# Patient Record
Sex: Male | Born: 1955
Health system: Southern US, Community
[De-identification: ages and names within clinical notes are randomized; demographics above are authoritative.]

## PROBLEM LIST (undated history)

## (undated) ENCOUNTER — Emergency Department (HOSPITAL_COMMUNITY): Disposition: A | Discharge status: Other Acute Inpatient Hospital | Payer: Medicaid Other

## (undated) DIAGNOSIS — G629 Polyneuropathy, unspecified: Secondary | ICD-10-CM

## (undated) DIAGNOSIS — B1920 Unspecified viral hepatitis C without hepatic coma: Secondary | ICD-10-CM

## (undated) DIAGNOSIS — M25512 Pain in left shoulder: Principal | ICD-10-CM

## (undated) DIAGNOSIS — F329 Major depressive disorder, single episode, unspecified: Secondary | ICD-10-CM

## (undated) DIAGNOSIS — M1990 Unspecified osteoarthritis, unspecified site: Secondary | ICD-10-CM

## (undated) DIAGNOSIS — G4700 Insomnia, unspecified: Secondary | ICD-10-CM

## (undated) DIAGNOSIS — C801 Malignant (primary) neoplasm, unspecified: Secondary | ICD-10-CM

## (undated) DIAGNOSIS — F1020 Alcohol dependence, uncomplicated: Secondary | ICD-10-CM

## (undated) DIAGNOSIS — G8929 Other chronic pain: Secondary | ICD-10-CM

## (undated) DIAGNOSIS — M8580 Other specified disorders of bone density and structure, unspecified site: Secondary | ICD-10-CM

## (undated) DIAGNOSIS — I10 Essential (primary) hypertension: Secondary | ICD-10-CM

## (undated) DIAGNOSIS — M25562 Pain in left knee: Secondary | ICD-10-CM

## (undated) DIAGNOSIS — F32A Depression, unspecified: Secondary | ICD-10-CM

## (undated) DIAGNOSIS — S8992XA Unspecified injury of left lower leg, initial encounter: Secondary | ICD-10-CM

## (undated) DIAGNOSIS — F1021 Alcohol dependence, in remission: Secondary | ICD-10-CM

## (undated) DIAGNOSIS — F17200 Nicotine dependence, unspecified, uncomplicated: Secondary | ICD-10-CM

## (undated) HISTORY — PX: OTHER SURGICAL HISTORY: SHX169

## (undated) HISTORY — DX: Nicotine dependence, unspecified, uncomplicated: F17.200

## (undated) HISTORY — PX: KNEE RECONSTRUCTION: SHX5883

## (undated) HISTORY — DX: Alcohol dependence, in remission: F10.21

## (undated) HISTORY — DX: Other chronic pain: G89.29

## (undated) HISTORY — DX: Pain in left knee: M25.562

## (undated) HISTORY — DX: Pain in left shoulder: M25.512

---

## 2002-08-10 ENCOUNTER — Inpatient Hospital Stay (HOSPITAL_COMMUNITY): Admission: EM | Admit: 2002-08-10 | Discharge: 2002-08-15 | Payer: Self-pay | Admitting: Psychiatry

## 2002-08-10 ENCOUNTER — Emergency Department (HOSPITAL_COMMUNITY): Admission: EM | Admit: 2002-08-10 | Discharge: 2002-08-10 | Payer: Self-pay | Admitting: Emergency Medicine

## 2003-06-01 ENCOUNTER — Emergency Department (HOSPITAL_COMMUNITY): Admission: EM | Admit: 2003-06-01 | Discharge: 2003-06-01 | Payer: Self-pay

## 2005-05-01 ENCOUNTER — Emergency Department (HOSPITAL_COMMUNITY): Admission: EM | Admit: 2005-05-01 | Discharge: 2005-05-01 | Payer: Self-pay | Admitting: Emergency Medicine

## 2007-05-08 ENCOUNTER — Emergency Department (HOSPITAL_COMMUNITY): Admission: EM | Admit: 2007-05-08 | Discharge: 2007-05-08 | Payer: Self-pay | Admitting: Emergency Medicine

## 2010-09-03 ENCOUNTER — Emergency Department (HOSPITAL_COMMUNITY): Admission: EM | Admit: 2010-09-03 | Discharge: 2010-09-03 | Payer: Self-pay | Admitting: Family Medicine

## 2010-09-05 ENCOUNTER — Emergency Department (HOSPITAL_COMMUNITY): Admission: EM | Admit: 2010-09-05 | Discharge: 2010-09-05 | Payer: Self-pay | Admitting: Emergency Medicine

## 2011-04-09 NOTE — Discharge Summary (Signed)
NAME:  Kyle Mejia, CLINGERMAN NO.:  [id number]   MEDICAL RECORD NO.:  [id number]                   PATIENT TYPE:  IPS   LOCATION:  0504                                 FACILITY:  [facility]   PHYSICIAN:  Jeanice Lim, M.D.              DATE OF BIRTH:  05/18/1956   DATE OF ADMISSION:  08/10/2002  DATE OF DISCHARGE:  08/15/2002                                 DISCHARGE SUMMARY   IDENTIFYING DATA:  This is a 55 year old divorced male admitted for detox,  drinking a fifth of vodka per day and also describing depressive symptoms  and decreased appetite and sleep.   MEDICATIONS:  None.   ALLERGIES:  No known drug allergies.   PHYSICAL EXAMINATION:  Essentially within normal limits.  Neurologically  nonfocal.   LABORATORY DATA:  Routine admission labs essentially within normal limits.   MENTAL STATUS EXAM:  Strongly-built white male with good eye contact.  Speech within normal limits.  Mood depressed.  Affect blunted.  Thought  process goal directed.  Thought content negative for suicidal or homicidal  ideation or psychotic symptoms.  Cognitively intact.  Judgment and insight  fair.   ADMISSION DIAGNOSES:   AXIS I:  1. Depressive disorder not otherwise specified.  2. Alcohol dependence.   AXIS II:  None.   AXIS III:  Left knee pain.   AXIS IV:  Moderate (problems related to limited support system and medical  problems).   AXIS V:  40/70.   HOSPITAL COURSE:  The patient was admitted and ordered routine p.r.n.  medications and underwent further monitoring.  He was encouraged to  participate in individual, group and milieu therapy.  The patient was placed  on phenobarbital detox protocol with monitoring for safe withdrawal and  started on Lexapro targeting depressive symptoms.  The patient tolerated  detox without complications, participated fully in treatment programming and  reported some improvement of mood.   CONDITION ON DISCHARGE:  Markedly  improved.  Mood was more euthymic.  Affect  brighter.  Thought processes goal directed.  Thought content negative for  dangerous ideation or psychotic symptoms.  The patient denied any acute  withdrawal symptoms and reported motivation to be abstinent from alcohol.   DISCHARGE MEDICATIONS:  1. Trazodone 100 mg, 2 q.h.s.  2. Lexapro 10 mg q.a.m.   FOLLOW UP:  Dr. Lourdes Sledge at [facility] on August 28, 2002 at  3:30 p.m. and Glendell Docker on August 27, 2002 at 11 a.m.   DISCHARGE DIAGNOSES:   AXIS I:  1. Depressive disorder not otherwise specified.  2. Alcohol dependence.   AXIS II:  None.   AXIS III:  Left knee pain.   AXIS IV:  Moderate (problems related to limited support system and medical  problems).   AXIS V:  Global Assessment of Functioning on discharge 55.  Jeanice Lim, M.D.    JEM/MEDQ  D:  10/01/2002  T:  10/01/2002  Job:  [id number]

## 2011-04-09 NOTE — H&P (Signed)
NAME:  Kyle Mejia, Kyle Mejia NO.:  [id number]   MEDICAL RECORD NO.:  [id number]                   PATIENT TYPE:  IPS   LOCATION:  0504                                 FACILITY:  [facility]   PHYSICIAN:  Hipolito Bayley, M.D.               DATE OF BIRTH:  December 04, 1955   DATE OF ADMISSION:  08/10/2002  DATE OF DISCHARGE:                         PSYCHIATRIC ADMISSION ASSESSMENT   INTRODUCTION:  The patient is a 55 year old white divorced male who was  admitted on voluntary papers with chief complaint of I want to stop  drinking.   REASON FOR ADMISSION AND SYMPTOMS:  For several years, patient gradually  increased amount of drinking.  Most recently, he drinks up to one-fifth of  vodka per day and he sees how this habit started affecting his job  performance.  He also complained of feeling depressed, down, no fun, no  energy and no motivation to do things.  Denies hallucinations.  Denied  suicidal or homicidal thoughts.  Denied any psychotic experience.  Complained of decreased appetite and sleep.   PAST PSYCHIATRIC HISTORY:  The patient has never been psychiatrically  hospitalized.  In the past, eight years ago, he was in a detoxification and  rehabilitation program and was able to stay sober for several years.  The  patient does not have history of suicidal behavior and no history of  psychiatric treatment.   SOCIAL HISTORY:  The patient is divorced with no children.  He works as an  Personnel officer.  He comes from a loving, close-knit family.  He has two older  sisters who are very supportive for his effort to become sober again.   FAMILY HISTORY:  Negative for mental illness or drug and alcohol problems.   ALCOHOL/DRUG HISTORY:  The patient started drinking on and off since early  20s.  He has history of alcohol withdrawal.  He never did any drugs.   MEDICAL HISTORY:  The patient is under care of family practice.  He has bad  left knee with recurrent pain  requiring further diagnosis.  He kept  postponing dealing with this problem due to his alcohol problems.   MEDICATIONS:  He takes over-the-counter pain relievers.   ALLERGIES:  He is not allergic to any known medication.   POSITIVE PHYSICAL FINDINGS:  Normal vital signs.  Physical examination, done  in the emergency department, was normal.   LABORATORY DATA:  Basic lab work was normal.   MENTAL STATUS EXAM:  Strongly-built white male with good eye contact.  Normal speech.  Denies hallucinations.  Mood depressed.  Affect blunted.  Thoughts organized, goal directed.  Content did not reveal suicidal,  homicidal or any other dangerous ideations.  No signs of psychosis.  Alert,  oriented x 3 with fair memory and concentration.  He seemed to be a sincere  and reliable historian.   Interview with the patient does bring history of obsessive-compulsive  disorder.  He, however,  mentioned about history of mood instability which is  predominantly low moods but periods of time of normal or slightly elevated  moods.   DIAGNOSTIC IMPRESSION:   AXIS I:  1. Depressive disorder not otherwise specified.  2. Rule out bipolar disorder, type 2.  3. Alcohol dependence.   AXIS II:  No diagnosis.   AXIS III:  Left knee pain, for further observation.   AXIS IV:  Moderate (problems related to social environment and addiction,  medical problems, chronic pain).   AXIS V:  Global Assessment of Functioning at present 50; maximum for past  year 80.   PLAN:  The patient seemed to be motivated to stay sober.  Will enroll him  into phenobarbital detoxification program.  Will order additional blood  work.  Will start on 10 mg of Lexapro daily to deal with symptoms of  depression.  The patient is interested in rehabilitation after discharge.  Will ask caseworker to investigate option for rehabilitation.  The patient  agreed with this plan.                                               Hipolito Bayley,  M.D.    JS/MEDQ  D:  08/13/2002  T:  08/14/2002  Job:  [phone]

## 2016-03-19 ENCOUNTER — Inpatient Hospital Stay (HOSPITAL_COMMUNITY)
Admission: AD | Admit: 2016-03-19 | Discharge: 2016-03-23 | DRG: 897 | Disposition: A | Discharge status: Home / Self Care | Payer: No Typology Code available for payment source | Source: Intra-hospital | Attending: Psychiatry | Admitting: Psychiatry

## 2016-03-19 ENCOUNTER — Encounter (HOSPITAL_COMMUNITY): Payer: Self-pay | Admitting: Emergency Medicine

## 2016-03-19 ENCOUNTER — Emergency Department (HOSPITAL_COMMUNITY)
Admission: EM | Admit: 2016-03-19 | Discharge: 2016-03-19 | Disposition: A | Discharge status: Other Acute Inpatient Hospital | Payer: Self-pay | Attending: Emergency Medicine | Admitting: Emergency Medicine

## 2016-03-19 ENCOUNTER — Encounter (HOSPITAL_COMMUNITY): Payer: Self-pay

## 2016-03-19 DIAGNOSIS — F1020 Alcohol dependence, uncomplicated: Secondary | ICD-10-CM | POA: Diagnosis present

## 2016-03-19 DIAGNOSIS — R45851 Suicidal ideations: Secondary | ICD-10-CM | POA: Diagnosis present

## 2016-03-19 DIAGNOSIS — F17210 Nicotine dependence, cigarettes, uncomplicated: Secondary | ICD-10-CM | POA: Insufficient documentation

## 2016-03-19 DIAGNOSIS — M25562 Pain in left knee: Secondary | ICD-10-CM | POA: Diagnosis present

## 2016-03-19 DIAGNOSIS — F419 Anxiety disorder, unspecified: Secondary | ICD-10-CM | POA: Diagnosis present

## 2016-03-19 DIAGNOSIS — G4700 Insomnia, unspecified: Secondary | ICD-10-CM | POA: Diagnosis present

## 2016-03-19 DIAGNOSIS — F329 Major depressive disorder, single episode, unspecified: Secondary | ICD-10-CM | POA: Diagnosis present

## 2016-03-19 DIAGNOSIS — F1010 Alcohol abuse, uncomplicated: Secondary | ICD-10-CM | POA: Insufficient documentation

## 2016-03-19 DIAGNOSIS — T1491XA Suicide attempt, initial encounter: Secondary | ICD-10-CM

## 2016-03-19 DIAGNOSIS — T1491 Suicide attempt: Secondary | ICD-10-CM | POA: Insufficient documentation

## 2016-03-19 LAB — COMPREHENSIVE METABOLIC PANEL
ALBUMIN: 2.6 g/dL — AB (ref 3.5–5.0)
ALK PHOS: 126 U/L (ref 38–126)
ALT: 66 U/L — ABNORMAL HIGH (ref 17–63)
ANION GAP: 10 (ref 5–15)
AST: 94 U/L — ABNORMAL HIGH (ref 15–41)
BILIRUBIN TOTAL: 1 mg/dL (ref 0.3–1.2)
BUN: 9 mg/dL (ref 6–20)
CALCIUM: 8.5 mg/dL — AB (ref 8.9–10.3)
CO2: 20 mmol/L — ABNORMAL LOW (ref 22–32)
CREATININE: 0.63 mg/dL (ref 0.61–1.24)
Chloride: 109 mmol/L (ref 101–111)
GFR calc Af Amer: >60 mL/min (ref >60)
GFR calc non Af Amer: >60 mL/min (ref >60)
GLUCOSE: 126 mg/dL — AB (ref 65–99)
Potassium: 3.6 mmol/L (ref 3.5–5.1)
Sodium: 139 mmol/L (ref 135–145)
TOTAL PROTEIN: 6.3 g/dL — AB (ref 6.5–8.1)

## 2016-03-19 LAB — RAPID URINE DRUG SCREEN, HOSP PERFORMED
Amphetamines: NOT DETECTED
BARBITURATES: NOT DETECTED
Benzodiazepines: NOT DETECTED
COCAINE: NOT DETECTED
Opiates: NOT DETECTED
Tetrahydrocannabinol: NOT DETECTED

## 2016-03-19 LAB — CBC
HEMATOCRIT: 43.1 % (ref 39.0–52.0)
HEMOGLOBIN: 14.5 g/dL (ref 13.0–17.0)
MCH: 34.7 pg — ABNORMAL HIGH (ref 26.0–34.0)
MCHC: 33.6 g/dL (ref 30.0–36.0)
MCV: 103.1 fL — ABNORMAL HIGH (ref 78.0–100.0)
Platelets: 105 10*3/uL — ABNORMAL LOW (ref 150–400)
RBC: 4.18 MIL/uL — AB (ref 4.22–5.81)
RDW: 13.2 % (ref 11.5–15.5)
WBC: 7.2 10*3/uL (ref 4.0–10.5)

## 2016-03-19 LAB — ETHANOL: Alcohol, Ethyl (B): 273 mg/dL — ABNORMAL HIGH (ref <5)

## 2016-03-19 MED ORDER — NICOTINE 21 MG/24HR TD PT24
21.0000 mg | MEDICATED_PATCH | Freq: Every day | TRANSDERMAL | Status: DC
  Administered 2016-03-20: 21 mg via TRANSDERMAL
  Filled 2016-03-19 (×3): qty 1

## 2016-03-19 MED ORDER — LORAZEPAM 1 MG PO TABS
1.0000 mg | ORAL_TABLET | Freq: Four times a day (QID) | ORAL | Status: AC
  Administered 2016-03-19 – 2016-03-20 (×6): 1 mg via ORAL
  Filled 2016-03-19 (×6): qty 1

## 2016-03-19 MED ORDER — ACETAMINOPHEN 325 MG PO TABS
650.0000 mg | ORAL_TABLET | Freq: Four times a day (QID) | ORAL | Status: DC | PRN
  Administered 2016-03-20: 650 mg via ORAL
  Filled 2016-03-19: qty 2

## 2016-03-19 MED ORDER — ADULT MULTIVITAMIN W/MINERALS CH
1.0000 | ORAL_TABLET | Freq: Every day | ORAL | Status: DC
  Administered 2016-03-19 – 2016-03-23 (×5): 1 via ORAL
  Filled 2016-03-19 (×8): qty 1

## 2016-03-19 MED ORDER — ALUM & MAG HYDROXIDE-SIMETH 200-200-20 MG/5ML PO SUSP: 30.0000 mL | ORAL | Status: DC | PRN

## 2016-03-19 MED ORDER — MAGNESIUM HYDROXIDE 400 MG/5ML PO SUSP: 30.0000 mL | Freq: Every day | ORAL | Status: DC | PRN

## 2016-03-19 MED ORDER — LOPERAMIDE HCL 2 MG PO CAPS: 2.0000 mg | ORAL_CAPSULE | ORAL | Status: AC | PRN

## 2016-03-19 MED ORDER — HYDROXYZINE HCL 25 MG PO TABS
25.0000 mg | ORAL_TABLET | Freq: Four times a day (QID) | ORAL | Status: AC | PRN
  Administered 2016-03-20 (×2): 25 mg via ORAL
  Filled 2016-03-19 (×2): qty 1

## 2016-03-19 MED ORDER — LORAZEPAM 1 MG PO TABS
1.0000 mg | ORAL_TABLET | Freq: Two times a day (BID) | ORAL | Status: AC
  Administered 2016-03-22 (×2): 1 mg via ORAL
  Filled 2016-03-19 (×2): qty 1

## 2016-03-19 MED ORDER — THIAMINE HCL 100 MG/ML IJ SOLN
100.0000 mg | Freq: Once | INTRAMUSCULAR | Status: AC
  Administered 2016-03-19: 100 mg via INTRAMUSCULAR
  Filled 2016-03-19: qty 2

## 2016-03-19 MED ORDER — TRAZODONE HCL 50 MG PO TABS
50.0000 mg | ORAL_TABLET | Freq: Every day | ORAL | Status: DC
  Administered 2016-03-19 – 2016-03-22 (×4): 50 mg via ORAL
  Filled 2016-03-19 (×5): qty 1
  Filled 2016-03-19: qty 14
  Filled 2016-03-19: qty 1

## 2016-03-19 MED ORDER — LORAZEPAM 1 MG PO TABS
1.0000 mg | ORAL_TABLET | Freq: Three times a day (TID) | ORAL | Status: AC
  Administered 2016-03-21 (×3): 1 mg via ORAL
  Filled 2016-03-19 (×3): qty 1

## 2016-03-19 MED ORDER — ONDANSETRON 4 MG PO TBDP: 4.0000 mg | ORAL_TABLET | Freq: Four times a day (QID) | ORAL | Status: AC | PRN

## 2016-03-19 MED ORDER — LORAZEPAM 1 MG PO TABS: 1.0000 mg | ORAL_TABLET | Freq: Four times a day (QID) | ORAL | Status: AC | PRN

## 2016-03-19 MED ORDER — LORAZEPAM 1 MG PO TABS
1.0000 mg | ORAL_TABLET | Freq: Every day | ORAL | Status: AC
  Administered 2016-03-23: 1 mg via ORAL
  Filled 2016-03-19: qty 1

## 2016-03-19 MED ORDER — VITAMIN B-1 100 MG PO TABS
100.0000 mg | ORAL_TABLET | Freq: Every day | ORAL | Status: DC
  Administered 2016-03-20 – 2016-03-23 (×4): 100 mg via ORAL
  Filled 2016-03-19 (×6): qty 1

## 2016-03-19 NOTE — Progress Notes (Signed)
D: Patient denies SI, HI or AVH. Patient awakened from sleep to introduce self.  Pt. Pleasant and cooperative.  Pt. Denies any needs or complaints at this time.    A: Patient given emotional support from RN. Patient encouraged to come to staff with concerns and/or questions. Patient's medication routine continued. Patient's orders and plan of care reviewed.   R: Patient remains appropriate and cooperative. Will continue to monitor patient q15 minutes for safety.

## 2016-03-19 NOTE — Progress Notes (Signed)
Patient ID: Kyle Mejia, male   DOB: October 21, 1956, 60 y.o.   MRN: [id number] Patient was admitted to the unit due in desire to detox from ETOH.  Patient stated that alcohol has taken up such a big part of his life that he has been unable to function without it.  Patient reports drinking at lease an 18 pack of beer a day and about a the of hard liquor a day.  Patient denies SI, HI and AVH but reports feeling fidgety due to withdrawals.  Skin assessment was complete and patient acknowledge patient treatment agreement and signed.  Patient was oriented to unit.

## 2016-03-19 NOTE — ED Notes (Signed)
Pt states he is here because he has been drinking daily and uses to get some resources to help him stop. Pt states his last drink was this morning. Pt states he drinks, 'beer and liquor as much as he can get." Pt states he has been having thoughts about hanging himself. Pt states he has tried to " drink himself to death, but not able to."

## 2016-03-19 NOTE — ED Notes (Signed)
TTS at bedside. 

## 2016-03-19 NOTE — Progress Notes (Signed)
Pt accepted to [facility] bed 300-1, attending Dr. Sabra Heck. Report # is [phone] Pt can be transferred anytime per [facility].  Sharren Bridge, MSW, LCSW Clinical Social Work, Disposition  03/19/2016 [phone]

## 2016-03-19 NOTE — Tx Team (Signed)
Initial Interdisciplinary Treatment Plan   PATIENT STRESSORS: Substance abuse   PATIENT STRENGTHS: Ability for insight Capable of independent living Supportive family/friends   PROBLEM LIST: Problem List/Patient Goals Date to be addressed Date deferred Reason deferred Estimated date of resolution  Alcohol Abuse      Withdrawals                                                 DISCHARGE CRITERIA:  Withdrawal symptoms are absent or subacute and managed without 24-hour nursing intervention  PRELIMINARY DISCHARGE PLAN: Return to previous living arrangement Return to previous work or school arrangements  PATIENT/FAMIILY INVOLVEMENT: This treatment plan has been presented to and reviewed with the patient, Kyle Mejia, and/or family member  The patient and family have been given the opportunity to ask questions and make suggestions.  Clarita Crane 03/19/2016, 6:33 PM

## 2016-03-19 NOTE — ED Notes (Signed)
Pt given [place] sandwich bag and water per verbal order from EDP.

## 2016-03-19 NOTE — ED Provider Notes (Signed)
CSN: [id number]     Arrival date & time 03/19/16  1031 History   First MD Initiated Contact with Patient 03/19/16 1137     Chief Complaint  Patient presents with  . Alcohol Problem     (Consider location/radiation/quality/duration/timing/severity/associated sxs/prior Treatment) Patient is a 60 y.o. male presenting with alcohol problem. The history is provided by the patient and the spouse.  Alcohol Problem This is a chronic problem. The current episode started more than 1 year ago. The problem occurs constantly. The problem has been unchanged. Pertinent negatives include no abdominal pain, chest pain, coughing, fever, nausea or vomiting. Nothing aggravates the symptoms. He has tried nothing for the symptoms. The treatment provided no relief.   Kyle Mejia is a 60 y.o. male with PMH significant for EtOH abuse who presents requesting help for alcohol abuse and suicidal ideation.  Patient reports he has been drinking "a long time".  Patient reports the last time he was sober was over 20 years ago for a period of 6 months.  He reports he drinks whatever he can get his hands on, "beer and liquor, as much as I can get".  He drinks daily, with the last drink the AM.  He reports he had 16 oz malt liquor.  He reports a couple of days ago he attempted suicide.  He reports he tried to hang himself, but was not successful.  Wife reports this is the first time he has opened up about his feelings.  He has used resources in the past such as AA, but has not followed through.  He keeps stating "I don't want to be a burden, and I feel worthless". No other complaints at this time.   History reviewed. No pertinent past medical history. Past Surgical History  Procedure Laterality Date  . Joint replacement     No family history on file. Social History  Substance Use Topics  . Smoking status: Current Every Day Smoker -- 1.00 packs/day    Types: Cigarettes  . Smokeless tobacco: None  . Alcohol Use: Yes      Comment: daily    Review of Systems  Constitutional: Negative for fever.  Respiratory: Negative for cough and shortness of breath.   Cardiovascular: Negative for chest pain.  Gastrointestinal: Negative for nausea, vomiting and abdominal pain.  Psychiatric/Behavioral: Positive for suicidal ideas and behavioral problems.  All other systems reviewed and are negative.     Allergies  Review of patient's allergies indicates no known allergies.  Home Medications   Prior to Admission medications   Not on File   BP 137/74 mmHg  Pulse 82  Temp(Src) 98.2 F (36.8 C) (Oral)  Resp 18  Ht 5\' 10"  (1.778 m)  Wt 86.183 kg  BMI 27.26 kg/m2  SpO2 97% Physical Exam  Constitutional: He is oriented to person, place, and time. He appears well-developed and well-nourished.  Non-toxic appearance. He does not have a sickly appearance. He does not appear ill.  HENT:  Head: Normocephalic and atraumatic.  Mouth/Throat: Oropharynx is clear and moist.  Eyes: Conjunctivae are normal.  Neck: Normal range of motion. Neck supple.  Cardiovascular: Normal rate, regular rhythm and normal heart sounds.   No murmur heard. Pulmonary/Chest: Effort normal and breath sounds normal. No accessory muscle usage or stridor. No respiratory distress. He has no wheezes. He has no rhonchi. He has no rales.  Abdominal: Soft. Bowel sounds are normal. He exhibits no distension. There is no tenderness.  Musculoskeletal: Normal range of motion.  Lymphadenopathy:    He has no cervical adenopathy.  Neurological: He is alert and oriented to person, place, and time.  Speech clear without dysarthria.  Skin: Skin is warm and dry.  Psychiatric: His behavior is normal. He exhibits a depressed mood. He expresses suicidal ideation. He expresses no homicidal ideation. He expresses suicidal plans. He expresses no homicidal plans.  Patient cooperative and tearful at times.      ED Course  Procedures (including critical care  time) Labs Review Labs Reviewed  COMPREHENSIVE METABOLIC PANEL - Abnormal; Notable for the following:    CO2 20 (*)    Glucose, Bld 126 (*)    Calcium 8.5 (*)    Total Protein 6.3 (*)    Albumin 2.6 (*)    AST 94 (*)    ALT 66 (*)    All other components within normal limits  ETHANOL - Abnormal; Notable for the following:    Alcohol, Ethyl (B) 273 (*)    All other components within normal limits  CBC - Abnormal; Notable for the following:    RBC 4.18 (*)    MCV 103.1 (*)    MCH 34.7 (*)    Platelets 105 (*)    All other components within normal limits  URINE RAPID DRUG SCREEN, HOSP PERFORMED    Imaging Review No results found. I have personally reviewed and evaluated these images and lab results as part of my medical decision-making.   EKG Interpretation None      MDM   Final diagnoses:  Alcohol abuse  Suicidal ideation  Suicide attempt [facility])   Patient presents requesting help with alcohol and suicidal ideation and suicide attempt.  No other complaints.  VSS, NAD.  Ethanol 273.  AST/ALT elevated, low albumin, likely due to chronic EtOH consumption.  Otherwise, no acute abnormalities. CIWA protocol ordered.  TTS has been consulted for appropriate disposition.    Gloriann Loan, PA-C 03/19/16 1457  Gareth Morgan, MD 03/21/16 [phone]

## 2016-03-19 NOTE — BH Assessment (Addendum)
Tele Assessment Note   Kyle Mejia is an 60 y.o. male who voluntarily presents to [facility] with c/o SI and Alcohol abuse. Pt indicates that he has been drinking daily for over 20 years. Pt was tearful, as he conveyed his feelings of not being worth it and his recent thoughts of suicide. Pt shared that he had a plan to hang himself 2 days ago, but didn't go thru with it b/c he thought of all of the people he would be hurting. Pt denies having HI or AVH, but endorses having current SI. Pt presented as clear and cogent in his speech, although his BAL was at 273.   Diagnosis: MDD, single episode, severe; Alcohol Use Disorder, severe  Past Medical History: History reviewed. No pertinent past medical history.  Past Surgical History  Procedure Laterality Date  . Joint replacement      Family History: No family history on file.  Social History:  reports that he has been smoking Cigarettes.  He has been smoking about 1.00 pack per day. He does not have any smokeless tobacco history on file. He reports that he drinks alcohol. His drug history is not on file.  Additional Social History:  Alcohol / Drug Use Pain Medications: pt denies Prescriptions: pt denies Over the Counter: pt denies History of alcohol / drug use?: Yes Longest period of sobriety (when/how long): @6  months sober over 20 years ago Substance #1 Name of Substance 1: Alcohol 1 - Age of First Use: been using over 20 years 1 - Amount (size/oz): "as much as I can get" of beer and/or alcohol 1 - Frequency: daily 1 - Duration: ongoing 1 - Last Use / Amount: this morning/16 ounce malt beverage  CIWA: CIWA-Ar BP: 135/77 mmHg Pulse Rate: 91 COWS:    PATIENT STRENGTHS: (choose at least two) Average or above average intelligence Capable of independent living Motivation for treatment/growth Supportive family/friends  Allergies: No Known Allergies  Home Medications:  (Not in a hospital admission)  OB/GYN Status:  No LMP for  male patient.  General Assessment Data Location of Assessment: [facility] ED TTS Assessment: In system Is this a Tele or Face-to-Face Assessment?: Tele Assessment Is this an Initial Assessment or a Re-assessment for this encounter?: Initial Assessment Marital status: Divorced Is patient pregnant?: No Pregnancy Status: No Living Arrangements: Alone Can pt return to current living arrangement?: Yes Admission Status: Voluntary Is patient capable of signing voluntary admission?: Yes Referral Source: Self/Family/Friend Insurance type: none  Medical Screening Exam ([place]) Medical Exam completed: Yes  Crisis Care Plan Living Arrangements: Alone Name of Psychiatrist: none Name of Therapist: none  Education Status Is patient currently in school?: No  Risk to self with the past 6 months Suicidal Ideation: Yes-Currently Present Has patient been a risk to self within the past 6 months prior to admission? : No Suicidal Intent: No-Not Currently/Within Last 6 Months Has patient had any suicidal intent within the past 6 months prior to admission? : Yes Is patient at risk for suicide?: Yes Suicidal Plan?: No-Not Currently/Within Last 6 Months Has patient had any suicidal plan within the past 6 months prior to admission? : Yes Access to Means: Yes Specify Access to Suicidal Means: pt has access to items to hang himself What has been your use of drugs/alcohol within the last 12 months?: see above Previous Attempts/Gestures: Yes How many times?: 1 Other Self Harm Risks: excessive drinking Triggers for Past Attempts: Unknown Intentional Self Injurious Behavior: None Family Suicide History: No  Recent stressful life event(s): Other (Comment) (can't stop drinking) Persecutory voices/beliefs?: No Depression: Yes Depression Symptoms: Tearfulness, Guilt, Feeling worthless/self pity Substance abuse history and/or treatment for substance abuse?: Yes (pt went to [facility] over 20 years  ago) Suicide prevention information given to non-admitted patients: Not applicable  Risk to Others within the past 6 months Homicidal Ideation: No Does patient have any lifetime risk of violence toward others beyond the six months prior to admission? : No Thoughts of Harm to Others: No Current Homicidal Intent: No Current Homicidal Plan: No Access to Homicidal Means: No History of harm to others?: No Assessment of Violence: None Noted Violent Behavior Description: none noted Does patient have access to weapons?: No Criminal Charges Pending?: No Does patient have a court date: No Is patient on probation?: No  Psychosis Hallucinations: None noted Delusions: None noted  Mental Status Report Appearance/Hygiene: Unremarkable Eye Contact: Fair Motor Activity: Unremarkable Speech: Logical/coherent Level of Consciousness: Alert Mood: Depressed, Ashamed/humiliated Affect: Appropriate to circumstance Anxiety Level: None Thought Processes: Coherent, Relevant Judgement: Partial Orientation: Person, Place, Time, Situation, Appropriate for developmental age Obsessive Compulsive Thoughts/Behaviors: None  Cognitive Functioning Concentration: Normal Memory: Recent Intact, Remote Intact IQ: Average Insight: see judgement above Impulse Control: Fair Appetite: Fair Sleep: No Change Vegetative Symptoms: None  ADLScreening [facility] Assessment Services) Patient's cognitive ability adequate to safely complete daily activities?: Yes Patient able to express need for assistance with ADLs?: Yes Independently performs ADLs?: Yes (appropriate for developmental age)  Prior Inpatient Therapy Prior Inpatient Therapy: Yes Prior Therapy Dates: over 20 years ago Prior Therapy Facilty/Provider(s): pt cannot remember Reason for Treatment: alcohol abuse  Prior Outpatient Therapy Prior Outpatient Therapy: No Does patient have an ACCT team?: No Does patient have Intensive In-House Services?  :  No Does patient have [facility] services? : No Does patient have P4CC services?: No  ADL Screening (condition at time of admission) Patient's cognitive ability adequate to safely complete daily activities?: Yes Is the patient deaf or have difficulty hearing?: No Does the patient have difficulty seeing, even when wearing glasses/contacts?: No Does the patient have difficulty concentrating, remembering, or making decisions?: No Patient able to express need for assistance with ADLs?: Yes Does the patient have difficulty dressing or bathing?: No Independently performs ADLs?: Yes (appropriate for developmental age) Does the patient have difficulty walking or climbing stairs?: No Weakness of Legs: None Weakness of Arms/Hands: None  Home Assistive Devices/Equipment Home Assistive Devices/Equipment: None  Therapy Consults (therapy consults require a physician order) PT Evaluation Needed: No OT Evalulation Needed: No SLP Evaluation Needed: No Abuse/Neglect Assessment (Assessment to be complete while patient is alone) Physical Abuse: Denies Verbal Abuse: Denies Sexual Abuse: Denies Exploitation of patient/patient's resources: Denies Self-Neglect: Denies Values / Beliefs Cultural Requests During Hospitalization: None Spiritual Requests During Hospitalization: None Consults Spiritual Care Consult Needed: No Social Work Consult Needed: No Regulatory affairs officer (For Healthcare) Does patient have an advance directive?: No Would patient like information on creating an advanced directive?: No - patient declined information    Additional Information 1:1 In Past 12 Months?: No CIRT Risk: No Elopement Risk: No Does patient have medical clearance?: Yes     Disposition:  Disposition Initial Assessment Completed for this Encounter: Yes Disposition of Patient: Inpatient treatment program (consulted with Elmarie Shiley, NP) Type of inpatient treatment program: Adult (accepted to [facility]  300-1)  Rexene Edison 03/19/2016 1:47 PM

## 2016-03-19 NOTE — ED Notes (Signed)
Staffing called for sitter.   

## 2016-03-20 DIAGNOSIS — F1020 Alcohol dependence, uncomplicated: Principal | ICD-10-CM

## 2016-03-20 MED ORDER — IBUPROFEN 600 MG PO TABS
600.0000 mg | ORAL_TABLET | Freq: Four times a day (QID) | ORAL | Status: DC | PRN
  Administered 2016-03-20 – 2016-03-22 (×4): 600 mg via ORAL
  Filled 2016-03-20 (×4): qty 1

## 2016-03-20 MED ORDER — NICOTINE POLACRILEX 2 MG MT GUM
2.0000 mg | CHEWING_GUM | OROMUCOSAL | Status: DC | PRN
  Administered 2016-03-20 (×2): 2 mg via ORAL
  Filled 2016-03-20: qty 1

## 2016-03-20 NOTE — Progress Notes (Signed)
Adult Psychoeducational Group Note  Date:  03/20/2016 Time:  9:10 PM  Group Topic/Focus:  Wrap-Up Group:   The focus of this group is to help patients review their daily goal of treatment and discuss progress on daily workbooks.  Participation Level:  Active  Participation Quality:  Appropriate and Attentive  Affect:  Appropriate  Cognitive:  Appropriate  Insight: Appropriate and Good  Engagement in Group:  Engaged  Modes of Intervention:  Discussion  Additional Comments:  Pt stated his goal was to get up and wake up. Pt stated something positive is that is girlfriend came to visit and he did not run out of here because he wanted to drink.  Clint Bolder 03/20/2016, 9:10 PM

## 2016-03-20 NOTE — BHH Suicide Risk Assessment (Signed)
[facility] Admission Suicide Risk Assessment   Nursing information obtained from:  Patient Demographic factors:  Male, Caucasian, lives alone Current Mental Status:  Alcohol withdrawal Loss Factors:  NA Historical Factors:  NA Risk Reduction Factors:  Positive social support  Total Time spent with patient: 1 hour Principal Problem: <principal problem not specified> Diagnosis:   Patient Active Problem List   Diagnosis Date Noted  . Alcohol use disorder, severe, dependence ([place]) [F10.20] 03/19/2016   Subjective Data: Pt reports he drinks alcohol all day and all night. He drinks 1 case of beer and more liquor daily. He starts withdrawing with 8 hrs after his last drink and today reports his last drink was yesterday. Currently reports decreased energy but no other withdrawal symptoms. Denies depression and anxiety. Denies SI/HI/AVH. States he is not taking any meds right now. He detoxed once 20 yrs ago. Denies any hx of DT's.   Continued Clinical Symptoms:  Alcohol Use Disorder Identification Test Final Score (AUDIT): 36 The "Alcohol Use Disorders Identification Test", Guidelines for Use in Primary Care, Second Edition.  World Pharmacologist [place] [facility]). Score between 0-7:  no or low risk or alcohol related problems. Score between 8-15:  moderate risk of alcohol related problems. Score between 16-19:  high risk of alcohol related problems. Score 20 or above:  warrants further diagnostic evaluation for alcohol dependence and treatment.   CLINICAL FACTORS:   Alcohol/Substance Abuse/Dependencies   Musculoskeletal: Strength & Muscle Tone: within normal limits Gait & Station: normal Patient leans: N/A  Psychiatric Specialty Exam: Review of Systems  Constitutional: Positive for malaise/fatigue.  Cardiovascular: Negative for chest pain.  Gastrointestinal: Negative for heartburn, nausea and vomiting. h Musculoskeletal: Positive for joint pain. Negative for back pain and neck pain.   Neurological: Negative for dizziness.  Psychiatric/Behavioral: Positive for substance abuse. Negative for depression and suicidal ideas. The patient is not nervous/anxious.     Blood pressure 122/74, pulse 55, temperature 97.6 F (36.4 C), temperature source Oral, resp. rate 20.There is no weight on file to calculate BMI.  General Appearance: Disheveled  Eye Sport and exercise psychologist::  Fair  Speech:  Clear and Coherent and Slow  Volume:  Decreased  Mood:  Euthymic  Affect:  Blunt  Thought Process:  Goal Directed  Orientation:  Full (Time, Place, and Person)  Thought Content:  Negative  Suicidal Thoughts:  No  Homicidal Thoughts:  No  Memory:  Immediate;   Good Recent;   Good Remote;   Good  Judgement:  Intact  Insight:  Present  Psychomotor Activity:  Normal  Concentration:  Fair  Recall:  [employer] of Knowledge:Fair  Language: Good  Akathisia:  No  Handed:  Right  AIMS (if indicated):     Assets:  Communication Skills Desire for Improvement  Sleep:  Number of Hours: 6.75  Cognition: WNL  ADL's:  Intact    COGNITIVE FEATURES THAT CONTRIBUTE TO RISK:  Closed-mindedness    SUICIDE RISK:   Mild:  Suicidal ideation of limited frequency, intensity, duration, and specificity.  There are no identifiable plans, no associated intent, mild dysphoria and related symptoms, good self-control (both objective and subjective assessment), few other risk factors, and identifiable protective factors, including available and accessible social support.  PLAN OF CARE: Admit and start on alcohol detox protocol with Ativan as pt has elevated liver enzymes.    I certify that inpatient services furnished can reasonably be expected to improve the patient's condition.   Charlcie Cradle, MD 03/20/2016, 9:18 AM

## 2016-03-20 NOTE — Progress Notes (Signed)
D:  Patient's self inventory sheet, patient has fair sleep, sleep medication is helpful.  Good appetite, low energy level, good concentration.  Rated depression, hopeless and anxiety #4.  Withdrawals, cravings, agitation.  Denied SI.  Denied physical problems.  Physical pain #6, knee, no pain medication.  "Don't feel normal, real tired.  Get rest."  No discharge plans. A:  Medications administered per MD orders.  Emotional support and encouragement given patient. R:  Denied SI and HI, contracts for safety.  Denied A/V hallucinations.  Safety maintained with 15 minute checks.

## 2016-03-20 NOTE — BHH Group Notes (Addendum)
The focus of this group is to educate the patient on the purpose and policies of crisis stabilization and provide a format to answer questions about their admission.  The group details unit policies and expectations of patients while admitted.  Patient did not attend 0900 nurse education orientation group this morning.  Patient stayed in bed.   

## 2016-03-20 NOTE — H&P (Signed)
Psychiatric Admission Assessment Adult  Patient Identification: Kyle Mejia MRN:  [id number] Date of Evaluation:  03/20/2016 Chief Complaint:  MDD,SINGLE EPISODE,SEVERE ALCOHOL USE DISORDER,SEVERE Principal Diagnosis: Alcohol use disorder, severe, dependence ([place]) Diagnosis:   Patient Active Problem List   Diagnosis Date Noted  . Alcohol use disorder, severe, dependence ([place]) [F10.20] 03/19/2016   History of Present Illness; Per Assessment Note:Kyle Mejia is an 60 y.o. male who voluntarily presents to [facility] with c/o SI and Alcohol abuse. Pt indicates that he has been drinking daily for over 20 years. Pt was tearful, as he conveyed his feelings of not being worth it and his recent thoughts of suicide. Pt shared that he had a plan to hang himself 2 days ago, but didn't go thru with it b/c he thought of all of the people he would be hurting. Pt denies having HI or AVH, but endorses having current SI. Pt presented as clear and cogent in his speech, although his BAL was at 273.  On evaluation:Kyle Mejia is awake, alert and oriented X3 , found resting in bedroom. Patient  denies suicidal or homicidal ideation at this time . Denies auditory or visual hallucination and does not appear to be responding to internal stimuli.  Patient validates that information that was provided in the admission assessment.  Patient report previous inpatient for detox . Patient denies taken medication on a daily basis. Patient reports some depression 2/10. Patient states "I have chronic knee pain." Reports good appetite and reports his is resting well.  Support, encouragement and reassurance was provided.   Associated Signs/Symptoms: Depression Symptoms:  depressed mood, insomnia, difficulty concentrating, anxiety, (Hypo) Manic Symptoms:  Impulsivity, Anxiety Symptoms:  Excessive Worry, Psychotic Symptoms:  Hallucinations: None PTSD Symptoms: NA Total Time spent with patient: 30 minutes  Past  Psychiatric History:  See Above  Is the patient at risk to self? Yes.    Has the patient been a risk to self in the past 6 months? Yes.    Has the patient been a risk to self within the distant past? No.  Is the patient a risk to others? No.  Has the patient been a risk to others in the past 6 months? No.  Has the patient been a risk to others within the distant past? No.   Prior Inpatient Therapy:   Prior Outpatient Therapy:    Alcohol Screening: 1. How often do you have a drink containing alcohol?: 4 or more times a week 2. How many drinks containing alcohol do you have on a typical day when you are drinking?: 10 or more 3. How often do you have six or more drinks on one occasion?: Daily or almost daily Preliminary Score: 8 4. How often during the last year have you found that you were not able to stop drinking once you had started?: Daily or almost daily 5. How often during the last year have you failed to do what was normally expected from you becasue of drinking?: Daily or almost daily 6. How often during the last year have you needed a first drink in the morning to get yourself going after a heavy drinking session?: Daily or almost daily 7. How often during the last year have you had a feeling of guilt of remorse after drinking?: Daily or almost daily 8. How often during the last year have you been unable to remember what happened the night before because you had been drinking?: Daily or almost daily 9. Have you or someone  else been injured as a result of your drinking?: No 10. Has a relative or friend or a doctor or another health worker been concerned about your drinking or suggested you cut down?: Yes, during the last year Alcohol Use Disorder Identification Test Final Score (AUDIT): 36 Brief Intervention: Yes Substance Abuse History in the last 12 months:  Yes.   Consequences of Substance Abuse: Withdrawal Symptoms:   Headaches Nausea Previous Psychotropic Medications:  NO Psychological Evaluations: NO Past Medical History: History reviewed. No pertinent past medical history.  Past Surgical History  Procedure Laterality Date  . Joint replacement     Family History: History reviewed. No pertinent family history. Family Psychiatric  History: Unknown Tobacco Screening: _0 ([phone])::1)@ Social History:  History  Alcohol Use  . Yes    Comment: daily     History  Drug Use Not on file    Additional Social History:                           Allergies:  No Known Allergies Lab Results:  Results for orders placed or performed during the hospital encounter of 03/19/16 (from the past 48 hour(s))  Comprehensive metabolic panel     Status: Abnormal   Collection Time: 03/19/16 11:27 AM  Result Value Ref Range   Sodium 139 135 - 145 mmol/L   Potassium 3.6 3.5 - 5.1 mmol/L   Chloride 109 101 - 111 mmol/L   CO2 20 (L) 22 - 32 mmol/L   Glucose, Bld 126 (H) 65 - 99 mg/dL   BUN 9 6 - 20 mg/dL   Creatinine, Ser 0.63 0.61 - 1.24 mg/dL   Calcium 8.5 (L) 8.9 - 10.3 mg/dL   Total Protein 6.3 (L) 6.5 - 8.1 g/dL   Albumin 2.6 (L) 3.5 - 5.0 g/dL   AST 94 (H) 15 - 41 U/L   ALT 66 (H) 17 - 63 U/L   Alkaline Phosphatase 126 38 - 126 U/L   Total Bilirubin 1.0 0.3 - 1.2 mg/dL   GFR calc non Af Amer >60 >60 mL/min   GFR calc Af Amer >60 >60 mL/min    Comment: (NOTE) The eGFR has been calculated using the CKD EPI equation. This calculation has not been validated in all clinical situations. eGFR's persistently <60 mL/min signify possible Chronic Kidney Disease.    Anion gap 10 5 - 15  CBC     Status: Abnormal   Collection Time: 03/19/16 11:27 AM  Result Value Ref Range   WBC 7.2 4.0 - 10.5 K/uL   RBC 4.18 (L) 4.22 - 5.81 MIL/uL   Hemoglobin 14.5 13.0 - 17.0 g/dL   HCT 43.1 39.0 - 52.0 %   MCV 103.1 (H) 78.0 - 100.0 fL   MCH 34.7 (H) 26.0 - 34.0 pg   MCHC 33.6 30.0 - 36.0 g/dL   RDW 13.2 11.5 - 15.5 %   Platelets 105 (L) 150 - 400 K/uL     Comment: PLATELET COUNT CONFIRMED BY SMEAR REPEATED TO VERIFY   Ethanol (ETOH)     Status: Abnormal   Collection Time: 03/19/16 11:28 AM  Result Value Ref Range   Alcohol, Ethyl (B) 273 (H) <5 mg/dL    Comment:        LOWEST DETECTABLE LIMIT FOR SERUM ALCOHOL IS 5 mg/dL FOR MEDICAL PURPOSES ONLY   Urine rapid drug screen (hosp performed) (Not at [facility])     Status: None   Collection Time: 03/19/16 11:30  AM  Result Value Ref Range   Opiates NONE DETECTED NONE DETECTED   Cocaine NONE DETECTED NONE DETECTED   Benzodiazepines NONE DETECTED NONE DETECTED   Amphetamines NONE DETECTED NONE DETECTED   Tetrahydrocannabinol NONE DETECTED NONE DETECTED   Barbiturates NONE DETECTED NONE DETECTED    Comment:        DRUG SCREEN FOR MEDICAL PURPOSES ONLY.  IF CONFIRMATION IS NEEDED FOR ANY PURPOSE, NOTIFY LAB WITHIN 5 DAYS.        LOWEST DETECTABLE LIMITS FOR URINE DRUG SCREEN Drug Class       Cutoff (ng/mL) Amphetamine      1000 Barbiturate      200 Benzodiazepine   939 Tricyclics       030 Opiates          300 Cocaine          300 THC              50     Blood Alcohol level:  Lab Results  Component Value Date   ETH 273* 08/14/3006    Metabolic Disorder Labs:  No results found for: HGBA1C, MPG No results found for: PROLACTIN No results found for: CHOL, TRIG, HDL, CHOLHDL, VLDL, LDLCALC  Current Medications: Current Facility-Administered Medications  Medication Dose Route Frequency Provider Last Rate Last Dose  . acetaminophen (TYLENOL) tablet 650 mg  650 mg Oral Q6H PRN Encarnacion Slates, NP   650 mg at 03/20/16 0814  . alum & mag hydroxide-simeth (MAALOX/MYLANTA) 200-200-20 MG/5ML suspension 30 mL  30 mL Oral Q4H PRN Encarnacion Slates, NP      . hydrOXYzine (ATARAX/VISTARIL) tablet 25 mg  25 mg Oral Q6H PRN Encarnacion Slates, NP      . loperamide (IMODIUM) capsule 2-4 mg  2-4 mg Oral PRN Encarnacion Slates, NP      . LORazepam (ATIVAN) tablet 1 mg  1 mg Oral Q6H PRN Encarnacion Slates, NP       . LORazepam (ATIVAN) tablet 1 mg  1 mg Oral QID Encarnacion Slates, NP   1 mg at 03/20/16 1151   Followed by  . [START ON 03/21/2016] LORazepam (ATIVAN) tablet 1 mg  1 mg Oral TID Encarnacion Slates, NP       Followed by  . [START ON 03/22/2016] LORazepam (ATIVAN) tablet 1 mg  1 mg Oral BID Encarnacion Slates, NP       Followed by  . [START ON 03/23/2016] LORazepam (ATIVAN) tablet 1 mg  1 mg Oral Daily Encarnacion Slates, NP      . magnesium hydroxide (MILK OF MAGNESIA) suspension 30 mL  30 mL Oral Daily PRN Encarnacion Slates, NP      . multivitamin with minerals tablet 1 tablet  1 tablet Oral Daily Encarnacion Slates, NP   1 tablet at 03/20/16 0807  . nicotine (NICODERM CQ - dosed in mg/24 hours) patch 21 mg  21 mg Transdermal Q0600 Encarnacion Slates, NP   21 mg at 03/20/16 0807  . ondansetron (ZOFRAN-ODT) disintegrating tablet 4 mg  4 mg Oral Q6H PRN Encarnacion Slates, NP      . thiamine (VITAMIN B-1) tablet 100 mg  100 mg Oral Daily Encarnacion Slates, NP   100 mg at 03/20/16 0807  . traZODone (DESYREL) tablet 50 mg  50 mg Oral QHS Encarnacion Slates, NP   50 mg at 03/19/16 2139   PTA Medications: No prescriptions prior to admission  Musculoskeletal: Strength & Muscle Tone: within normal limits Gait & Station: normal Patient leans: N/A  Psychiatric Specialty Exam: Physical Exam  Nursing note and vitals reviewed. Constitutional: He is oriented to person, place, and time. He appears well-developed.  HENT:  Head: Normocephalic.  Neck: Neck supple.  Cardiovascular: Normal rate.   Musculoskeletal: Normal range of motion.  Neurological: He is alert and oriented to person, place, and time.  Psychiatric: He has a normal mood and affect. His behavior is normal.    Review of Systems  Musculoskeletal: Positive for joint pain.       Patient reports right chronic keen pain  Psychiatric/Behavioral: Positive for depression and substance abuse. Negative for suicidal ideas. The patient has insomnia.   All other systems reviewed and are  negative.   Blood pressure 122/74, pulse 55, temperature 97.6 F (36.4 C), temperature source Oral, resp. rate 20.There is no weight on file to calculate BMI.  General Appearance: Casual  Eye Contact::  Good  Speech:  Clear and Coherent  Volume:  Normal  Mood:  Depressed  Affect:  Congruent  Thought Process:  Coherent and Linear  Orientation:  Full (Time, Place, and Person)  Thought Content:  Hallucinations: None  Suicidal Thoughts:  No patient denies at this time  Homicidal Thoughts:  No  Memory:  Immediate;   Fair Recent;   Fair Remote;   Fair  Judgement:  Intact  Insight:  Fair  Psychomotor Activity:  Restlessness  Concentration:  Fair  Recall:  [employer] of [place]: Fair  Akathisia:  No  Handed:  Right  AIMS (if indicated):     Assets:  Communication Skills Resilience Social Support  ADL's:  Intact  Cognition: WNL  Sleep:  Number of Hours: 6.75     I agree with current treatment plan on 04/292017, Patient seen face-to-face for psychiatric evaluation follow-up, chart reviewed and case discussed with the MD Doyne Keel, Advanced Practice Provider and Treatment team. Reviewed the information documented and agree with the treatment plan.  Treatment Plan Summary: Daily contact with patient to assess and evaluate symptoms and progress in treatment and Medication management  Continue with Trazodone 50 mg for insomnia Started on CWIA/ Ativan Protocol Will continue to monitor vitals ,medication compliance and treatment side effects while patient is here.  Reviewed labs Glucose 126 elevated ,BAL - 273, UDS -  CSW will start working on disposition.  Patient to participate in therapeutic milieu   Observation Level/Precautions:  15 minute checks  Laboratory:  CBC Chemistry Profile HbAIC UDS UA  Psychotherapy:  Individual and group session  Medications:  continue with Ativan protocol   Consultations:  Psychiatry/ AA  Discharge Concerns:  Safety,  stabilization, and risk of access to medication and medication stabilization   Estimated BEE:1-0OFHQ  Other:     I certify that inpatient services furnished can reasonably be expected to improve the patient's condition.    Derrill Center, NP 4/29/201712:19 PM

## 2016-03-20 NOTE — BHH Counselor (Signed)
Adult Comprehensive Assessment  Patient ID: Kyle Mejia, male   DOB: 21-Dec-1955, 60 y.o.   MRN: [id number]  Information Source: Information source: Patient  Current Stressors:  Educational / Learning stressors: Denies stressors Employment / Job issues: Denies stressors - knee gives him trouble doing job Family Relationships: Denies Engineer, mining / Lack of resources (include bankruptcy): Denies stressors Housing / Lack of housing: Denies stressors Physical health (include injuries & life threatening diseases): Has trouble with knee  Social relationships: Denies stressors Substance abuse: Drinks to deal with knee pain, stresses him that he is drinking Bereavement / Loss: Denies stressors  Living/Environment/Situation:  Living Arrangements: Alone Living conditions (as described by patient or guardian): Lives in a house trailer, safe neighborhood How long has patient lived in current situation?: 6-8 years What is atmosphere in current home: Comfortable  Family History:  Marital status: Long term relationship Divorced, when?: over 10 years Long term relationship, how long?: 18 years What types of issues is patient dealing with in the relationship?: No contact with ex-wife; no issues with girlfriend Are you sexually active?: Yes What is your sexual orientation?: Straight Has your sexual activity been affected by drugs, alcohol, medication, or emotional stress?: None Does patient have children?: No  Childhood History:  By whom was/is the patient raised?: Both parents Description of patient's relationship with caregiver when they were a child: Very good relationship with both growing up Patient's description of current relationship with people who raised him/her: Both are deceased How were you disciplined when you got in trouble as a child/adolescent?: "Normal" grounding, spanking Does patient have siblings?: Yes Number of Siblings: 2 Description of patient's current  relationship with siblings: Sisters - long distance - but good relationships, stay in touch Did patient suffer any verbal/emotional/physical/sexual abuse as a child?: No Did patient suffer from severe childhood neglect?: No Has patient ever been sexually abused/assaulted/raped as an adolescent or adult?: No Was the patient ever a victim of a crime or a disaster?: No Witnessed domestic violence?: No Has patient been effected by domestic violence as an adult?: No  Education:  Highest grade of school patient has completed: 12th grade Currently a student?: No Learning disability?: No  Employment/Work Situation:   Employment situation: Employed 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 patient been employed?: 30 years Patient's job has been impacted by current illness: Yes Describe how patient's job has been impacted: Sometimes his knee is too painful to be able to do his job What is the longest time patient has a held a job?: 30 years 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 Has patient ever been in the [employer]?: No Are There Guns or Other Weapons in [place]?: No  Financial Resources:   Financial resources: Income from employment Does patient have a representative payee or guardian?: No  Alcohol/Substance Abuse:   What has been your use of drugs/alcohol within the last 12 months?: Alcohol daily - amount varies - normally a quart of liquor or 18-pack of beer Alcohol/Substance Abuse Treatment Hx: Past Tx, Inpatient, Attends AA/NA If yes, describe treatment: Rehab at [facility]; AA in the past only Has alcohol/substance abuse ever caused legal problems?: Yes  Social Support System:   Patient's Community Support System: Good Describe Community Support System: Girlfriend, 2 sisters Type of faith/religion: None  Leisure/Recreation:   Leisure and Hobbies: Not much because of the pain - cannot be active  Strengths/Needs:   What things does  the patient do well?: Play  golf, draw, crafty In what areas does patient struggle / problems for patient: Pain, alcohol has gotten worse and hasn't been able to control it.  Feels alcohol is controlling him.  Discharge Plan:   Does patient have access to transportation?: Yes Will patient be returning to same living situation after discharge?: Yes Currently receiving community mental health services: No If no, would patient like referral for services when discharged?: Yes (What [place] Does patient have financial barriers related to discharge medications?: Yes Patient description of barriers related to discharge medications: No insurance, does not know if he could afford it.  Summary/Recommendations:   Summary and Recommendations (to be completed by the evaluator): Patient is a 60yo male admitted to the hospital with SI, a near-suicide attempt by hanging 2 days before admission, and alcohol abuse with daily drinking for last 20 years.  He reports primary trigger for admission was his knee pain that is debilitating and he also reports overwhelming sadness at times, staying in bed, sleeping all day, feeling despondent.  Patient will benefit from crisis stabilization, medication evaluation, group therapy and psychoeducation, in addition to case management for discharge planning. At discharge it is recommended that Patient adhere to the established discharge plan and continue in treatment.  Lysle Dingwall. 03/20/2016

## 2016-03-20 NOTE — Progress Notes (Signed)
D.  Pt pleasant on approach, no complaints voiced at this time.  Positive for evening wrap up group then returned to bed, minimal interaction on unit.  Pt did have a visitor this evening and was observed engaging in appropriate interaction.  Denies SI/HI/hallucinations at this time.  A.  Support and encouragement offered, medication given as ordered.  R.  Pt remains safe on the unit, will continue to monitor.

## 2016-03-20 NOTE — Progress Notes (Signed)
Patient did not attend AA wrap-up group he was sleeping.

## 2016-03-20 NOTE — Plan of Care (Signed)
Problem: Consults Goal: Depression Patient Education See Patient Education Module for education specifics.  Outcome: Progressing Nurse discussed depression/coping skills with patient.        

## 2016-03-20 NOTE — BHH Group Notes (Signed)
[facility] Group Notes: (Clinical Social Work)   03/20/2016      Type of Therapy:  Group Therapy   Participation Level:  Did Not Attend despite MHT prompting   Selmer Dominion, LCSW 03/20/2016, 11:14 AM

## 2016-03-21 NOTE — Progress Notes (Signed)
[facility] MD Progress Note  03/21/2016 11:17 AM Kyle Mejia  MRN:  [id number] Subjective:  Patient reports " I am feeling okay today, no complaints."  Objective: Kyle Mejia is awake, alert and oriented *3 , found resting in bedroom.  Denies suicidal or homicidal ideation. Denies auditory or visual hallucination and does not appear to be responding to internal stimuli. Patient reports interacts well with staff and others. Patient denies nausea, tremors, dizziness or headaches for alcohol withdrawal . Report attending group session, states this was the first group that he attended. States his  depression is "low." Reports good appetite and states that he is resting well.  Support, encouragement and reassurance was provided.   Principal Problem: Alcohol use disorder, severe, dependence ([place]) Diagnosis:   Patient Active Problem List   Diagnosis Date Noted  . Alcohol use disorder, severe, dependence ([place]) [F10.20] 03/19/2016   Total Time spent with patient: 20 minutes  Past Psychiatric History: See Above  Past Medical History: History reviewed. No pertinent past medical history.  Past Surgical History  Procedure Laterality Date  . Joint replacement     Family History: History reviewed. No pertinent family history. Family Psychiatric  History: See H&P Social History:  History  Alcohol Use  . Yes    Comment: daily     History  Drug Use Not on file    Social History   Social History  . Marital Status: Single    Spouse Name: N/A  . Number of Children: N/A  . Years of Education: N/A   Social History Main Topics  . Smoking status: Current Every Day Smoker -- 1.00 packs/day    Types: Cigarettes  . Smokeless tobacco: None  . Alcohol Use: Yes     Comment: daily  . Drug Use: None  . Sexual Activity: Not Asked   Other Topics Concern  . None   Social History Narrative   Additional Social History:                         Sleep: Fair  Appetite:   Fair  Current Medications: Current Facility-Administered Medications  Medication Dose Route Frequency Provider Last Rate Last Dose  . acetaminophen (TYLENOL) tablet 650 mg  650 mg Oral Q6H PRN Encarnacion Slates, NP   650 mg at 03/20/16 0814  . alum & mag hydroxide-simeth (MAALOX/MYLANTA) 200-200-20 MG/5ML suspension 30 mL  30 mL Oral Q4H PRN Encarnacion Slates, NP      . hydrOXYzine (ATARAX/VISTARIL) tablet 25 mg  25 mg Oral Q6H PRN Encarnacion Slates, NP   25 mg at 03/20/16 2108  . ibuprofen (ADVIL,MOTRIN) tablet 600 mg  600 mg Oral Q6H PRN Derrill Center, NP   600 mg at 03/21/16 0606  . loperamide (IMODIUM) capsule 2-4 mg  2-4 mg Oral PRN Encarnacion Slates, NP      . LORazepam (ATIVAN) tablet 1 mg  1 mg Oral Q6H PRN Encarnacion Slates, NP      . LORazepam (ATIVAN) tablet 1 mg  1 mg Oral TID Encarnacion Slates, NP   1 mg at 03/21/16 0819   Followed by  . [START ON 03/22/2016] LORazepam (ATIVAN) tablet 1 mg  1 mg Oral BID Encarnacion Slates, NP       Followed by  . [START ON 03/23/2016] LORazepam (ATIVAN) tablet 1 mg  1 mg Oral Daily Encarnacion Slates, NP      . magnesium hydroxide (MILK  OF MAGNESIA) suspension 30 mL  30 mL Oral Daily PRN Encarnacion Slates, NP      . multivitamin with minerals tablet 1 tablet  1 tablet Oral Daily Encarnacion Slates, NP   1 tablet at 03/21/16 2620  . nicotine polacrilex (NICORETTE) gum 2 mg  2 mg Oral PRN Nicholaus Bloom, MD   2 mg at 03/20/16 2107  . ondansetron (ZOFRAN-ODT) disintegrating tablet 4 mg  4 mg Oral Q6H PRN Encarnacion Slates, NP      . thiamine (VITAMIN B-1) tablet 100 mg  100 mg Oral Daily Encarnacion Slates, NP   100 mg at 03/21/16 0819  . traZODone (DESYREL) tablet 50 mg  50 mg Oral QHS Encarnacion Slates, NP   50 mg at 03/20/16 2107    Lab Results:  Results for orders placed or performed during the hospital encounter of 03/19/16 (from the past 48 hour(s))  Comprehensive metabolic panel     Status: Abnormal   Collection Time: 03/19/16 11:27 AM  Result Value Ref Range   Sodium 139 135 - 145 mmol/L    Potassium 3.6 3.5 - 5.1 mmol/L   Chloride 109 101 - 111 mmol/L   CO2 20 (L) 22 - 32 mmol/L   Glucose, Bld 126 (H) 65 - 99 mg/dL   BUN 9 6 - 20 mg/dL   Creatinine, Ser 0.63 0.61 - 1.24 mg/dL   Calcium 8.5 (L) 8.9 - 10.3 mg/dL   Total Protein 6.3 (L) 6.5 - 8.1 g/dL   Albumin 2.6 (L) 3.5 - 5.0 g/dL   AST 94 (H) 15 - 41 U/L   ALT 66 (H) 17 - 63 U/L   Alkaline Phosphatase 126 38 - 126 U/L   Total Bilirubin 1.0 0.3 - 1.2 mg/dL   GFR calc non Af Amer >60 >60 mL/min   GFR calc Af Amer >60 >60 mL/min    Comment: (NOTE) The eGFR has been calculated using the CKD EPI equation. This calculation has not been validated in all clinical situations. eGFR's persistently <60 mL/min signify possible Chronic Kidney Disease.    Anion gap 10 5 - 15  CBC     Status: Abnormal   Collection Time: 03/19/16 11:27 AM  Result Value Ref Range   WBC 7.2 4.0 - 10.5 K/uL   RBC 4.18 (L) 4.22 - 5.81 MIL/uL   Hemoglobin 14.5 13.0 - 17.0 g/dL   HCT 43.1 39.0 - 52.0 %   MCV 103.1 (H) 78.0 - 100.0 fL   MCH 34.7 (H) 26.0 - 34.0 pg   MCHC 33.6 30.0 - 36.0 g/dL   RDW 13.2 11.5 - 15.5 %   Platelets 105 (L) 150 - 400 K/uL    Comment: PLATELET COUNT CONFIRMED BY SMEAR REPEATED TO VERIFY   Ethanol (ETOH)     Status: Abnormal   Collection Time: 03/19/16 11:28 AM  Result Value Ref Range   Alcohol, Ethyl (B) 273 (H) <5 mg/dL    Comment:        LOWEST DETECTABLE LIMIT FOR SERUM ALCOHOL IS 5 mg/dL FOR MEDICAL PURPOSES ONLY   Urine rapid drug screen (hosp performed) (Not at [facility])     Status: None   Collection Time: 03/19/16 11:30 AM  Result Value Ref Range   Opiates NONE DETECTED NONE DETECTED   Cocaine NONE DETECTED NONE DETECTED   Benzodiazepines NONE DETECTED NONE DETECTED   Amphetamines NONE DETECTED NONE DETECTED   Tetrahydrocannabinol NONE DETECTED NONE DETECTED   Barbiturates NONE DETECTED  NONE DETECTED    Comment:        DRUG SCREEN FOR MEDICAL PURPOSES ONLY.  IF CONFIRMATION IS NEEDED FOR ANY PURPOSE,  NOTIFY LAB WITHIN 5 DAYS.        LOWEST DETECTABLE LIMITS FOR URINE DRUG SCREEN Drug Class       Cutoff (ng/mL) Amphetamine      1000 Barbiturate      200 Benzodiazepine   476 Tricyclics       546 Opiates          300 Cocaine          300 THC              50     Blood Alcohol level:  Lab Results  Component Value Date   [facility] 273* 03/19/2016    Physical Findings: AIMS: Facial and Oral Movements Muscles of Facial Expression: None, normal Lips and Perioral Area: None, normal Jaw: None, normal Tongue: None, normal,Extremity Movements Upper (arms, wrists, hands, fingers): None, normal Lower (legs, knees, ankles, toes): None, normal, Trunk Movements Neck, shoulders, hips: None, normal, Overall Severity Severity of abnormal movements (highest score from questions above): None, normal Incapacitation due to abnormal movements: None, normal Patient's awareness of abnormal movements (rate only patient's report): No Awareness, Dental Status Current problems with teeth and/or dentures?: Yes Does patient usually wear dentures?: No  CIWA:  CIWA-Ar Total: 1 COWS:  COWS Total Score: 1  Musculoskeletal: Strength & Muscle Tone: within normal limits Gait & Station: normal Patient leans: N/A  Psychiatric Specialty Exam: Review of Systems  Psychiatric/Behavioral: Positive for depression. Negative for suicidal ideas and hallucinations. The patient is nervous/anxious.   All other systems reviewed and are negative.   Blood pressure 135/83, pulse 74, temperature 97.6 F (36.4 C), temperature source Oral, resp. rate 16.There is no weight on file to calculate BMI.  General Appearance: Casual and Guarded  Eye Contact::  Fair  Speech:  Clear and Coherent  Volume:  Normal  Mood:  Depressed  Affect:  Depressed and Flat  Thought Process:  Linear and Logical  Orientation:  Full (Time, Place, and Person)  Thought Content:  Hallucinations: None  Suicidal Thoughts:  No  Homicidal Thoughts:  No   Memory:  Immediate;   Fair Recent;   Fair Remote;   Fair  Judgement:  Fair  Insight:  Fair  Psychomotor Activity:  Restlessness  Concentration:  Fair  Recall:  [employer] of Knowledge:Fair  Language: Fair  Akathisia:  No  Handed:  Right  AIMS (if indicated):     Assets:  Desire for Improvement Resilience  ADL's:  Intact  Cognition: WNL  Sleep:  Number of Hours: 6.25    I agree with current treatment plan on 03/21/2016, Patient seen face-to-face for psychiatric evaluation follow-up, chart reviewed. Reviewed the information documented and agree with the treatment plan.  Treatment Plan Summary:  Daily contact with patient to assess and evaluate symptoms and progress in treatment and Medication management  Continue with Trazodone 50 mg for insomnia Started on CWIA/ Ativan Protocol Will continue to monitor vitals ,medication compliance and treatment side effects while patient is here.  Reviewed labs Glucose 126 elevated ,BAL - 273, UDS - negative  CSW will start working on disposition.  Patient to participate in therapeutic milieu  Derrill Center, NP 03/21/2016, 11:17 AM

## 2016-03-21 NOTE — Plan of Care (Signed)
Problem: Consults Goal: Suicide Risk Patient Education (See Patient Education module for education specifics)  Outcome: Progressing Nurse discussed depression/suicide thoughts/coping skills with patient.

## 2016-03-21 NOTE — BHH Group Notes (Signed)
The focus of this group is to educate the patient on the purpose and policies of crisis stabilization and provide a format to answer questions about their admission.  The group details unit policies and expectations of patients while admitted.  Patient did not attend 0900 nurse education orientation group this morning.  Patient stayed in bed.   

## 2016-03-21 NOTE — Progress Notes (Signed)
D:  Patient's self inventory sheet, patient sleeps good, sleep medication is helpful.  Good appetite, low energy level, good concentration.  Rated depression, hopeless and anxiety #5.  Withdrawals, tremors, sedation, cravings, runny nose.  Denied SI.  Left knee pain, worst pain in past 24 hours is #7, pain medication is not helpful.  Goal is to "get motivated.  Try to stay wake."  No discharge plans.  Needs financial assistance to purchase medications after discharge. A:  Medications administered per MD orders.  Emotional support and encouragements given patient. R:  Patient denied SI and HI, contracts for safety.  Denied A/V hallucinations.  Safety maintained with 15 minute checks.

## 2016-03-21 NOTE — BHH Group Notes (Signed)
[place] Group Notes:  (Clinical Social Work)  03/21/2016  10:00-11:00AM  Summary of Progress/Problems:   The main focus of today's process group was to   1)  discuss the importance of adding supports  2)  define health supports versus unhealthy supports  3)  identify the patient's current unhealthy supports and plan how to handle them  4)  Identify the patient's current healthy supports and plan what to add.  An emphasis was placed on using counselor, doctor, therapy groups, 12-step groups, and problem-specific support groups to expand supports.    The patient expressed full comprehension of the concepts presented, and agreed that there is a need to add more supports.  The patient listened for most of group, but at the end contributed his thoughts to some of the other patients about this illness being a lifelong one.  Type of Therapy:  Process Group with Motivational Interviewing  Participation Level:  Active  Participation Quality:  Attentive and Sharing  Affect:  Blunted  Cognitive:  Oriented  Insight:  Engaged  Engagement in Therapy:  Engaged  Modes of Intervention:   Education, Psychiatric nurse, Activity  Selmer Dominion, LCSW 03/21/2016

## 2016-03-21 NOTE — Progress Notes (Signed)
Pt did not attend AA meeting this evening.

## 2016-03-22 MED ORDER — GABAPENTIN 100 MG PO CAPS
100.0000 mg | ORAL_CAPSULE | Freq: Three times a day (TID) | ORAL | Status: DC
Start: 2016-03-22 — End: 2016-03-23
  Administered 2016-03-22 – 2016-03-23 (×3): 100 mg via ORAL
  Filled 2016-03-22 (×3): qty 1
  Filled 2016-03-22: qty 42
  Filled 2016-03-22: qty 1
  Filled 2016-03-22 (×2): qty 42
  Filled 2016-03-22: qty 1

## 2016-03-22 MED ORDER — DULOXETINE HCL 30 MG PO CPEP
30.0000 mg | ORAL_CAPSULE | Freq: Every day | ORAL | Status: DC
  Administered 2016-03-22 – 2016-03-23 (×2): 30 mg via ORAL
  Filled 2016-03-22: qty 14
  Filled 2016-03-22 (×4): qty 1

## 2016-03-22 NOTE — Tx Team (Signed)
Interdisciplinary Treatment Plan Update (Adult)  Date:  03/22/2016  Time Reviewed:  12:07 PM   Progress in Treatment: Attending groups: No. Participating in groups:  No. Taking medication as prescribed:  Yes. Tolerating medication:  Yes. Family/Significant othe contact made:   Patient understands diagnosis:  Yes. and As evidenced by:  seeking treatment for depression, SI with a plan, alcohol abuse, and for medication stabilization Discussing patient identified problems/goals with staff:  Yes. Medical problems stabilized or resolved:  Yes. Denies suicidal/homicidal ideation: Yes. Issues/concerns per patient self-inventory:  Other:  Discharge Plan or Barriers: Pt plans to go to [facility] or back home at discharge. Pt open to Tamela Gammon for outpatient services.  Reason for Continuation of Hospitalization: Depression Medication stabilization Withdrawal symptoms  Comments:  Kyle Mejia is an 60 y.o. male who voluntarily presents to [facility] with c/o SI and Alcohol abuse. Pt indicates that he has been drinking daily for over 20 years. Pt was tearful, as he conveyed his feelings of not being worth it and his recent thoughts of suicide. Pt shared that he had a plan to hang himself 2 days ago, but didn't go thru with it b/c he thought of all of the people he would be hurting. Pt denies having HI or AVH, but endorses having current SI. Pt presented as clear and cogent in his speech, although his BAL was at 273. Diagnosis: MDD, single episode, severe; Alcohol Use Disorder, severe  Estimated length of stay:  3-5 days   New goal(s): to develop effective aftercare plan.  Additional Comments:  Patient and CSW reviewed pt's identified goals and treatment plan. Patient verbalized understanding and agreed to treatment plan. CSW reviewed [facility] "Discharge Process and Patient Involvement" Form. Pt verbalized understanding of information provided and signed form.    Review of initial/current patient  goals per problem list:  1. Goal(s): Patient will participate in aftercare plan  Met: No.   Target date: at discharge  As evidenced by: Patient will participate within aftercare plan AEB aftercare provider and housing plan at discharge being identified.  5/1: Pt being referred to [facility].   2. Goal (s): Patient will exhibit decreased depressive symptoms and suicidal ideations.  Met: No.    Target date: at discharge  As evidenced by: Patient will utilize self rating of depression at 3 or below and demonstrate decreased signs of depression or be deemed stable for discharge by MD.  5/1: Pt reports high depression. No SI/HI/AVH.  3. Goal(s): Patient will demonstrate decreased signs of withdrawal due to substance abuse  Met:No.  Target date:at discharge   As evidenced by: Patient will produce a CIWA/COWS score of 0, have stable vitals signs, and no symptoms of withdrawal.  5/1: Pt reports mild withdrawals with CIWA score of 2 and high BP.  Attendees: Patient:   03/22/2016 12:07 PM   Family:   03/22/2016 12:07 PM   Physician:  Dr. Carlton Adam, MD 03/22/2016 12:07 PM   Nursing:   Everlean Cherry RN 03/22/2016 12:07 PM   Clinical Social Worker: Maxie Better, LCSW 03/22/2016 12:07 PM   Clinical Social Worker: Erasmo Downer Drinkard LCSW 03/22/2016 12:07 PM   Other:  Gerline Legacy Nurse Case Manager 03/22/2016 12:07 PM   Other:  Agustina Caroli NP 03/22/2016 12:07 PM   Other:   03/22/2016 12:07 PM   Other:  03/22/2016 12:07 PM   Other:  03/22/2016 12:07 PM   Other:  03/22/2016 12:07 PM    03/22/2016 12:07 PM    03/22/2016 12:07  PM    03/22/2016 12:07 PM    03/22/2016 12:07 PM    Scribe for Treatment Team:   Maxie Better, LCSW 03/22/2016 12:07 PM

## 2016-03-22 NOTE — Progress Notes (Signed)
D:  Patient's self inventory sheet, patient has fair to good sleep, sleep medication was helpful.  Good appetite, low energy level, good concentration.  Rated depression and hopeless 4, anxiety 5.  Withdrawals, cravings, sedation, agitation.  Denied SI.  Physical problems, pain, worst pain #7, knees.  No pain medication.  Goal is to move around more.  Plans to go outside.  No discharge plans.  Needs financial assistance with medications. A:  Medications administered per MD order.  Emotional support and assistance given patient. R:  Denied SI and HI, contracts for safety.  Denied A/V hallucinations.  Safety maintained with 15 minute checks.

## 2016-03-22 NOTE — Progress Notes (Signed)
[facility] MD Progress Note  03/22/2016 2:59 PM Kyle Mejia  MRN:  [id number] Subjective:  Kamyar states that he is having a hard time with his alcohol intake. He states he has been using alcohol to help with his depression and his knee pain. He has never tried antidepressants before. His depression is mostly anergic. He works as much as his pain lets him work. States he wants to get better. He has a GF and her kids to support her Principal Problem: Alcohol use disorder, severe, dependence ([place]) Diagnosis:   Patient Active Problem List   Diagnosis Date Noted  . Alcohol use disorder, severe, dependence ([place]) [F10.20] 03/19/2016   Total Time spent with patient: 20 minutes  Past Psychiatric History: see admission H and P  Past Medical History: History reviewed. No pertinent past medical history.  Past Surgical History  Procedure Laterality Date  . Joint replacement     Family History: History reviewed. No pertinent family history. Family Psychiatric  History: see admission H and P  Social History:  History  Alcohol Use  . Yes    Comment: daily     History  Drug Use Not on file    Social History   Social History  . Marital Status: Single    Spouse Name: N/A  . Number of Children: N/A  . Years of Education: N/A   Social History Main Topics  . Smoking status: Current Every Day Smoker -- 1.00 packs/day    Types: Cigarettes  . Smokeless tobacco: None  . Alcohol Use: Yes     Comment: daily  . Drug Use: None  . Sexual Activity: Not Asked   Other Topics Concern  . None   Social History Narrative   Additional Social History:                         Sleep: Poor  Appetite:  Fair  Current Medications: Current Facility-Administered Medications  Medication Dose Route Frequency Provider Last Rate Last Dose  . acetaminophen (TYLENOL) tablet 650 mg  650 mg Oral Q6H PRN Encarnacion Slates, NP   650 mg at 03/20/16 0814  . alum & mag hydroxide-simeth (MAALOX/MYLANTA)  200-200-20 MG/5ML suspension 30 mL  30 mL Oral Q4H PRN Encarnacion Slates, NP      . DULoxetine (CYMBALTA) DR capsule 30 mg  30 mg Oral Daily Nicholaus Bloom, MD      . gabapentin (NEURONTIN) capsule 100 mg  100 mg Oral TID Nicholaus Bloom, MD      . hydrOXYzine (ATARAX/VISTARIL) tablet 25 mg  25 mg Oral Q6H PRN Encarnacion Slates, NP   25 mg at 03/20/16 2108  . ibuprofen (ADVIL,MOTRIN) tablet 600 mg  600 mg Oral Q6H PRN Derrill Center, NP   600 mg at 03/22/16 0733  . loperamide (IMODIUM) capsule 2-4 mg  2-4 mg Oral PRN Encarnacion Slates, NP      . LORazepam (ATIVAN) tablet 1 mg  1 mg Oral Q6H PRN Encarnacion Slates, NP      . LORazepam (ATIVAN) tablet 1 mg  1 mg Oral BID Encarnacion Slates, NP   1 mg at 03/22/16 0731   Followed by  . [START ON 03/23/2016] LORazepam (ATIVAN) tablet 1 mg  1 mg Oral Daily Encarnacion Slates, NP      . magnesium hydroxide (MILK OF MAGNESIA) suspension 30 mL  30 mL Oral Daily PRN Encarnacion Slates, NP      .  multivitamin with minerals tablet 1 tablet  1 tablet Oral Daily Encarnacion Slates, NP   1 tablet at 03/22/16 0730  . nicotine polacrilex (NICORETTE) gum 2 mg  2 mg Oral PRN Nicholaus Bloom, MD   2 mg at 03/20/16 2107  . ondansetron (ZOFRAN-ODT) disintegrating tablet 4 mg  4 mg Oral Q6H PRN Encarnacion Slates, NP      . thiamine (VITAMIN B-1) tablet 100 mg  100 mg Oral Daily Encarnacion Slates, NP   100 mg at 03/22/16 0730  . traZODone (DESYREL) tablet 50 mg  50 mg Oral QHS Encarnacion Slates, NP   50 mg at 03/21/16 2137    Lab Results: No results found for this or any previous visit (from the past 48 hour(s)).  Blood Alcohol level:  Lab Results  Component Value Date   [facility] 273* 03/19/2016    Physical Findings: AIMS: Facial and Oral Movements Muscles of Facial Expression: None, normal Lips and Perioral Area: None, normal Jaw: None, normal Tongue: None, normal,Extremity Movements Upper (arms, wrists, hands, fingers): None, normal Lower (legs, knees, ankles, toes): None, normal, Trunk Movements Neck, shoulders,  hips: None, normal, Overall Severity Severity of abnormal movements (highest score from questions above): None, normal Incapacitation due to abnormal movements: None, normal Patient's awareness of abnormal movements (rate only patient's report): No Awareness, Dental Status Current problems with teeth and/or dentures?: Yes Does patient usually wear dentures?: No  CIWA:  CIWA-Ar Total: 1 COWS:  COWS Total Score: 1  Musculoskeletal: Strength & Muscle Tone: within normal limits Gait & Station: normal Patient leans: normal  Psychiatric Specialty Exam: Review of Systems  Constitutional: Positive for malaise/fatigue.  HENT: Negative.   Eyes: Negative.   Respiratory: Negative.   Cardiovascular: Negative.   Gastrointestinal: Negative.   Genitourinary: Negative.   Musculoskeletal: Negative.   Skin: Negative.   Neurological: Positive for weakness.  Endo/Heme/Allergies: Negative.   Psychiatric/Behavioral: Positive for depression and substance abuse. The patient is nervous/anxious.     Blood pressure 137/79, pulse 83, temperature 99.3 F (37.4 C), temperature source Oral, resp. rate 16, height 5\' 10"  (1.778 m), weight 79.379 kg (175 lb).Body mass index is 25.11 kg/(m^2).  General Appearance: Fairly Groomed  Engineer, water::  Fair  Speech:  Clear and Coherent  Volume:  Decreased  Mood:  Anxious and Depressed  Affect:  Restricted  Thought Process:  Coherent and Goal Directed  Orientation:  Full (Time, Place, and Person)  Thought Content:  symptoms events worries concerns  Suicidal Thoughts:  No  Homicidal Thoughts:  No  Memory:  Immediate;   Fair Recent;   Fair Remote;   Fair  Judgement:  Fair  Insight:  Present  Psychomotor Activity:  Decreased  Concentration:  Fair  Recall:  [employer] of Knowledge:Fair  Language: Fair  Akathisia:  No  Handed:  Right  AIMS (if indicated):     Assets:  Desire for Improvement  ADL's:  Intact  Cognition: WNL  Sleep:  Number of Hours: 6.5    Treatment Plan Summary: Daily contact with patient to assess and evaluate symptoms and progress in treatment and Medication management Supportive approach/coping skills Alcohol dependence; ativan detox protocol/work a relapse prevention plan Depression; will start Cymbalta 30 mg daily Pain; will start Neurontin 100 mg TID and reassess Will explore residential treatment options Work with CBT/mindfulness Sherill Mangen A, MD 03/22/2016, 2:59 PM

## 2016-03-22 NOTE — Progress Notes (Signed)
D-pt slept in his bed majority of the night, pt c/o being extremely tired A-pt took his pm medications R-cont to monitor for safety

## 2016-03-22 NOTE — Progress Notes (Signed)
Pt did not attend AA meeting this evening.

## 2016-03-22 NOTE — BHH Group Notes (Signed)
[facility] LCSW Group Therapy Topic:  Overcoming Obstacles  03/22/2016 3:26 PM  Type of Therapy:  Group Therapy  Participation Level:  Did Not Attend   Beverely Pace 03/22/2016, 3:26 PM

## 2016-03-22 NOTE — Progress Notes (Signed)
D:Patient in his room most of the night.  Patient states she had a good day.  Patient states his pain is under control.  Patient states his withdrawal symptoms are minimal.  Patient states he cannot remember if he had a goal today  Patient denies SI/HI and denies AVH. A: Staff to monitor Q 15 mins for safety.  Encouragement and support offered.  Scheduled medications administered per orders. R: Patient remains safe on the unit.  Patient did not attend group tonight.  Patient visible on the unit for snack and medications.  Patient taking administered medications.

## 2016-03-22 NOTE — Plan of Care (Signed)
Problem: Diagnosis: Increased Risk For Suicide Attempt Goal: LTG-Patient Will Show Positive Response to Medication LTG (by discharge) : Patient will show positive response to medication and will participate in the development of the discharge plan.  Outcome: Progressing Nurse discussed suicidal thoughts/depression/coping skills with patient.

## 2016-03-22 NOTE — BHH Suicide Risk Assessment (Signed)
[place] INPATIENT:  Family/Significant Other Suicide Prevention Education  Suicide Prevention Education:  Education Completed; Kyle Mejia, girlfriend, [phone],  (name of family member/significant other) has been identified by the patient as the family member/significant other with whom the patient will be residing, and identified as the person(s) who will aid the patient in the event of a mental health crisis (suicidal ideations/suicide attempt).  With written consent from the patient, the family member/significant other has been provided the following suicide prevention education, prior to the and/or following the discharge of the patient.  The suicide prevention education provided includes the following:  Suicide risk factors  Suicide prevention and interventions  National Suicide Hotline telephone number  [facility] assessment telephone number  [facility] Emergency Assistance [place] and/or Residential Mobile Crisis Unit telephone number  Request made of family/significant other to:  Remove weapons (e.g., guns, rifles, knives), all items previously/currently identified as safety concern.    Remove drugs/medications (over-the-counter, prescriptions, illicit drugs), all items previously/currently identified as a safety concern.  The family member/significant other verbalizes understanding of the suicide prevention education information provided.  The family member/significant other agrees to remove the items of safety concern listed above.  "I am absolutely amazed that he is still here and this is day 4, he has never reached out for help before, ever.  States patient feels "he is so undeserving of help", wanted to know if patient has mentioned issues w leg and pain associated w it.  States he is "in so much pain he drinks it away, is very depressed."  Per girlfriend, "he really really sounds like he wants it this time, has a lot of support on the outside."   States patient has poor living conditions at home, concerned about patient's potential for relapse and returning to home.  States that patient rapidly becomes suicidal in his home environment.  Deals w untreated chronic pain, patient has not accessed medical care. Girlfriend notes significant mood swings and rage.  Feels current hospitalization has shown improvement in patient's sleep, behavior, mood, willingness to access care.  Feels patient is "finallly willing to get help." Reviewed SPE w girlfriend, question/concerns addressed.  Pt does not have access to weapons or medications, states he will go and "hang himself in the [place]."    Beverely Pace 03/22/2016, 3:46 PM

## 2016-03-22 NOTE — Progress Notes (Signed)
Pt attended spiritual care group on grief and loss facilitated by chaplain Donnisha Besecker   Group opened with brief discussion and psycho-social ed around grief and loss in relationships and in relation to self - identifying life patterns, circumstances, changes that cause losses. Established group norm of speaking from own life experience. Group goal of establishing open and affirming space for members to share loss and experience with grief, normalize grief experience and provide psycho social education and grief support.     

## 2016-03-22 NOTE — Progress Notes (Signed)
D: Patient resting in bed with eyes closed.  Respirations even and unlabored.  Patient appears to be in no apparent distress. A: Staff to monitor Q 15 mins for safety.   R:Patient remains safe on the unit.  

## 2016-03-23 MED ORDER — PNEUMOCOCCAL VAC POLYVALENT 25 MCG/0.5ML IJ INJ: 0.5000 mL | INJECTION | INTRAMUSCULAR | Status: DC

## 2016-03-23 MED ORDER — TRAZODONE HCL 50 MG PO TABS: 50.0000 mg | ORAL_TABLET | Freq: Every day | ORAL | Status: DC

## 2016-03-23 MED ORDER — GABAPENTIN 100 MG PO CAPS: 100.0000 mg | ORAL_CAPSULE | Freq: Three times a day (TID) | ORAL | Status: DC

## 2016-03-23 MED ORDER — NICOTINE 21 MG/24HR TD PT24
21.0000 mg | MEDICATED_PATCH | Freq: Every day | TRANSDERMAL | Status: DC
  Administered 2016-03-23: 21 mg via TRANSDERMAL
  Filled 2016-03-23 (×2): qty 1

## 2016-03-23 MED ORDER — NICOTINE 21 MG/24HR TD PT24
21.0000 mg | MEDICATED_PATCH | Freq: Every day | TRANSDERMAL | Status: DC
Start: 2016-03-23 — End: 2016-03-23
  Filled 2016-03-23 (×3): qty 14

## 2016-03-23 MED ORDER — DULOXETINE HCL 30 MG PO CPEP: 30.0000 mg | ORAL_CAPSULE | Freq: Every day | ORAL | Status: DC

## 2016-03-23 MED ORDER — NICOTINE 21 MG/24HR TD PT24: 21.0000 mg | MEDICATED_PATCH | Freq: Every day | TRANSDERMAL | Status: DC

## 2016-03-23 NOTE — Progress Notes (Signed)
  [facility] Adult Case Management Discharge Plan :  Will you be returning to the same living situation after discharge:  No.Pt accepted to [facility] for today.  At discharge, do you have transportation home?: Yes,  [facility] will pick up pt at 12:30PM  Do you have the ability to pay for your medications: Yes,  mental health  Release of information consent forms completed and submitted to medical records by CSW.  Patient to Follow up at: Follow-up Information    Follow up with [facility] On 03/23/2016.   Why:  You have been accepted to [facility] on this date. Please make sure you have 14 day medication supply. [facility] transport will pick you up at 12:30PM today.    Contact information:   [place], [place] [address] Phone: [phone] Fax: [phone]      Next level of care provider has access to [place] and Suicide Prevention discussed: Yes,  SPE completed with pt's girlfriend. SPI pamphlet and Mobile Crisis information provided to pt and he was encouraged to share information with support network, ask questions, and talk about any concerns relating to SPE.  Have you used any form of tobacco in the last 30 days? (Cigarettes, Smokeless Tobacco, Cigars, and/or Pipes): Yes  Has patient been referred to the Quitline?: Patient refused referral  Patient has been referred for addiction treatment: Yes  Smart, Peregrine Nolt LCSW 03/23/2016, 11:58 AM

## 2016-03-23 NOTE — Progress Notes (Signed)
Discharge note:  Patient discharged home per MD order.  Patient received all personal belongings from unit and locker. Reviewed AVS/discharge instructions with patient.  Reviewed all follow up appointments.  Patient received 14-day supply of medications.  He received prescriptions.  He denies SI/HI/AVH.  Patient left ambulatory with representative from [facility].

## 2016-03-23 NOTE — BHH Group Notes (Signed)
[place] Group Notes:  (Nursing/MHT/Case Management/Adjunct)  Date:  03/23/2016  Time:  0900 am  Type of Therapy:  Psychoeducational Skills  Participation Level:  Minimal  Participation Quality:  Appropriate  Affect:  Appropriate  Cognitive:  Alert and Appropriate  Insight:  Lacking  Engagement in Group:  Resistant  Modes of Intervention:  Support  Summary of Progress/Problems: Patient didn't have anything to share during group.  He was actively listening however.  Zipporah Plants 03/23/2016, 11:39 AM

## 2016-03-23 NOTE — BHH Suicide Risk Assessment (Signed)
[facility] Discharge Suicide Risk Assessment   Principal Problem: Alcohol use disorder, severe, dependence ([place]) Discharge Diagnoses:  Patient Active Problem List   Diagnosis Date Noted  . Alcohol use disorder, severe, dependence ([place]) [F10.20] 03/19/2016    Total Time spent with patient: 20 minutes  Musculoskeletal: Strength & Muscle Tone: within normal limits Gait & Station: normal Patient leans: normal  Psychiatric Specialty Exam: Review of Systems  Constitutional: Negative.   HENT: Negative.   Eyes: Negative.   Cardiovascular: Negative.   Gastrointestinal: Negative.   Genitourinary: Negative.   Musculoskeletal: Positive for joint pain.  Skin: Negative.   Neurological: Negative.   Endo/Heme/Allergies: Negative.   Psychiatric/Behavioral: Positive for depression and substance abuse.    Blood pressure 119/74, pulse 102, temperature 97.4 F (36.3 C), temperature source Oral, resp. rate 18, height 5\' 10"  (1.778 m), weight 79.379 kg (175 lb).Body mass index is 25.11 kg/(m^2).  General Appearance: Fairly Groomed  Engineer, water::  Fair  Speech:  Clear and [id number]  Volume:  Normal  Mood:  Euthymic  Affect:  Appropriate  Thought Process:  Coherent and Goal Directed  Orientation:  Full (Time, Place, and Person)  Thought Content:  plans as he moves on, relapse prevention plan  Suicidal Thoughts:  No  Homicidal Thoughts:  No  Memory:  Immediate;   Fair Recent;   Fair Remote;   Fair  Judgement:  Fair  Insight:  Present  Psychomotor Activity:  Normal  Concentration:  Fair  Recall:  [employer] of Knowledge:Fair  Language: Fair  Akathisia:  No  Handed:  Right  AIMS (if indicated):     Assets:  Desire for Improvement Housing Social Support  Sleep:  Number of Hours: 6.75  Cognition: WNL  ADL's:  Intact  In full contact with reality. There are no active S/S of withdrawal. There are no active SI plans or intent. Willing and motivated to pursue outpatient treatment  Mental  Status Per Nursing Assessment::   On Admission:  NA  Demographic Factors:  Male and Caucasian  Loss Factors: Decline in physical health  Historical Factors: none identified  Risk Reduction Factors:   Sense of responsibility to family, Living with another person, especially a relative and Positive social support  Continued Clinical Symptoms:  Depression:   Comorbid alcohol abuse/dependence Alcohol/Substance Abuse/Dependencies  Cognitive Features That Contribute To Risk:  None    Suicide Risk:  Minimal: No identifiable suicidal ideation.  Patients presenting with no risk factors but with morbid ruminations; may be classified as minimal risk based on the severity of the depressive symptoms  Follow-up Information    Follow up with [facility] On 03/23/2016.   Why:  You have been accepted to [facility] on this date. Please make sure you have 14 day medication supply. [facility] transport will pick you up at 12:30PM today.    Contact information:   [place], [place] [address] Phone: [phone] Fax: [phone]      Plan Of Care/Follow-up recommendations:  Activity:  as tolerated Diet:  regular Follow up as above Marisah Laker A, MD 03/23/2016, 12:16 PM

## 2016-03-23 NOTE — Discharge Summary (Signed)
Physician Discharge Summary Note  Patient:  Kyle Mejia is an 60 y.o., male MRN:  [id number] DOB:  28-Mar-1956 Patient phone:  [phone] (home)  Patient address:   [address] [place] [address],  Total Time spent with patient: Greater than 30 minutes  Date of Admission:  03/19/2016 Date of Discharge: 03-23-16  Reason for Admission: Alcohol detox  Principal Problem: Alcohol use disorder, severe, dependence [facility])  Discharge Diagnoses: Patient Active Problem List   Diagnosis Date Noted  . Alcohol use disorder, severe, dependence ([place]) [F10.20] 03/19/2016   Past Psychiatric History: Alcoholism, chronic  Past Medical History: History reviewed. No pertinent past medical history.  Past Surgical History  Procedure Laterality Date  . Joint replacement     Family History: History reviewed. No pertinent family history. Family Psychiatric  History: See H&P  Social History:  History  Alcohol Use  . Yes    Comment: daily     History  Drug Use Not on file    Social History   Social History  . Marital Status: Single    Spouse Name: N/A  . Number of Children: N/A  . Years of Education: N/A   Social History Main Topics  . Smoking status: Current Every Day Smoker -- 1.00 packs/day    Types: Cigarettes  . Smokeless tobacco: None  . Alcohol Use: Yes     Comment: daily  . Drug Use: None  . Sexual Activity: Not Asked   Other Topics Concern  . None   Social History Narrative   Hospital Course: MONTERIUS U[facility] is an 60 y.o. male who voluntarily presents to [facility] with c/o SI and Alcohol abuse. Pt indicates that he has been drinking daily for over 20 years. Pt was tearful, as he conveyed his feelings of not being worth it and his recent thoughts of suicide. Pt shared that he had a plan to hang himself 2 days ago, but didn't go thru with it b/c he thought of all of the people he would be hurting. Pt denies having HI or AVH, but endorses having current SI. Pt  presented as clear and cogent in his speech, although his BAL was at 273.   Capri was admitted to the hospital with a BAL of 273 per toxicology tests results. He admits having been drinking a lot & it has worsened. He was also presenting with worsening symptoms of depression & suicidal ideations with plans to hang himself. He was here for alcohol detox & mood stabilization treatments. Charle's recent lab reports indicated elevated liver enzymes (AST & ALT), possibly from chronic alcoholism. As a result, not a candidate for Librium detoxification treatment protocols. This is because Librium is a long acting Benzodiazepine with a long half life, not suitable for a compromised liver enzymes. His detoxification treatment was achieved using Ativan detox regimen on a tapering dose format. By using Ativan detox regimen, Revan received a cleaner detoxification treatment without the lingering effects of the Librium capsules in his system. He was enrolled in the group counseling sessions, AA/NA meetings being offered and held on this unit. He participated and learned coping skills. He tolerated his treatment regimen without any significant adverse effects and or reactions reported.  Besides the detoxification treatments, Ajeet was also medicated & discharged on; Trazodone 50 mg for insomnia, Gabapentin 100 mg for agitation & Duloxetine 30 mg for depression. Kimsey has completed detox treatment and his mood is stable. This is evidenced by his reports of improved mood and absence  of substance withdrawal symptoms. He is currently being discharged to the [facility] in [place], [place] for further substance abuse treatments & for psychiatric care/routine medication management, he will be receiving this service at the [facility] here in [place], [place]. Charlea was provided with all the necessary information needed to make this appointment without problems. He was encouraged to join/attend AA/NA meetings  being offered and held within his community to achieve & maintain maximum sobriety.   Upon discharge, Rodolfo adamantly denies any suicidal, homicidal ideations, auditory, visual hallucinations, delusional thoughts, paranoia & or withdrawal symptoms. He left [facility] with all personal belongings in no apparent distress. He received a 14 days worth supply samples of his [facility] discharge medications provided by [facility] pharmacy. Transportation per [employer].  Physical Findings: AIMS: Facial and Oral Movements Muscles of Facial Expression: None, normal Lips and Perioral Area: None, normal Jaw: None, normal Tongue: None, normal,Extremity Movements Upper (arms, wrists, hands, fingers): None, normal Lower (legs, knees, ankles, toes): None, normal, Trunk Movements Neck, shoulders, hips: None, normal, Overall Severity Severity of abnormal movements (highest score from questions above): None, normal Incapacitation due to abnormal movements: None, normal Patient's awareness of abnormal movements (rate only patient's report): No Awareness, Dental Status Current problems with teeth and/or dentures?: Yes Does patient usually wear dentures?: No  CIWA:  CIWA-Ar Total: 0 COWS:  COWS Total Score: 1  Musculoskeletal: Strength & Muscle Tone: within normal limits Gait & Station: normal Patient leans: N/A  Psychiatric Specialty Exam: Review of Systems  Constitutional: Negative.   HENT: Negative.   Eyes: Negative.   Cardiovascular: Negative.   Gastrointestinal: Negative.   Genitourinary: Negative.   Musculoskeletal: Negative.   Skin: Negative.   Neurological: Negative.   Endo/Heme/Allergies: Negative.   Psychiatric/Behavioral: Positive for depression (Stable) and substance abuse (Hx. Alcoholism, chronic). Negative for suicidal ideas, hallucinations and memory loss. The patient has insomnia (Stable). The patient is not nervous/anxious.     Blood pressure 119/74, pulse 102, temperature 97.4 F (36.3 C),  temperature source Oral, resp. rate 18, height 5\' 10"  (1.778 m), weight 79.379 kg (175 lb).Body mass index is 25.11 kg/(m^2).  See Md's SRA   Have you used any form of tobacco in the last 30 days? (Cigarettes, Smokeless Tobacco, Cigars, and/or Pipes): Yes  Has this patient used any form of tobacco in the last 30 days? (Cigarettes, Smokeless Tobacco, Cigars, and/or Pipes): Yes, Nicotine patch prescription provided.  Blood Alcohol level:  Lab Results  Component Value Date   ETH 273* [id number]   Metabolic Disorder Labs:  No results found for: HGBA1C, MPG No results found for: PROLACTIN No results found for: CHOL, TRIG, HDL, CHOLHDL, VLDL, LDLCALC  See Psychiatric Specialty Exam and Suicide Risk Assessment completed by Attending Physician prior to discharge.  Discharge destination:  Home  Is patient on multiple antipsychotic therapies at discharge:  No   Has Patient had three or more failed trials of antipsychotic monotherapy by history:  No  Recommended Plan for Multiple Antipsychotic Therapies: NA    Medication List    TAKE these medications      Indication   DULoxetine 30 MG capsule  Commonly known as:  CYMBALTA  Take 1 capsule (30 mg total) by mouth daily. For depression   Indication:  Major Depressive Disorder     gabapentin 100 MG capsule  Commonly known as:  NEURONTIN  Take 1 capsule (100 mg total) by mouth 3 (three) times daily. For agitation   Indication:  Agitation, Alcohol Withdrawal  Syndrome     nicotine 21 mg/24hr patch  Commonly known as:  NICODERM CQ - dosed in mg/24 hours  Place 1 patch (21 mg total) onto the skin daily. For smoking cessation   Indication:  Nicotine Addiction     traZODone 50 MG tablet  Commonly known as:  DESYREL  Take 1 tablet (50 mg total) by mouth at bedtime. For insomnia   Indication:  Trouble Sleeping       Follow-up Information    Follow up with [facility] On 03/23/2016.   Why:  You have been accepted to [facility] on this date. Please  make sure you have 14 day medication supply. [facility] transport will pick you up at 12:30PM today.    Contact information:   [place], [place] [address] Phone: [phone] Fax: [phone]     Follow-up recommendations: Activity:  As tolerated Diet: As recommended by your primary care doctor. Keep all scheduled follow-up appointments as recommended.   Comments: Take all your medications as prescribed by your mental healthcare provider. Report any adverse effects and or reactions from your medicines to your outpatient provider promptly. Patient is instructed and cautioned to not engage in alcohol and or illegal drug use while on prescription medicines. In the event of worsening symptoms, patient is instructed to call the crisis hotline, 911 and or go to the nearest ED for appropriate evaluation and treatment of symptoms. Follow-up with your primary care provider for your other medical issues, concerns and or health care needs.   Signed: Encarnacion Slates, NP, PMHNP, FNP-BC 03/23/2016, 12:07 PM  I personally assessed the patient and formulated the plan Geralyn Flash A. Sabra Heck, M.D.

## 2016-03-23 NOTE — Tx Team (Signed)
Interdisciplinary Treatment Plan Update (Adult)  Date:  03/23/2016  Time Reviewed:  12:03 PM   Progress in Treatment: Attending groups: No. Participating in groups:  No. Taking medication as prescribed:  Yes. Tolerating medication:  Yes. Family/Significant othe contact made:  SPE completed with pt's girlfriend.  Patient understands diagnosis:  Yes. and As evidenced by:  seeking treatment for depression, SI with a plan, alcohol abuse, and for medication stabilization Discussing patient identified problems/goals with staff:  Yes. Medical problems stabilized or resolved:  Yes. Denies suicidal/homicidal ideation: Yes. Issues/concerns per patient self-inventory:  Other:  Discharge Plan or Barriers: Pt accepted to [facility] for today. Driver will pick him up at 12:30PM and pt must have 14 day supply. MD/RN/NP and Jiles Garter in Pharmacy have been notified. 03/23/2016 12:04 PM   Reason for Continuation of Hospitalization: None  Comments:  Kyle Mejia is an 60 y.o. male who voluntarily presents to [facility] with c/o SI and Alcohol abuse. Pt indicates that he has been drinking daily for over 20 years. Pt was tearful, as he conveyed his feelings of not being worth it and his recent thoughts of suicide. Pt shared that he had a plan to hang himself 2 days ago, but didn't go thru with it b/c he thought of all of the people he would be hurting. Pt denies having HI or AVH, but endorses having current SI. Pt presented as clear and cogent in his speech, although his BAL was at 273. Diagnosis: MDD, single episode, severe; Alcohol Use Disorder, severe  Estimated length of stay:  D/c today and accepted directly to [facility].   Additional Comments:  Patient and CSW reviewed pt's identified goals and treatment plan. Patient verbalized understanding and agreed to treatment plan. CSW reviewed [facility] "Discharge Process and Patient Involvement" Form. Pt verbalized understanding of information provided and signed form.    Review of  initial/current patient goals per problem list:  1. Goal(s): Patient will participate in aftercare plan  Met: Yes  Target date: at discharge  As evidenced by: Patient will participate within aftercare plan AEB aftercare provider and housing plan at discharge being identified.  5/1: Pt being referred to [facility].   5/2: Pt accepted to [facility] for today.   2. Goal (s): Patient will exhibit decreased depressive symptoms and suicidal ideations.  Met: Yes   Target date: at discharge  As evidenced by: Patient will utilize self rating of depression at 3 or below and demonstrate decreased signs of depression or be deemed stable for discharge by MD.  5/1: Pt reports high depression. No SI/HI/AVH.  5/2: Pt reports depression as 2/10 and presents with pleasant mood/calm affect. Denies SI/Hi/AVH.   3. Goal(s): Patient will demonstrate decreased signs of withdrawal due to substance abuse  Met:Yes  Target date:at discharge   As evidenced by: Patient will produce a CIWA/COWS score of 0, have stable vitals signs, and no symptoms of withdrawal.  5/1: Pt reports mild withdrawals with CIWA score of 2 and high BP.  5/2: Pt reports no signs of withdrawal with CIWA score of 0 and stable vitals. Goal met.   Attendees: Patient:   03/23/2016 12:03 PM   Family:   03/23/2016 12:03 PM   Physician:  Dr. Carlton Adam, MD 03/23/2016 12:03 PM   Nursing:   Lisbeth Renshaw RN  03/23/2016 12:03 PM   Clinical Social Worker: Maxie Better, LCSW 03/23/2016 12:03 PM   Clinical Social Worker: Erasmo Downer Drinkard LCSW 03/23/2016 12:03 PM   Other:  Gerline Legacy Nurse Case  Manager 03/23/2016 12:03 PM   Other:  Agustina Caroli NP 03/23/2016 12:03 PM   Other:   03/23/2016 12:03 PM   Other:  03/23/2016 12:03 PM   Other:  03/23/2016 12:03 PM   Other:  03/23/2016 12:03 PM    03/23/2016 12:03 PM    03/23/2016 12:03 PM    03/23/2016 12:03 PM    03/23/2016 12:03 PM    Scribe for Treatment Team:   Maxie Better, LCSW 03/23/2016 12:03 PM

## 2016-04-16 ENCOUNTER — Ambulatory Visit: Payer: Self-pay | Attending: Internal Medicine

## 2016-04-22 MED FILL — GABAPENTIN 100 MG CAPSULE: 100 | 30 days supply | Qty: 90 | Fill #0

## 2016-04-22 MED FILL — DULoxetine HCL 30 MG CPEP: 30 | 30 days supply | Qty: 30 | Fill #0

## 2016-05-19 ENCOUNTER — Encounter: Payer: Self-pay | Admitting: Family Medicine

## 2016-05-19 ENCOUNTER — Ambulatory Visit (INDEPENDENT_AMBULATORY_CARE_PROVIDER_SITE_OTHER): Payer: Self-pay | Admitting: Family Medicine

## 2016-05-19 DIAGNOSIS — R634 Abnormal weight loss: Secondary | ICD-10-CM

## 2016-05-19 DIAGNOSIS — F329 Major depressive disorder, single episode, unspecified: Secondary | ICD-10-CM

## 2016-05-19 DIAGNOSIS — G8929 Other chronic pain: Secondary | ICD-10-CM

## 2016-05-19 DIAGNOSIS — M25512 Pain in left shoulder: Secondary | ICD-10-CM

## 2016-05-19 DIAGNOSIS — Z23 Encounter for immunization: Secondary | ICD-10-CM

## 2016-05-19 DIAGNOSIS — M25562 Pain in left knee: Secondary | ICD-10-CM

## 2016-05-19 DIAGNOSIS — I10 Essential (primary) hypertension: Secondary | ICD-10-CM | POA: Insufficient documentation

## 2016-05-19 DIAGNOSIS — G629 Polyneuropathy, unspecified: Secondary | ICD-10-CM

## 2016-05-19 DIAGNOSIS — Z1211 Encounter for screening for malignant neoplasm of colon: Secondary | ICD-10-CM

## 2016-05-19 DIAGNOSIS — F32A Depression, unspecified: Secondary | ICD-10-CM | POA: Insufficient documentation

## 2016-05-19 DIAGNOSIS — F1011 Alcohol abuse, in remission: Secondary | ICD-10-CM | POA: Insufficient documentation

## 2016-05-19 DIAGNOSIS — F1010 Alcohol abuse, uncomplicated: Secondary | ICD-10-CM

## 2016-05-19 DIAGNOSIS — R030 Elevated blood-pressure reading, without diagnosis of hypertension: Secondary | ICD-10-CM

## 2016-05-19 DIAGNOSIS — IMO0001 Reserved for inherently not codable concepts without codable children: Secondary | ICD-10-CM

## 2016-05-19 DIAGNOSIS — M25569 Pain in unspecified knee: Secondary | ICD-10-CM

## 2016-05-19 DIAGNOSIS — F17200 Nicotine dependence, unspecified, uncomplicated: Secondary | ICD-10-CM

## 2016-05-19 LAB — POCT URINALYSIS DIP (DEVICE)
GLUCOSE, UA: NEGATIVE mg/dL
Hgb urine dipstick: NEGATIVE
KETONES UR: NEGATIVE mg/dL
LEUKOCYTES UA: NEGATIVE
Nitrite: NEGATIVE
Protein, ur: NEGATIVE mg/dL
SPECIFIC GRAVITY, URINE: 1.025 (ref 1.005–1.030)
Urobilinogen, UA: 2 mg/dL — ABNORMAL HIGH (ref 0.0–1.0)
pH: 5 (ref 5.0–8.0)

## 2016-05-19 LAB — CBC WITH DIFFERENTIAL/PLATELET
BASOS ABS: 65 {cells}/uL (ref 0–200)
Basophils Relative: 1 %
EOS ABS: 65 {cells}/uL (ref 15–500)
EOS PCT: 1 %
HCT: 42.6 % (ref 38.5–50.0)
Hemoglobin: 14.6 g/dL (ref 13.2–17.1)
LYMPHS ABS: 1755 {cells}/uL (ref 850–3900)
LYMPHS PCT: 27 %
MCH: 33 pg (ref 27.0–33.0)
MCHC: 34.3 g/dL (ref 32.0–36.0)
MCV: 96.4 fL (ref 80.0–100.0)
MONO ABS: 780 {cells}/uL (ref 200–950)
MONOS PCT: 12 %
MPV: 12.2 fL (ref 7.5–12.5)
Neutro Abs: 3835 cells/uL (ref 1500–7800)
Neutrophils Relative %: 59 %
PLATELETS: 109 10*3/uL — AB (ref 140–400)
RBC: 4.42 MIL/uL (ref 4.20–5.80)
RDW: 13.4 % (ref 11.0–15.0)
WBC: 6.5 10*3/uL (ref 3.8–10.8)

## 2016-05-19 MED ORDER — FOLIC ACID 1 MG PO TABS: 1.0000 mg | ORAL_TABLET | Freq: Every day | ORAL | Status: DC

## 2016-05-19 MED ORDER — DULOXETINE HCL 20 MG PO CPEP: 20.0000 mg | ORAL_CAPSULE | Freq: Every day | ORAL | Status: DC

## 2016-05-19 MED ORDER — GABAPENTIN 300 MG PO CAPS: 300.0000 mg | ORAL_CAPSULE | Freq: Three times a day (TID) | ORAL | Status: DC

## 2016-05-19 MED ORDER — VITAMIN B-1 100 MG PO TABS: 100.0000 mg | ORAL_TABLET | Freq: Every day | ORAL | Status: DC

## 2016-05-19 NOTE — Patient Instructions (Addendum)
Will decrease Cymbalta to 20 mg at bedtime.  Will increase Gabapentin to 300 mg three times per day for nerve pain in left shoulder Will defer to orthopedic specialist for chronic left shoulder pain.    Shoulder Pain The shoulder is the joint that connects your arms to your body. The bones that form the shoulder joint include the upper arm bone (humerus), the shoulder blade (scapula), and the collarbone (clavicle). The top of the humerus is shaped like a ball and fits into a rather flat socket on the scapula (glenoid cavity). A combination of muscles and strong, fibrous tissues that connect muscles to bones (tendons) support your shoulder joint and hold the ball in the socket. Small, fluid-filled sacs (bursae) are located in different areas of the joint. They act as cushions between the bones and the overlying soft tissues and help reduce friction between the gliding tendons and the bone as you move your arm. Your shoulder joint allows a wide range of motion in your arm. This range of motion allows you to do things like scratch your back or throw a ball. However, this range of motion also makes your shoulder more prone to pain from overuse and injury. Causes of shoulder pain can originate from both injury and overuse and usually can be grouped in the following four categories:  Redness, swelling, and pain (inflammation) of the tendon (tendinitis) or the bursae (bursitis).  Instability, such as a dislocation of the joint.  Inflammation of the joint (arthritis).  Broken bone (fracture). HOME CARE INSTRUCTIONS   Apply ice to the sore area.  Put ice in a plastic bag.  Place a towel between your skin and the bag.  Leave the ice on for 15-20 minutes, 3-4 times per day for the first 2 days, or as directed by your health care provider.  Stop using cold packs if they do not help with the pain.  If you have a shoulder sling or immobilizer, wear it as long as your caregiver instructs. Only remove it  to shower or bathe. Move your arm as little as possible, but keep your hand moving to prevent swelling.  Squeeze a soft ball or foam pad as much as possible to help prevent swelling.  Only take over-the-counter or prescription medicines for pain, discomfort, or fever as directed by your caregiver. SEEK MEDICAL CARE IF:   Your shoulder pain increases, or new pain develops in your arm, hand, or fingers.  Your hand or fingers become cold and numb.  Your pain is not relieved with medicines. SEEK IMMEDIATE MEDICAL CARE IF:   Your arm, hand, or fingers are numb or tingling.  Your arm, hand, or fingers are significantly swollen or turn white or blue. MAKE SURE YOU:   Understand these instructions.  Will watch your condition.  Will get help right away if you are not doing well or get worse.   This information is not intended to replace advice given to you by your health care provider. Make sure you discuss any questions you have with your health care provider.   Document Released: 08/18/2005 Document Revised: 11/29/2014 Document Reviewed: 03/03/2015 Elsevier Interactive Patient Education 2016 [employer] Knee Pain Knee pain is a very common symptom and can have many causes. Knee pain often goes away when you follow your health care provider's instructions for relieving pain and discomfort at home. However, knee pain can develop into a condition that needs treatment. Some conditions may include:  Arthritis caused by wear and tear (osteoarthritis).  Arthritis caused by swelling and irritation (rheumatoid arthritis or gout).  A cyst or growth in your knee.  An infection in your knee joint.  An injury that will not heal.  Damage, swelling, or irritation of the tissues that support your knee (torn ligaments or tendinitis). If your knee pain continues, additional tests may be ordered to diagnose your condition. Tests may include X-rays or other imaging studies of your knee. You may  also need to have fluid removed from your knee. Treatment for ongoing knee pain depends on the cause, but treatment may include:  Medicines to relieve pain or swelling.  Steroid injections in your knee.  Physical therapy.  Surgery. HOME CARE INSTRUCTIONS  Take medicines only as directed by your health care provider.  Rest your knee and keep it raised (elevated) while you are resting.  Do not do things that cause or worsen pain.  Avoid high-impact activities or exercises, such as running, jumping rope, or doing jumping jacks.  Apply ice to the knee area:  Put ice in a plastic bag.  Place a towel between your skin and the bag.  Leave the ice on for 20 minutes, 2-3 times a day.  Ask your health care provider if you should wear an elastic knee support.  Keep a pillow under your knee when you sleep.  Lose weight if you are overweight. Extra weight can put pressure on your knee.  Do not use any tobacco products, including cigarettes, chewing tobacco, or electronic cigarettes. If you need help quitting, ask your health care provider. Smoking may slow the healing of any bone and joint problems that you may have. SEEK MEDICAL CARE IF:  Your knee pain continues, changes, or gets worse.  You have a fever along with knee pain.  Your knee buckles or locks up.  Your knee becomes more swollen. SEEK IMMEDIATE MEDICAL CARE IF:   Your knee joint feels hot to the touch.  You have chest pain or trouble breathing.   This information is not intended to replace advice given to you by your health care provider. Make sure you discuss any questions you have with your health care provider.   Document Released: 09/05/2007 Document Revised: 11/29/2014 Document Reviewed: 06/24/2014 Elsevier Interactive Patient Education 2016 [employer]. Alcohol Abuse and Nutrition Alcohol abuse is any pattern of alcohol consumption that harms your health, relationships, or work. Alcohol abuse can affect  how your body breaks down and absorbs nutrients from food by causing your liver to work abnormally. Additionally, many people who abuse alcohol do not eat enough carbohydrates, protein, fat, vitamins, and minerals. This can cause poor nutrition (malnutrition) and a lack of nutrients (nutrient deficiencies), which can lead to further complications. Nutrients that are commonly lacking (deficient) among people who abuse alcohol include:  Vitamins.  Vitamin A. This is stored in your liver. It is important for your vision, metabolism, and ability to fight off infections (immunity).  B vitamins. These include vitamins such as folate, thiamin, and niacin. These are important in new cell growth and maintenance.  Vitamin C. This plays an important role in iron absorption, wound healing, and immunity.  Vitamin D. This is produced by your liver, but you can also get vitamin D from food. Vitamin D is necessary for your body to absorb and use calcium.  Minerals.  Calcium. This is important for your bones and your heart and blood vessel (cardiovascular) function.  Iron. This is important for blood, muscle, and nervous system functioning.  Magnesium.  This plays an important role in muscle and nerve function, and it helps to control blood sugar and blood pressure.  Zinc. This is important for the normal function of your nervous system and digestive system (gastrointestinal tract). Nutrition is an essential component of therapy for alcohol abuse. Your health care provider or dietitian will work with you to design a plan that can help restore nutrients to your body and prevent potential complications. WHAT IS MY PLAN? Your dietitian may develop a specific diet plan that is based on your condition and any other complications you may have. A diet plan will commonly include:  A balanced diet.  Grains: 6-8 oz per day.  Vegetables: 2-3 cups per day.  Fruits: 1-2 cups per day.  Meat and other protein: 5-6  oz per day.  Dairy: 2-3 cups per day.  Vitamin and mineral supplements. WHAT DO I NEED TO KNOW ABOUT ALCOHOL AND NUTRITION?  Consume foods that are high in antioxidants, such as grapes, berries, nuts, green tea, and dark green and orange vegetables. This can help to counteract some of the stress that is placed on your liver by consuming alcohol.  Avoid food and drinks that are high in fat and sugar. Foods such as sugared soft drinks, salty snack foods, and candy contain empty calories. This means that they lack important nutrients such as protein, fiber, and vitamins.  Eat frequent meals and snacks. Try to eat 5-6 small meals each day.  Eat a variety of fresh fruits and vegetables each day. This will help you get plenty of water, fiber, and vitamins in your diet.  Drink plenty of water and other clear fluids. Try to drink at least 48-64 oz (1.5-2 L) of water per day.  If you are a vegetarian, eat a variety of protein-rich foods. Pair whole grains with plant-based proteins at meals and snacks to obtain the greatest nutrient benefit from your food. For example, eat rice with beans, put peanut butter on whole-grain toast, or eat oatmeal with sunflower seeds.  Soak beans and whole grains overnight before cooking. This can help your body to absorb the nutrients more easily.  Include foods fortified with vitamins and minerals in your diet. Commonly fortified foods include milk, orange juice, cereal, and bread.  If you are malnourished, your dietitian may recommend a high-protein, high-calorie diet. This may include:  2,000-3,000 calories (kilocalories) per day.  70-100 grams of protein per day.  Your health care provider may recommend a complete nutritional supplement beverage. This can help to restore calories, protein, and vitamins to your body. Depending on your condition, you may be advised to consume this instead of or in addition to meals.  Limit your intake of caffeine. Replace drinks  like coffee and black tea with decaffeinated coffee and herbal tea.  Eat a variety of foods that are high in omega fatty acids. These include fish, nuts and seeds, and soybeans. These foods may help your liver to recover and may also stabilize your mood.  Certain medicines may cause changes in your appetite, taste, and weight. Work with your health care provider and dietitian to make any adjustments to your medicines and diet plan.  Include other healthy lifestyle choices in your daily routine.  Be physically active.  Get enough sleep.  Spend time doing activities that you enjoy.  If you are unable to take in enough food and calories by mouth, your health care provider may recommend a feeding tube. This is a tube that passes through your  nose and throat, directly into your stomach. Nutritional supplement beverages can be given to you through the feeding tube to help you get the nutrients you need.  Take vitamin or mineral supplements as recommended by your health care provider. WHAT FOODS CAN I EAT? Grains Enriched pasta. Enriched rice. Fortified whole-grain bread. Fortified whole-grain cereal. Barley. Brown rice. Quinoa. Morton. Vegetables All fresh, frozen, and canned vegetables. Spinach. Kale. Artichoke. Carrots. Winter squash and pumpkin. Sweet potatoes. Broccoli. Cabbage. Cucumbers. Tomatoes. Sweet peppers. Green beans. Peas. Corn. Fruits All fresh and frozen fruits. Berries. Grapes. Mango. Papaya. Guava. Cherries. Apples. Bananas. Peaches. Plums. Pineapple. Watermelon. Cantaloupe. Oranges. Avocado. Meats and Other Protein Sources Beef liver. Lean beef. Pork. Fresh and canned chicken. Fresh fish. Oysters. Sardines. Canned tuna. Shrimp. Eggs with yolks. Nuts and seeds. Peanut butter. Beans and lentils. Soybeans. Tofu. Dairy Whole, low-fat, and nonfat milk. Whole, low-fat, and nonfat yogurt. Cottage cheese. Sour cream. Hard and soft cheeses. Beverages Water. Herbal tea. Decaffeinated  coffee. Decaffeinated green tea. 100% fruit juice. 100% vegetable juice. Instant breakfast shakes. Condiments Ketchup. Mayonnaise. Mustard. Salad dressing. Barbecue sauce. Sweets and Desserts Sugar-free ice cream. Sugar-free pudding. Sugar-free gelatin. Fats and Oils Butter. Vegetable oil, flaxseed oil, olive oil, and walnut oil. Other Complete nutrition shakes. Protein bars. Sugar-free gum. The items listed above may not be a complete list of recommended foods or beverages. Contact your dietitian for more options. WHAT FOODS ARE NOT RECOMMENDED? Grains Sugar-sweetened breakfast cereals. Flavored instant oatmeal. Fried breads. Vegetables Breaded or deep-fried vegetables. Fruits Dried fruit with added sugar. Candied fruit. Canned fruit in syrup. Meats and Other Protein Sources Breaded or deep-fried meats. Dairy Flavored milks. Fried cheese curds or fried cheese sticks. Beverages Alcohol. Sugar-sweetened soft drinks. Sugar-sweetened tea. Caffeinated coffee and tea. Condiments Sugar. Honey. Agave nectar. Molasses. Sweets and Desserts Chocolate. Cake. Cookies. Candy. Other Potato chips. Pretzels. Salted nuts. Candied nuts. The items listed above may not be a complete list of foods and beverages to avoid. Contact your dietitian for more information.   This information is not intended to replace advice given to you by your health care provider. Make sure you discuss any questions you have with your health care provider.   Document Released: 09/02/2005 Document Revised: 11/29/2014 Document Reviewed: 06/11/2014 Elsevier Interactive Patient Education [employer].

## 2016-05-19 NOTE — Progress Notes (Signed)
Subjective:    Patient ID: Kyle Mejia, male    DOB: January 24, 1956, 60 y.o.   MRN: [id number]  HPI [name], a 60 year old male with a history of alcohol dependence presents accompanied by wife to establish care. He has not had a primary provider and has been using the emergency department or urgent care for all primary needs. He says that he was recently admitted to inpatient rehabilitation for alcohol use. It has been greater than 60 days since his last alcoholic beverage. He reports a history of depression and was started on antidepressant medication while in rehabilitation. Depression symptoms include depressed mood and fatigue. He has been taking Cymbalta consistently. His spouse says that he typically sleeps all day and has no motivation.  He denies current suicidal and homicidal plan or intent.  Left knee surgery, reconstruction surgery.  Patient reports a history of left knee pain. He maintains that he was involved in an accident many years ago in [place] and underwent reconstructive surgery to left knee. He complains of diffuculty with ambulation and chronic pain. He says that current pain intensity is 7/10 described as constant and aching.  Pain is aggravated by going up and down stairs, inactivity, kneeling, lateral movements, pivoting, rising after sitting, squatting, standing and walking.  He has not attempted any OTC medications to alleviate symptoms.   Patient is also complaining of left shoulder pain. He says that pain has been present over the past year. He has not identified any inciting events leading to current left shoulder pain. He says that shoulder pain is constant and shooting. Pain often radiates to fingertips. He has numbness and tingling to fingertips of left hand. He says that pain is aggravated by lifting, lying on affected area, and increased activity.    Past Medical History  Diagnosis Date  . History of alcoholism ([place])   . Chronic left shoulder pain    . Chronic pain of left knee   . Tobacco dependence     Immunization History  Administered Date(s) Administered  . Pneumococcal Polysaccharide-23 05/19/2016  . Tdap 05/19/2016    No Known Allergies  Social History   Social History  . Marital Status: Single    Spouse Name: N/A  . Number of Children: N/A  . Years of Education: N/A   Occupational History  . Not on file.   Social History Main Topics  . Smoking status: Current Every Day Smoker -- 1.00 packs/day    Types: Cigarettes  . Smokeless tobacco: Not on file  . Alcohol Use: Yes     Comment: daily  . Drug Use: Not on file  . Sexual Activity: Not on file   Other Topics Concern  . Not on file   Social History Narrative   Past Surgical History  Procedure Laterality Date  . Knee reconstruction      left knee    Review of Systems  Constitutional: Positive for unexpected weight change (weight loss).  HENT: Positive for dental problem.   Eyes: Negative.  Negative for photophobia and visual disturbance.  Respiratory: Negative.   Cardiovascular: Negative.  Negative for chest pain, palpitations and leg swelling.  Gastrointestinal: Negative.  Negative for abdominal pain, diarrhea, abdominal distention and rectal pain.  Endocrine: Negative.  Negative for polydipsia, polyphagia and polyuria.  Genitourinary: Negative.   Musculoskeletal: Positive for myalgias (Left shoulder pain and left knee pain).  Skin: Negative.  Negative for rash.  Allergic/Immunologic: Negative.  Negative for immunocompromised state.  Neurological:  Positive for tremors, weakness (left shoulder and left knee) and numbness (left hand).  Hematological: Negative.   Psychiatric/Behavioral: Negative.        Depression       Objective:   Physical Exam  Constitutional: He is oriented to person, place, and time. He appears well-developed and well-nourished.  HENT:  Head: Normocephalic and atraumatic.  Right Ear: External ear normal.  Left Ear: External  ear normal.  Nose: Nose normal.  Mouth/Throat: Oropharynx is clear and moist. Abnormal dentition (partially edentulous).  Eyes: Conjunctivae and EOM are normal. Pupils are equal, round, and reactive to light.  Neck: Normal range of motion. Neck supple.  Pulmonary/Chest: Effort normal and breath sounds normal.  Abdominal: Soft. Bowel sounds are normal.  Musculoskeletal:       Left shoulder: He exhibits decreased range of motion, tenderness, pain and decreased strength (3/5). He exhibits no swelling and no crepitus.       Left knee: He exhibits decreased range of motion, swelling, deformity and abnormal patellar mobility. He exhibits no erythema.  Neurological: He is alert and oriented to person, place, and time. He has normal reflexes.  Skin: Skin is warm and dry.  Psychiatric: He has a normal mood and affect. His behavior is normal. Judgment and thought content normal.      BP 140/72 mmHg  Pulse 82  Temp(Src) 98.2 F (36.8 C) (Oral)  Resp 16  Ht 5\' 10"  (1.778 m)  Wt 177 lb (80.287 kg)  BMI 25.40 kg/m2  SpO2 98% Assessment & Plan:  1. Chronic left shoulder pain Will continue Gabapentin and increase dosage to 300 mg TID for neuropathy. Will defer to orthopedic specialist for further treatment and evaluation - Ambulatory referral to Orthopedic Surgery  2. Chronic knee pain, left - Ambulatory referral to Orthopedic Surgery  3. History of alcohol abuse - folic acid (FOLVITE) 1 MG tablet; Take 1 tablet (1 mg total) by mouth daily.  Dispense: 30 tablet; Refill: 11 - thiamine (VITAMIN B-1) 100 MG tablet; Take 1 tablet (100 mg total) by mouth daily.  Dispense: 30 tablet; Refill: 11  4. Loss of weight - CBC with Differential - Hemoglobin A1c - COMPLETE METABOLIC PANEL WITH GFR - TSH - HIV antibody (with reflex) - Hepatitis panel, acute - POCT urinalysis dip (device)  5. Elevated blood pressure Will follow up in 1 month for a BP check.  - Lipid Panel  6. Neuropathy (HCC) -  gabapentin (NEURONTIN) 300 MG capsule; Take 1 capsule (300 mg total) by mouth 3 (three) times daily. For agitation  Dispense: 90 capsule; Refill: 1  7. Depression Will decrease Cymbalta to 20 mg daily.  - DULoxetine (CYMBALTA) 20 MG capsule; Take 1 capsule (20 mg total) by mouth at bedtime. For depression  Dispense: 30 capsule; Refill: 1  8. Colon cancer screening - Ambulatory referral to Gastroenterology  9. Need for Tdap vaccination - Tdap vaccine greater than or equal to 7yo IM  10. Immunization due - Pneumococcal polysaccharide vaccine 23-valent greater than or equal to 2yo subcutaneous/IM  11. Tobacco dependence Smoking cessation instruction/counseling given:  counseled patient on the dangers of tobacco use, advised patient to stop smoking, and reviewed strategies to maximize success     Routine Health Maintenance:   Prostate exam: will complete at follow up appt.  Recommend routine colonoscopy Recommend yearly opthalmology exam   Dorena Dew, FNP

## 2016-05-20 LAB — HEPATITIS PANEL, ACUTE
HCV Ab: REACTIVE — AB
HEP A IGM: NONREACTIVE
HEP B C IGM: NONREACTIVE
Hepatitis B Surface Ag: NEGATIVE

## 2016-05-20 LAB — COMPLETE METABOLIC PANEL WITH GFR
ALBUMIN: 3.3 g/dL — AB (ref 3.6–5.1)
ALK PHOS: 133 U/L — AB (ref 40–115)
ALT: 44 U/L (ref 9–46)
AST: 47 U/L — ABNORMAL HIGH (ref 10–35)
BUN: 17 mg/dL (ref 7–25)
CALCIUM: 8.5 mg/dL — AB (ref 8.6–10.3)
CO2: 21 mmol/L (ref 20–31)
Chloride: 107 mmol/L (ref 98–110)
Creat: 0.61 mg/dL — ABNORMAL LOW (ref 0.70–1.25)
Glucose, Bld: 104 mg/dL — ABNORMAL HIGH (ref 65–99)
POTASSIUM: 3.9 mmol/L (ref 3.5–5.3)
Sodium: 141 mmol/L (ref 135–146)
Total Bilirubin: 1.2 mg/dL (ref 0.2–1.2)
Total Protein: 6.8 g/dL (ref 6.1–8.1)

## 2016-05-20 LAB — HEMOGLOBIN A1C
Hgb A1c MFr Bld: 5 % (ref <5.7)
Mean Plasma Glucose: 97 mg/dL

## 2016-05-20 LAB — LIPID PANEL
CHOLESTEROL: 133 mg/dL (ref 125–200)
HDL: 39 mg/dL — AB (ref >=40)
LDL Cholesterol: 79 mg/dL (ref <130)
TRIGLYCERIDES: 73 mg/dL (ref <150)
Total CHOL/HDL Ratio: 3.4 Ratio (ref <=5.0)
VLDL: 15 mg/dL (ref <30)

## 2016-05-20 LAB — HIV ANTIBODY (ROUTINE TESTING W REFLEX): HIV: NONREACTIVE

## 2016-05-20 LAB — TSH: TSH: 3 m[IU]/L (ref 0.40–4.50)

## 2016-05-21 ENCOUNTER — Other Ambulatory Visit: Payer: Self-pay | Admitting: Family Medicine

## 2016-05-21 DIAGNOSIS — B182 Chronic viral hepatitis C: Secondary | ICD-10-CM | POA: Insufficient documentation

## 2016-05-21 DIAGNOSIS — B1920 Unspecified viral hepatitis C without hepatic coma: Secondary | ICD-10-CM

## 2016-05-21 LAB — HEPATITIS C RNA QUANTITATIVE
HCV Quantitative Log: 5.63 {Log} — ABNORMAL HIGH (ref <1.18)
HCV Quantitative: 423512 IU/mL — ABNORMAL HIGH (ref <15)

## 2016-05-21 NOTE — Progress Notes (Signed)
Called left message for patient to call back and left callback information. Thanks!

## 2016-05-21 NOTE — Progress Notes (Signed)
[name], a 60 year old male that presented on 05/20/2016 to establish care. Patient was found to have elevated liver enzymes. Ordered acute hepatitis panel for screening. Hepatitis C positive confirmed by quantitative HCV. Will send referral to infectious disease for further evaluation and management.   Dorena Dew, FNP

## 2016-05-21 NOTE — Progress Notes (Signed)
Called and spoke with patient. Advised of results and that he will be referred to Infectious disease. Patient verbalized understanding. Thanks!

## 2016-06-02 ENCOUNTER — Encounter: Payer: Self-pay | Admitting: Student

## 2016-06-02 ENCOUNTER — Ambulatory Visit
Admission: RE | Admit: 2016-06-02 | Discharge: 2016-06-02 | Disposition: A | Discharge status: Home / Self Care | Payer: No Typology Code available for payment source | Source: Ambulatory Visit | Attending: Student | Admitting: Student

## 2016-06-02 ENCOUNTER — Ambulatory Visit (INDEPENDENT_AMBULATORY_CARE_PROVIDER_SITE_OTHER): Payer: Self-pay | Admitting: Student

## 2016-06-02 VITALS — BP 107/69 | Ht 71.0 in | Wt 177.0 lb

## 2016-06-02 DIAGNOSIS — M21162 Varus deformity, not elsewhere classified, left knee: Secondary | ICD-10-CM

## 2016-06-02 DIAGNOSIS — G8929 Other chronic pain: Secondary | ICD-10-CM

## 2016-06-02 DIAGNOSIS — M25562 Pain in left knee: Secondary | ICD-10-CM

## 2016-06-02 DIAGNOSIS — G5600 Carpal tunnel syndrome, unspecified upper limb: Secondary | ICD-10-CM | POA: Insufficient documentation

## 2016-06-02 DIAGNOSIS — G5602 Carpal tunnel syndrome, left upper limb: Secondary | ICD-10-CM

## 2016-06-02 DIAGNOSIS — M25512 Pain in left shoulder: Secondary | ICD-10-CM

## 2016-06-02 MED ORDER — MELOXICAM 7.5 MG PO TABS: 7.5000 mg | ORAL_TABLET | Freq: Every day | ORAL | Status: DC
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
## 2016-06-02 NOTE — Assessment & Plan Note (Signed)
Will get x-rays and prescribed mobic for pain relief.  Will review these with patient next week and discuss options.  Can consider injections vs surgical referral.  Offered knee bracing for patient, but he refused.  He has knee braces at home.

## 2016-06-02 NOTE — Assessment & Plan Note (Signed)
Likely OA vs chronic rotator cuff tear or both.  Will get shoulder x-rays and follow up in 1 week.  Mobic for pain relief.  Can consider ultrasound if chronic rotator cuff tear not apparent on x-ray.  Will consider injection at next visit.

## 2016-06-02 NOTE — Assessment & Plan Note (Addendum)
Patient's symptoms consistent with carpal tunnel, but unable to reproduce on exam.  Will give patient a night splint.  Will reassess in 4-6 weeks.

## 2016-06-09 ENCOUNTER — Encounter: Payer: Self-pay | Admitting: Student

## 2016-06-09 ENCOUNTER — Encounter (INDEPENDENT_AMBULATORY_CARE_PROVIDER_SITE_OTHER): Payer: Self-pay

## 2016-06-09 ENCOUNTER — Ambulatory Visit (INDEPENDENT_AMBULATORY_CARE_PROVIDER_SITE_OTHER): Payer: Self-pay | Admitting: Student

## 2016-06-09 VITALS — BP 145/96 | HR 115 | Ht 71.0 in | Wt 177.0 lb

## 2016-06-09 DIAGNOSIS — F1020 Alcohol dependence, uncomplicated: Secondary | ICD-10-CM

## 2016-06-09 DIAGNOSIS — M1732 Unilateral post-traumatic osteoarthritis, left knee: Secondary | ICD-10-CM | POA: Insufficient documentation

## 2016-06-09 DIAGNOSIS — M1990 Unspecified osteoarthritis, unspecified site: Secondary | ICD-10-CM

## 2016-06-09 DIAGNOSIS — M19012 Primary osteoarthritis, left shoulder: Secondary | ICD-10-CM

## 2016-06-09 DIAGNOSIS — M1730 Unilateral post-traumatic osteoarthritis, unspecified knee: Secondary | ICD-10-CM

## 2016-06-09 DIAGNOSIS — M179 Osteoarthritis of knee, unspecified: Secondary | ICD-10-CM

## 2016-06-09 DIAGNOSIS — F17200 Nicotine dependence, unspecified, uncomplicated: Secondary | ICD-10-CM

## 2016-06-09 MED ORDER — MELOXICAM 7.5 MG PO TABS: 7.5000 mg | ORAL_TABLET | Freq: Every day | ORAL | Status: DC

## 2016-06-09 NOTE — Assessment & Plan Note (Signed)
Has improved since last week. We'll take Motrin as needed for pain. I offered injection today but patient would like to wait till it is flared up. Advised to return when necessary.

## 2016-06-09 NOTE — Assessment & Plan Note (Signed)
Left knee with severe arthritis in all compartments especially with medial compartment with complete loss of joint space. Over inferior to orthopedic surgery for potential knee replacement. He has tried steroid injections in the past without relief and will not like 1 today. He will take Mobic when needed.

## 2016-06-09 NOTE — Assessment & Plan Note (Signed)
Last alcoholic beverage was between 45-50 days ago. He is in a program currently and was counseled not to drink any alcohol before consult with the surgeon.

## 2016-06-09 NOTE — Patient Instructions (Addendum)
[facility] Durward Fortes, MD Monday 06/21/16 at 9:45am [address], [place],  [address] Phone: [phone]

## 2016-06-09 NOTE — Progress Notes (Signed)
Patient ID: Kyle Mejia, male   DOB: October 25, 1956, 60 y.o.   MRN: [id number]  RAHSAAN HIPPLER - 60 y.o. male MRN [id number]  Date of birth: 26-May-1956  SUBJECTIVE:  Including CC & ROS.  CC: left shoulder and knee pain follow up.     Presents for 1 week follow-up of his left shoulder and left knee pain. In regards to his left shoulder it is actually improved from last week. He did not receive his prescription for Mobic. He still does report some weakness compared to his right side. He would like to review his shoulder x-rays.   He would also like to review his left knee x-rays. His symptoms have not changed from last week and he is still having pain and swelling in his left knee. He has not been using a brace. Denies any radicular pain. He does still report numbness and paresthesia in his left first 3 digits. He states that at times he has paresthesia from his shoulder down to his hand.     HISTORY: Past Medical, Surgical, Social, and Family History Reviewed & Updated per EMR.   Pertinent Historical Findings include: PMHx -   history of alcohol abuse and hepatitis C.  PSHx -  He does report previous alcohol use but has not drank any alcohol in 50 days. He currently smokes a pack per day.  FHx -  none provided Medications - Neurontin   DATA REVIEWED:  three-view X-ray of left shoulder revealed AC joint arthritis and glenohumeral arthritis.   x-ray of left knee 4 view revealed advanced tricompartmetal degenerative changes with complete loss of joint space in medial and patellofemoral compartments.  Advance spurring noted as well PHYSICAL EXAM:  VS: BP:(!) 145/96 mmHg  HR:(!) 115bpm  TEMP: ( )  RESP:   HT:5\' 11"  (180.3 cm)   WT:177 lb (80.287 kg)  BMI:24.7 PHYSICAL EXAM: Gen: NAD, alert, cooperative with exam, well-appearing HEENT: clear conjunctiva,  CV:  no edema, capillary refill brisk, normal rate Resp: non-labored Skin: no rashes, normal turgor  Neuro: no gross deficits.    Psych:  alert and oriented  Shoulder: Inspection reveals no abnormalities, atrophy or asymmetry. Palpation shows tenderness over AC joint, none over bicipital groove. ROM is decreased in flexion to 160 degrees bilaterally. Rotator cuff strength intact throughout  + signs of impingement with + Neer and Hawkin's tests. Speeds and Yergason's tests normal. No labral pathology noted with negative Obrien's, negative clunk and good stability. Normal scapular function observed. No painful arc and no drop arm sign.  Knee: Inspection revealed bony hypertrophy over her medial compartment and varus angulation of the knee. Mild effusion present.  Ultrasound: Left shoulder ultrasound obtained.  Patient seated and draped. Left biceps tendon viewed a long and short axis revealed no hypoechoic changes. Subscapularis without hypoechoic changes.  Supraspinatus revealed mild edema and overlying humeral irregularities consistent with arthritic changes. There is also an area of calcification along the inferior portion of the tendon consistent with chronic calcific tendinopathy here.  Infraspinatus and teres minor viewed in long and short axis without hypoechoic changes.  AC joint revealed effusion surrounding joint with small bone spur.  Findings consistent with calcific supraspinatus tendinopathy with mild underlying arthritic changes.  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 Over inferior to orthopedic surgery for potential knee replacement. He has tried steroid injections in the  past without relief and will not like 1 today. He will take Mobic when needed.  Alcohol use disorder, severe, dependence ([place]) Last alcoholic beverage was between 45-50 days ago. He is in a program currently and was counseled not to drink any alcohol before consult with the  surgeon.  Tobacco dependence Counseled on smoking cessation.  Osteoarthritis of left glenohumeral joint Has improved since last week. We'll take Motrin as needed for pain. I offered injection today but patient would like to wait till it is flared up. Advised to return when necessary.  Arthritis of left acromioclavicular joint Arthritis seen on x-ray and ultrasound. Continue Mobic and was counseled below he could receive injection when necessary.

## 2016-06-09 NOTE — Assessment & Plan Note (Signed)
Counseled on smoking cessation  

## 2016-06-09 NOTE — Assessment & Plan Note (Signed)
Arthritis seen on x-ray and ultrasound. Continue Mobic and was counseled below he could receive injection when necessary.

## 2016-06-18 ENCOUNTER — Encounter: Payer: Self-pay | Admitting: Family Medicine

## 2016-06-22 ENCOUNTER — Ambulatory Visit: Payer: No Typology Code available for payment source | Attending: Internal Medicine

## 2016-06-24 ENCOUNTER — Ambulatory Visit: Payer: Self-pay | Admitting: Family Medicine

## 2016-06-29 ENCOUNTER — Other Ambulatory Visit: Payer: Self-pay | Admitting: Vascular Surgery

## 2016-06-29 DIAGNOSIS — R0989 Other specified symptoms and signs involving the circulatory and respiratory systems: Secondary | ICD-10-CM

## 2016-07-01 ENCOUNTER — Emergency Department (HOSPITAL_COMMUNITY)
Admission: EM | Admit: 2016-07-01 | Discharge: 2016-07-01 | Disposition: A | Discharge status: Home / Self Care | Payer: Self-pay | Attending: Emergency Medicine | Admitting: Emergency Medicine

## 2016-07-01 ENCOUNTER — Emergency Department (HOSPITAL_COMMUNITY): Payer: Self-pay

## 2016-07-01 ENCOUNTER — Encounter (HOSPITAL_COMMUNITY): Payer: Self-pay

## 2016-07-01 DIAGNOSIS — S129XXA Fracture of neck, unspecified, initial encounter: Secondary | ICD-10-CM | POA: Insufficient documentation

## 2016-07-01 DIAGNOSIS — R55 Syncope and collapse: Secondary | ICD-10-CM | POA: Insufficient documentation

## 2016-07-01 DIAGNOSIS — Y999 Unspecified external cause status: Secondary | ICD-10-CM | POA: Insufficient documentation

## 2016-07-01 DIAGNOSIS — F17210 Nicotine dependence, cigarettes, uncomplicated: Secondary | ICD-10-CM | POA: Insufficient documentation

## 2016-07-01 DIAGNOSIS — W11XXXA Fall on and from ladder, initial encounter: Secondary | ICD-10-CM | POA: Insufficient documentation

## 2016-07-01 DIAGNOSIS — Y929 Unspecified place or not applicable: Secondary | ICD-10-CM | POA: Insufficient documentation

## 2016-07-01 DIAGNOSIS — S0001XA Abrasion of scalp, initial encounter: Secondary | ICD-10-CM | POA: Insufficient documentation

## 2016-07-01 DIAGNOSIS — W19XXXA Unspecified fall, initial encounter: Secondary | ICD-10-CM

## 2016-07-01 DIAGNOSIS — Y9389 Activity, other specified: Secondary | ICD-10-CM | POA: Insufficient documentation

## 2016-07-01 MED ORDER — HYDROCODONE-ACETAMINOPHEN 5-325 MG PO TABS: 1.0000 | ORAL_TABLET | Freq: Four times a day (QID) | ORAL | 0 refills | Status: DC | PRN

## 2016-07-01 MED ORDER — NAPROXEN 500 MG PO TABS: 500.0000 mg | ORAL_TABLET | Freq: Two times a day (BID) | ORAL | 0 refills | Status: DC

## 2016-07-01 NOTE — ED Notes (Signed)
C-collar applied to patient for neck pain.

## 2016-07-01 NOTE — ED Notes (Signed)
Patient transported to CT 

## 2016-07-01 NOTE — ED Notes (Signed)
Applied [place] collar to pts neck

## 2016-07-01 NOTE — Discharge Instructions (Signed)
Your CT scans show a C6 spinous process with right lamina fracture.  Please follow up with Neurosurgery in the next couple of days for re-evaluation.  Wear your [place] collar at all times.  Return immediately if you experience any numbness, weakness, or increased pain.

## 2016-07-01 NOTE — ED Provider Notes (Signed)
[facility] DEPT Provider Note   CSN: [id number] Arrival date & time: 07/01/16  [id number]  First Provider Contact:  None       History   Chief Complaint Chief Complaint  Patient presents with  . Fall    HPI Kyle Mejia is a 60 y.o. male.  HPI Kyle Mejia is a 60 y.o. male with PMH significant for EtOH abuse, tobacca dependence, hep C   who presents with mechanical fall yesterday.  Patient was on a ladder reaching for something when the ladder became unstable causing him to fall backwards.  He reports LOC for a couple of seconds and then felt slightly dazed afterwards.  He is able recall the event and the events after.  He now complains of posterior neck pain that is constant, moderate, and worse with movement.  He has not tried anything for pain.  He also states he sustained a cut to the back of his head that he cleaned with soap and water.  Tetanus is up to date. He denies numbness, weakness, HA, visual disturbance, N/V, or back pain.  He is not on anticoagulants.   Past Medical History:  Diagnosis Date  . Chronic left shoulder pain   . Chronic pain of left knee   . History of alcoholism ([place])   . Tobacco dependence     Patient Active Problem List   Diagnosis Date Noted  . 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 Arthritis of left acromioclavicular joint 06/09/2016  . Osteoarthritis of left glenohumeral joint 06/09/2016  . Varus deformity, not elsewhere classified, left knee 06/02/2016  . Carpal tunnel syndrome 06/02/2016  . Hepatitis C 05/21/2016  . Tobacco dependence 05/19/2016  . Chronic left shoulder pain 05/19/2016  . History of alcohol abuse 05/19/2016  . Loss of weight 05/19/2016  . Elevated blood pressure 05/19/2016  . Neuropathy ([place]) 05/19/2016  . Depression 05/19/2016  . Colon cancer screening 05/19/2016  . Need for Tdap vaccination 05/19/2016  . Alcohol use disorder, severe, dependence ([place]) 03/19/2016    Past Surgical  History:  Procedure Laterality Date  . KNEE RECONSTRUCTION     left knee        Home Medications    Prior to Admission medications   Medication Sig Start Date End Date Taking? Authorizing Provider  DULoxetine (CYMBALTA) 20 MG capsule Take 1 capsule (20 mg total) by mouth at bedtime. For depression Patient not taking: Reported on 07/01/2016 05/19/16   Dorena Dew, FNP  folic acid (FOLVITE) 1 MG tablet Take 1 tablet (1 mg total) by mouth daily. Patient not taking: Reported on 07/01/2016 05/19/16   Dorena Dew, FNP  gabapentin (NEURONTIN) 300 MG capsule Take 1 capsule (300 mg total) by mouth 3 (three) times daily. For agitation Patient not taking: Reported on 07/01/2016 05/19/16   Dorena Dew, FNP  HYDROcodone-acetaminophen (NORCO/VICODIN) 5-325 MG tablet Take 1 tablet by mouth every 6 (six) hours as needed. 07/01/16   Gloriann Loan, PA-C  meloxicam (MOBIC) 7.5 MG tablet Take 1 tablet (7.5 mg total) by mouth daily. Patient not taking: Reported on [id number]   Elmo Putt B [place], DO  meloxicam (MOBIC) 7.5 MG tablet Take 1 tablet (7.5 mg total) by mouth daily. Patient not taking: Reported on [id number]   Elmo Putt B [place], DO  naproxen (NAPROSYN) 500 MG tablet Take 1 tablet (500 mg total) by mouth 2 (two) times daily. 07/01/16   Gloriann Loan, PA-C  thiamine (VITAMIN B-1) 100 MG  tablet Take 1 tablet (100 mg total) by mouth daily. Patient not taking: Reported on 07/01/2016 05/19/16   Dorena Dew, FNP    Family History Family History  Problem Relation Age of Onset  . Heart disease Mother     Social History Social History  Substance Use Topics  . Smoking status: Current Every Day Smoker    Packs/day: 0.50    Types: Cigarettes  . Smokeless tobacco: Never Used  . Alcohol use No     Comment: daily     Allergies   Review of patient's allergies indicates no known allergies.   Review of Systems Review of Systems All other systems negative unless otherwise  stated in HPI   Physical Exam Updated Vital Signs BP 149/81   Pulse 79   Temp 98 F (36.7 C) (Oral)   Resp 20   SpO2 98%   Physical Exam  Constitutional: He is oriented to person, place, and time. He appears well-developed and well-nourished.  Non-toxic appearance. He does not have a sickly appearance. He does not appear ill.  HENT:  Head: Normocephalic.  Mouth/Throat: Oropharynx is clear and moist.  Eyes: Conjunctivae are normal. Pupils are equal, round, and reactive to light.  Neck: Normal range of motion. Neck supple.  Minimal cervical midline or paracervical tenderness.  Decreased ROM in lateral flexion bilaterally.   Cardiovascular: Normal rate, regular rhythm and intact distal pulses.   Pulses:      Radial pulses are 2+ on the right side, and 2+ on the left side.  Pulmonary/Chest: Effort normal and breath sounds normal. No accessory muscle usage or stridor. No respiratory distress. He has no wheezes. He has no rhonchi. He has no rales.  Abdominal: Soft. Bowel sounds are normal. He exhibits no distension. There is no tenderness.  Musculoskeletal: Normal range of motion.  Lymphadenopathy:    He has no cervical adenopathy.  Neurological: He is alert and oriented to person, place, and time.  Mental Status:   AOx3.  Speech clear without dysarthria. Cranial Nerves:  I-not tested  II-PERRLA  III, IV, VI-EOMs intact  V-temporal and masseter strength intact  VII-symmetrical facial movements intact, no facial droop  VIII-hearing grossly intact bilaterally  IX, X-gag intact  XI-strength of sternomastoid and trapezius muscles 5/5  XII-tongue midline Motor:   Good muscle bulk and tone  Strength 5/5 bilaterally in upper and lower extremities   Cerebellar--intact RAMs, finger to nose intact bilaterally.  Gait normal  No pronator drift Sensory:  Intact in upper and lower extremities   Skin: Skin is warm and dry.  No bruising or signs of trauma other than superficial abrasion  to posterior parietal scalp without active bleeding.  Psychiatric: He has a normal mood and affect. His behavior is normal.     ED Treatments / Results  Labs (all labs ordered are listed, but only abnormal results are displayed) Labs Reviewed - No data to display  EKG  EKG Interpretation None       Radiology Ct Head Wo Contrast  Result Date: 07/01/2016 CLINICAL DATA:  Neck pain and loss of consciousness after falling from a ladder today. EXAM: CT HEAD WITHOUT CONTRAST CT CERVICAL SPINE WITHOUT CONTRAST TECHNIQUE: Multidetector CT imaging of the head and cervical spine was performed following the standard protocol without intravenous contrast. Multiplanar CT image reconstructions of the cervical spine were also generated. COMPARISON:  None. FINDINGS: CT HEAD FINDINGS No mass lesion. No midline shift. No acute hemorrhage or hematoma. No extra-axial fluid collections.  No evidence of acute infarction. Slight diffuse atrophy. Brain parenchyma is otherwise normal. Bones of the skull are normal. Periapical abscess around the left maxillary canine tooth. CT CERVICAL SPINE FINDINGS There is an acute fracture through the base of the spinous process of C6 extending into the right lamina. No other fractures. Severe degenerative disc disease at C5-6 and C6-7 with broad-based disc osteophyte complex at C5-6 which narrows the AP dimension of the spinal canal to 7 mm. No prevertebral soft tissue swelling. Moderate right facet arthritis at C4-5. IMPRESSION:: IMPRESSION: 1. Acute fracture of the spinous process and right lamina of C6. 2. No acute intracranial abnormality.  Slight atrophy. Electronically Signed   By: Lorriane Shire M.D.   On: 07/01/2016 10:55   Ct Cervical Spine Wo Contrast  Result Date: 07/01/2016 CLINICAL DATA:  Neck pain and loss of consciousness after falling from a ladder today. EXAM: CT HEAD WITHOUT CONTRAST CT CERVICAL SPINE WITHOUT CONTRAST TECHNIQUE: Multidetector CT imaging of the  head and cervical spine was performed following the standard protocol without intravenous contrast. Multiplanar CT image reconstructions of the cervical spine were also generated. COMPARISON:  None. FINDINGS: CT HEAD FINDINGS No mass lesion. No midline shift. No acute hemorrhage or hematoma. No extra-axial fluid collections. No evidence of acute infarction. Slight diffuse atrophy. Brain parenchyma is otherwise normal. Bones of the skull are normal. Periapical abscess around the left maxillary canine tooth. CT CERVICAL SPINE FINDINGS There is an acute fracture through the base of the spinous process of C6 extending into the right lamina. No other fractures. Severe degenerative disc disease at C5-6 and C6-7 with broad-based disc osteophyte complex at C5-6 which narrows the AP dimension of the spinal canal to 7 mm. No prevertebral soft tissue swelling. Moderate right facet arthritis at C4-5. IMPRESSION:: IMPRESSION: 1. Acute fracture of the spinous process and right lamina of C6. 2. No acute intracranial abnormality.  Slight atrophy. Electronically Signed   By: Lorriane Shire M.D.   On: 07/01/2016 10:55    Procedures Procedures (including critical care time)  Medications Ordered in ED Medications - No data to display   Initial Impression / Assessment and Plan / ED Course  I have reviewed the triage vital signs and the nursing notes.  Pertinent labs & imaging results that were available during my care of the patient were reviewed by me and considered in my medical decision making (see chart for details).  Clinical Course   Patient presents with neck pain after fall yesterday.  He has no focal neurological deficits.  VSS.  Superficial abrasion to posterior parietal scalp.  Tetanus is up to date.  Mild cervical midline tenderness with decreased ROM.  Patient placed in c-collar.  CT head and neck obtained to evaluate for fracture.  This was remarkable for acute fx of spinous process and right lamina of C6,  no acute intracranial abnormality.   This is a stable fracture.  He has NO neurological deficits.  Placed in [place] collar.  Home with pain control.  Follow up neurosurgery.  Strict return precautions discussed including worsening pain, numbness, or weakness.    Case has been discussed with Dr. Laneta Simmers who agrees with the above plan for discharge.   Final Clinical Impressions(s) / ED Diagnoses   Final diagnoses:  Fall, initial encounter  Closed fracture of spinous process of cervical vertebra, initial encounter (HCC)    New Prescriptions New Prescriptions   HYDROCODONE-ACETAMINOPHEN (NORCO/VICODIN) 5-325 MG TABLET    Take 1 tablet by mouth  every 6 (six) hours as needed.   NAPROXEN (NAPROSYN) 500 MG TABLET    Take 1 tablet (500 mg total) by mouth 2 (two) times daily.      Gloriann Loan, PA-C 07/01/16 [id number]    Leo Grosser, MD 07/01/16 [phone]

## 2016-07-01 NOTE — ED Triage Notes (Signed)
Patient here with complaint of posterior neck pain x 1 day after falling off ladder approximately 5 feet, positive loc. Complains of pain only with movement, no neuro deficits. Alert and oriented

## 2016-07-14 ENCOUNTER — Other Ambulatory Visit: Payer: Self-pay

## 2016-07-14 DIAGNOSIS — B182 Chronic viral hepatitis C: Secondary | ICD-10-CM

## 2016-07-14 LAB — HEPATITIS B CORE ANTIBODY, TOTAL: Hep B Core Total Ab: REACTIVE — AB

## 2016-07-14 LAB — HEPATITIS A ANTIBODY, TOTAL: Hep A Total Ab: NONREACTIVE

## 2016-07-15 LAB — PROTIME-INR
INR: 1.2 — AB
Prothrombin Time: 12.2 s — ABNORMAL HIGH (ref 9.0–11.5)

## 2016-07-15 LAB — AFP TUMOR MARKER: AFP-Tumor Marker: 44.4 ng/mL — ABNORMAL HIGH (ref <6.1)

## 2016-07-16 ENCOUNTER — Ambulatory Visit (INDEPENDENT_AMBULATORY_CARE_PROVIDER_SITE_OTHER): Payer: Self-pay | Admitting: Family Medicine

## 2016-07-16 ENCOUNTER — Encounter: Payer: Self-pay | Admitting: Family Medicine

## 2016-07-16 VITALS — BP 131/78 | HR 97 | Temp 98.2°F | Resp 18 | Ht 70.0 in | Wt 182.0 lb

## 2016-07-16 DIAGNOSIS — F329 Major depressive disorder, single episode, unspecified: Secondary | ICD-10-CM

## 2016-07-16 DIAGNOSIS — M25512 Pain in left shoulder: Secondary | ICD-10-CM

## 2016-07-16 DIAGNOSIS — F1010 Alcohol abuse, uncomplicated: Secondary | ICD-10-CM

## 2016-07-16 DIAGNOSIS — M25562 Pain in left knee: Secondary | ICD-10-CM

## 2016-07-16 DIAGNOSIS — F1011 Alcohol abuse, in remission: Secondary | ICD-10-CM

## 2016-07-16 DIAGNOSIS — G8929 Other chronic pain: Secondary | ICD-10-CM

## 2016-07-16 DIAGNOSIS — F32A Depression, unspecified: Secondary | ICD-10-CM

## 2016-07-16 DIAGNOSIS — G629 Polyneuropathy, unspecified: Secondary | ICD-10-CM

## 2016-07-16 NOTE — Progress Notes (Signed)
Patient is here for FU  Patient complains of neck pain being present and not being able to sleep with the neck brace in place.  Patient states he was unable to pick up his medication due to funds not being available.

## 2016-07-16 NOTE — Patient Instructions (Signed)
Congratulations on stopping drinking Conitnue to work on smoking cessation. Follow-up with orthopedist Will let you know about colonoscopy.

## 2016-07-17 LAB — HCV RNA,LIPA RFLX NS5A DRUG RESIST

## 2016-07-19 LAB — HCV RNA NS5A DRUG RESISTANCE

## 2016-07-20 NOTE — Progress Notes (Signed)
Kyle Mejia, is a 60 y.o. male  [id number]  [id number]  DOB - [birth year]  CC:  Chief Complaint  Patient presents with  . Follow-up       HPI: Kyle Mejia is a 60 y.o. male here for follow-up neuropathy and depression. He was seen by Mrs. Smith Robert about about a month ago  Prior to that he had been in alcohol rehab and was started on cymbalta for depression. Patient has a diagnosis of chronic pain of left shoulder and right knee, He has a history of alcohol abuse and tobacco dependence. He reports there has been some decrease in neuropathy and depression.He will continue his gabapentin and cymbalta.   He reports he has had no alcohol in 60 days. He is smoking 1 pack of cigarettes every two days. He reports taking his folic acid and thiamine as prescribed.   No Known Allergies Past Medical History:  Diagnosis Date  . Chronic left shoulder pain   . Chronic pain of left knee   . History of alcoholism ([place])   . Tobacco dependence    Current Outpatient Prescriptions on File Prior to Visit  Medication Sig Dispense Refill  . DULoxetine (CYMBALTA) 20 MG capsule Take 1 capsule (20 mg total) by mouth at bedtime. For depression 30 capsule 1  . folic acid (FOLVITE) 1 MG tablet Take 1 tablet (1 mg total) by mouth daily. 30 tablet 11  . gabapentin (NEURONTIN) 300 MG capsule Take 1 capsule (300 mg total) by mouth 3 (three) times daily. For agitation 90 capsule 1  . meloxicam (MOBIC) 7.5 MG tablet Take 1 tablet (7.5 mg total) by mouth daily. 30 tablet 1  . naproxen (NAPROSYN) 500 MG tablet Take 1 tablet (500 mg total) by mouth 2 (two) times daily. 30 tablet 0  . thiamine (VITAMIN B-1) 100 MG tablet Take 1 tablet (100 mg total) by mouth daily. 30 tablet 11  . HYDROcodone-acetaminophen (NORCO/VICODIN) 5-325 MG tablet Take 1 tablet by mouth every 6 (six) hours as needed. (Patient not taking: Reported on 07/16/2016) 12 tablet 0   No current facility-administered medications on file  prior to visit.    Family History  Problem Relation Age of Onset  . Heart disease Mother    Social History   Social History  . Marital status: Single    Spouse name: N/A  . Number of children: N/A  . Years of education: N/A   Occupational History  . Not on file.   Social History Main Topics  . Smoking status: Current Every Day Smoker    Packs/day: 0.50    Types: Cigarettes  . Smokeless tobacco: Never Used  . Alcohol use No     Comment: daily  . Drug use: No  . Sexual activity: Not on file   Other Topics Concern  . Not on file   Social History Narrative  . No narrative on file    Review of Systems: Constitutional: Negative for fever, chills, appetite change, weight loss,  Fatigue. Skin: Negative for rashes or lesions of concern. HENT: Negative for ear pain, ear discharge.nose bleeds Eyes: Negative for pain, discharge, redness, itching and visual disturbance. Neck: Negative for pain, stiffness Respiratory: Negative for cough, shortness of breath,   Cardiovascular: Negative for chest pain, palpitations and leg swelling. Gastrointestinal: Negative for abdominal pain, nausea, vomiting, diarrhea, constipations Genitourinary: Negative for dysuria, urgency, frequency, hematuria,  Musculoskeletal: Negative for back pain, joint pain, joint  swelling, and gait problem.Negative for weakness.Positive for neck and shoulder  pain Neurological: Negative for dizziness, tremors, seizures, syncope,   light-headedness, numbness and headaches.  Hematological: Negative for easy bruising or bleeding Psychiatric/Behavioral: Positive  for depression, anxiety   Objective:   Vitals:   07/16/16 1524  BP: 131/78  Pulse: 97  Resp: 18  Temp: 98.2 F (36.8 C)    Physical Exam: Constitutional: Patient appears well-developed and well-nourished. No distress. HENT: Normocephalic, atraumatic, External right and left ear normal. Oropharynx is clear and moist.  Eyes: Conjunctivae and EOM are  normal. PERRLA, no scleral icterus. Neck: Normal ROM. Neck supple. No lymphadenopathy, No thyromegaly. CVS: RRR, S1/S2 +, no murmurs, no gallops, no rubs Pulmonary: Effort and breath sounds normal, no stridor, rhonchi, wheezes, rales.  Abdominal: Soft. Normoactive BS,, no distension, tenderness, rebound or guarding.  Musculoskeletal: Normal range of motion. No edema and no tenderness.  Neuro: Alert.Normal muscle tone coordination. Non-focal Skin: Skin is warm and dry. No rash noted. Not diaphoretic. No erythema. No pallor. Psychiatric: Normal mood and affect. Behavior, judgment, thought content normal.  Lab Results  Component Value Date   WBC 6.5 05/19/2016   HGB 14.6 05/19/2016   HCT 42.6 05/19/2016   MCV 96.4 05/19/2016   PLT 109 (L) 05/19/2016   Lab Results  Component Value Date   CREATININE 0.61 (L) 05/19/2016   BUN 17 05/19/2016   NA 141 05/19/2016   K 3.9 05/19/2016   CL 107 05/19/2016   CO2 21 05/19/2016    Lab Results  Component Value Date   HGBA1C 5.0 05/19/2016   Lipid Panel     Component Value Date/Time   CHOL 133 05/19/2016 0951   TRIG 73 05/19/2016 0951   HDL 39 (L) 05/19/2016 0951   CHOLHDL 3.4 05/19/2016 0951   VLDL 15 05/19/2016 0951   LDLCALC 79 05/19/2016 0951       Assessment and plan:   1 Depresion -continue Cymbalta  2. Neuropathy - Continue gabapentin  3.Tobacco Use -Continue to try to stop  3. Hx alcoholism - continue not to drink   Return in about 3 months (around 10/16/2016).  The patient was given clear instructions to go to ER or return to medical center if symptoms don't improve, worsen or new problems develop. The patient verbalized understanding.    Micheline Chapman FNP  07/20/2016, 12:44 PM

## 2016-07-29 ENCOUNTER — Encounter: Payer: Self-pay | Admitting: Vascular Surgery

## 2016-08-04 ENCOUNTER — Ambulatory Visit (INDEPENDENT_AMBULATORY_CARE_PROVIDER_SITE_OTHER): Payer: Self-pay | Admitting: Internal Medicine

## 2016-08-04 ENCOUNTER — Ambulatory Visit (HOSPITAL_COMMUNITY)
Admission: RE | Admit: 2016-08-04 | Discharge: 2016-08-04 | Disposition: A | Discharge status: Home / Self Care | Payer: Self-pay | Source: Ambulatory Visit | Attending: Vascular Surgery | Admitting: Vascular Surgery

## 2016-08-04 ENCOUNTER — Ambulatory Visit (INDEPENDENT_AMBULATORY_CARE_PROVIDER_SITE_OTHER): Payer: Self-pay | Admitting: Vascular Surgery

## 2016-08-04 ENCOUNTER — Encounter: Payer: Self-pay | Admitting: Internal Medicine

## 2016-08-04 ENCOUNTER — Encounter: Payer: Self-pay | Admitting: Vascular Surgery

## 2016-08-04 VITALS — BP 123/75 | HR 95 | Temp 97.3°F | Resp 16 | Ht 70.0 in | Wt 190.0 lb

## 2016-08-04 VITALS — BP 136/89 | HR 120 | Temp 98.1°F | Ht 70.5 in | Wt 188.0 lb

## 2016-08-04 DIAGNOSIS — R0989 Other specified symptoms and signs involving the circulatory and respiratory systems: Secondary | ICD-10-CM | POA: Insufficient documentation

## 2016-08-04 DIAGNOSIS — B182 Chronic viral hepatitis C: Secondary | ICD-10-CM

## 2016-08-04 DIAGNOSIS — M1712 Unilateral primary osteoarthritis, left knee: Secondary | ICD-10-CM

## 2016-08-04 DIAGNOSIS — M179 Osteoarthritis of knee, unspecified: Secondary | ICD-10-CM

## 2016-08-04 MED ORDER — ELBASVIR-GRAZOPREVIR 50-100 MG PO TABS: 1.0000 | ORAL_TABLET | Freq: Every day | ORAL | 2 refills | Status: DC

## 2016-08-04 NOTE — Patient Instructions (Signed)
Date 08/04/16  Dear Mr. Piontek, As discussed in the [facility], your hepatitis C therapy will include the following medications:          Zepatier (elbasvir 50 mg/grazoprevir 100 mg) for 12 weeks   Please note that ALL MEDICATIONS WILL START ON THE SAME DATE for a total of 12 weeks. ---------------------------------------------------------------- Your HCV Treatment Start Date: TBA   Your HCV genotype:  1a    Liver Fibrosis: TBD    ---------------------------------------------------------------- YOUR PHARMACY CONTACT:   [place] [facility] [place] Phone: [phone] Hours: Monday to Friday 7:30 am to 6:00 pm   Please always contact your pharmacy at least 3-4 business days before you run out of medications to ensure your next month's medication is ready or 1 week prior to running out if you receive it by mail.  Remember, each prescription is for 28 days. ---------------------------------------------------------------- GENERAL NOTES REGARDING YOUR HEPATITIS C MEDICATION:  ZEPATIER is available as a beige-colored, oval-shaped, film-coated tablet debossed with "770" on one side and plain on the other. Each tablet contains 50 mg elbasvir and 100 mg grazoprevir.   Common side effects of ZEPATIER when used without ribavirin include: - feeling tired -trouble sleeping - headache -diarrhea - nausea  Common side effects of ZEPATIER when used with ribavirin include: - low red blood cell counts (anemia) - feeling irritable - headache - stomach pain - feeling tired - depression - shortness of breath - joint pain - rash or itching   Please note that this only lists the most common side effects and is NOT a comprehensive list of the potential side effects of these medications. For more information, please review the drug information sheets that come with your medication package from the pharmacy.   ---------------------------------------------------------------- GENERAL HELPFUL HINTS ON HCV THERAPY: 1. Stay well-hydrated. 2. Notify the [facility] of any changes in your other over-the-counter/herbal or prescription medications. 3. If you miss a dose of your medication, take the missed dose as soon as you remember. Return to your regular time/dose schedule the next day.  4.  Do not stop taking your medications without first talking with your healthcare provider. 5.  You may take Tylenol (acetaminophen), as long as the dose is less than 2000 mg (OR no more than 4 tablets of the Tylenol Extra Strengths 500mg  tablet) in 24 hours. 6.  You will see our pharmacist-specialist within the first 2 weeks of starting your medication. 7.  You will need to obtain routine labs around week 4 and12 weeks after starting and then 3 to 6 months after finishing Zepatier.   8.  If ribavirin is part of your regimen, you also will have a lab visit within 2 weeks.   Scharlene Gloss, [place] [facility] [place] [address] [place], [place]  [phone]

## 2016-08-04 NOTE — Progress Notes (Signed)
[facility] [place]  Patient name: Kyle Mejia MRN: [id number] DOB: 1955/11/26 Sex: male  REASON FOR VISIT: preoperative evaluation for left knee replacement  HPI: Kyle Mejia is a 60 y.o. male, who presents for vascular evaluation prior to left knee replacement for osteoarthritis. He previously dislocated his left knee in 1981 water skiing. He was in his mid 3s at that time. The patient denied any issues with circulation following this. He did have a left foot drop after his injury. He does describe intermittent numbness to the dorsum of his left foot that has been present since his original injury. He denies any claudication symptoms. The patient has been complaining of left knee pain for years. 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   His atherosclerotic risk factors include smoking half a pack every 2 days. He is trying to quit. He does not have hypertension, hyperlipidemia or diabetes. He has never had chest pain or shortness of breath. He does not have any premature cardiac history in his family. He has no history of aneurysm.  Past Medical History:  Diagnosis Date  . Chronic left shoulder pain   . Chronic pain of left knee   . History of alcoholism ([place])   . Tobacco dependence     Family History  Problem Relation Age of Onset  . Heart disease Mother     SOCIAL HISTORY: Social History   Social History  . Marital status: Single    Spouse name: N/A  . Number of children: N/A  . Years of education: N/A   Occupational History  . Not on file.   Social History Main Topics  . Smoking status: Current Every Day Smoker    Packs/day: 0.50    Types: Cigarettes  . Smokeless tobacco: Never Used     Comment: 1/2 pk per day.   . Alcohol use No     Comment: daily  . Drug use: No  . Sexual activity: Not on file   Other Topics Concern  . Not on file   Social History Narrative  .  No narrative on file    No Known Allergies  Current Outpatient Prescriptions  Medication Sig Dispense Refill  . DULoxetine (CYMBALTA) 20 MG capsule Take 1 capsule (20 mg total) by mouth at bedtime. For depression 30 capsule 1  . folic acid (FOLVITE) 1 MG tablet Take 1 tablet (1 mg total) by mouth daily. 30 tablet 11  . gabapentin (NEURONTIN) 300 MG capsule Take 1 capsule (300 mg total) by mouth 3 (three) times daily. For agitation 90 capsule 1  . HYDROcodone-acetaminophen (NORCO/VICODIN) 5-325 MG tablet Take 1 tablet by mouth every 6 (six) hours as needed. 12 tablet 0  . meloxicam (MOBIC) 7.5 MG tablet Take 1 tablet (7.5 mg total) by mouth daily. 30 tablet 1  . naproxen (NAPROSYN) 500 MG tablet Take 1 tablet (500 mg total) by mouth 2 (two) times daily. 30 tablet 0  . thiamine (VITAMIN B-1) 100 MG tablet Take 1 tablet (100 mg total) by mouth daily. 30 tablet 11   No current facility-administered medications for this visit.     REVIEW OF SYSTEMS:  [X]  denotes positive finding, [ ]  denotes negative finding Cardiac  Comments:  Chest pain or chest pressure:    Shortness of breath upon exertion:    Short of breath when lying flat:    Irregular heart rhythm:  Vascular    Pain in calf, thigh, or hip brought on by ambulation:    Pain in feet at night that wakes you up from your sleep:     Blood clot in your veins:    Leg swelling:         Pulmonary    Oxygen at home:    Productive cough:     Wheezing:         Neurologic    Sudden weakness in arms or legs:     Sudden numbness in arms or legs:     Sudden onset of difficulty speaking or slurred speech:    Temporary loss of vision in one eye:     Problems with dizziness:         Gastrointestinal    Blood in stool:     Vomited blood:         Genitourinary    Burning when urinating:     Blood in urine:        Psychiatric    Major depression:         Hematologic    Bleeding problems:    Problems with blood clotting too  easily:        Skin    Rashes or ulcers:        Constitutional    Fever or chills:      PHYSICAL EXAM: Vitals:   08/04/16 1257  BP: 123/75  Pulse: 95  Resp: 16  Temp: 97.3 F (36.3 C)  TempSrc: Oral  SpO2: 95%  Weight: 190 lb (86.2 kg)  Height: 5\' 10"  (1.778 m)    GENERAL: The patient is a well-nourished male, in no acute distress. The vital signs are documented above. CARDIAC: There is a regular rate and rhythm. No carotid bruits. VASCULAR: 2+ radial pulses equal and symmetric. 2+ femoral pulses bilaterally. 2+ right popliteal pulse. Nonpalpable left popliteal pulse. Palpable 2+ PT pulses bilaterally. 2+ right DP. Nonpalpable left DP pulse. PULMONARY: There is good air exchange bilaterally without wheezing or rales. ABDOMEN: Soft and non-tender with normal pitched bowel sounds. No pulsatile mass. MUSCULOSKELETAL: Varus deformity left knee.  NEUROLOGIC: No focal weakness or paresthesias are detected. SKIN: There are no ulcers or rashes noted. PSYCHIATRIC: The patient has a normal affect.  DATA:  ABIs 08/04/2016  Right: 1.06 with triphasic waveforms Left: 1.06 with triphasic waveforms  MEDICAL ISSUES: Osteoarthritis left knee  The patient is being considered for left total knee replacement. He sustained a dislocation of his left knee in 1981. There is concern for possible popliteal injury at that time. His only major atherosclerotic risk factor is smoking. He is trying to quit. His ABIs today are normal with normal triphasic waveforms. He has a strong palpable left posterior tibial pulse. Doubt any popliteal artery issues. He may proceed  with left total knee surgery from a vascular standpoint. He will follow up prn.   Virgina Jock, PA-C [facility] [place]  I have interviewed the patient and examined the patient. I agree with the findings by the PA. Given that he has a strong posterior tibial pulse on the left and normal triphasic  signals and normal ABIs in the left I do not think any further workup is needed to evaluate his left popliteal artery. The injury occurred when he was 60 years old and at that age it is amazing what the arteries can tolerate. Given that he has had no problems from a vascular standpoint since  that time I think the likelihood of an injury related to this accident while water skiing is very unlikely. Therefore I would not recommend any further workup. I think it is safe to proceed with knee replacement on the left as planned.  Gae Gallop, MD [phone]

## 2016-08-04 NOTE — Progress Notes (Signed)
[facility]   CC: consideration for treatment for chronic hepatitis C  HPI:  +Kyle Mejia is a 60 y.o. male who presents for initial evaluation and management of chronic hepatitis C.  Patient tested positive earlier this year during screening. 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 Patient denies intranasal drug use, multiple sexual partners, renal dialysis, sexual contact with person with liver disease, tattoos. Patient has had other studies performed. Results: hepatitis C RNA by PCR, result: positive. Patient has not had prior treatment for Hepatitis C. Patient does not have a past history of liver disease. Patient does not have a family history of liver disease. Patient does not  have associated signs or symptoms related to liver disease.  Labs reviewed and confirm chronic hepatitis C with a positive viral load.   Records reviewed from PCP and has been alcohol free for over 2 months     Patient does not have documented immunity to Hepatitis A. Patient does not have documented immunity to Hepatitis B.    Review of Systems:  Constitutional: negative for fatigue and malaise Gastrointestinal: negative for diarrhea Musculoskeletal: negative for myalgias and arthralgias All other systems reviewed and are negative      Past Medical History:  Diagnosis Date  . Chronic left shoulder pain   . Chronic pain of left knee   . History of alcoholism ([place])   . Tobacco dependence     Prior to Admission medications   Medication Sig Start Date End Date Taking? Authorizing Provider  DULoxetine (CYMBALTA) 20 MG capsule Take 1 capsule (20 mg total) by mouth at bedtime. For depression 05/19/16  Yes Dorena Dew, FNP  folic acid (FOLVITE) 1 MG tablet Take 1 tablet (1 mg total) by mouth daily. 05/19/16  Yes Dorena Dew, FNP  gabapentin (NEURONTIN) 300 MG capsule Take 1 capsule (300 mg total) by mouth 3  (three) times daily. For agitation 05/19/16  Yes Dorena Dew, FNP  meloxicam (MOBIC) 7.5 MG tablet Take 1 tablet (7.5 mg total) by mouth daily. [id number]  Yes Alicia B Chitanand, DO  naproxen (NAPROSYN) 500 MG tablet Take 1 tablet (500 mg total) by mouth 2 (two) times daily. 07/01/16  Yes Kayla Rose, PA-C  thiamine (VITAMIN B-1) 100 MG tablet Take 1 tablet (100 mg total) by mouth daily. 05/19/16  Yes Dorena Dew, FNP  Elbasvir-Grazoprevir (ZEPATIER) 50-100 MG TABS Take 1 tablet by mouth daily. 08/04/16   Thayer Headings, MD    No Known Allergies  Social History  Substance Use Topics  . Smoking status: Current Every Day Smoker    Packs/day: 0.50    Types: Cigarettes  . Smokeless tobacco: Never Used     Comment: 1/2 pk per day.   . Alcohol use No     Comment: daily    Family History  Problem Relation Age of Onset  . Heart disease Mother       Objective:  Constitutional: in no apparent distress and alert,  Vitals:   08/04/16 1500  BP: 136/89  Pulse: (!) 120  Temp: 98.1 F (36.7 C)   Eyes: anicteric Cardiovascular: Cor RRR Respiratory: CTA B; normal respiratory effort Gastrointestinal: Bowel sounds are normal, liver is not enlarged, spleen is not enlarged Musculoskeletal: no pedal edema noted Skin: negatives: no rash; no porphyria cutanea tarda Lymphatic: no cervical lymphadenopathy   Laboratory Genotype: No results found for: HCVGENOTYPE HCV viral load:  Lab Results  Component Value Date   HCVQUANT 423,512 (H) 05/19/2016   Lab Results  Component Value Date   WBC 6.5 05/19/2016   HGB 14.6 05/19/2016   HCT 42.6 05/19/2016   MCV 96.4 05/19/2016   PLT 109 (L) 05/19/2016    Lab Results  Component Value Date   CREATININE 0.61 (L) 05/19/2016   BUN 17 05/19/2016   NA 141 05/19/2016   K 3.9 05/19/2016   CL 107 05/19/2016   CO2 21 05/19/2016    Lab Results  Component Value Date   ALT 44 05/19/2016   AST 47 (H) 05/19/2016   ALKPHOS 133 (H) 05/19/2016      Labs and history reviewed and show CHILD-PUGH A (score of 6 though no ultrasound to confirm no ascites)  5-6 points: Child class A 7-9 points: Child class B 10-15 points: Child class C  Lab Results  Component Value Date   INR 1.2 (H) 07/14/2016   BILITOT 1.2 05/19/2016   ALBUMIN 3.3 (L) 05/19/2016     Assessment: New Patient with Chronic Hepatitis C genotype 1a, untreated.  I discussed with the patient the lab findings that confirm chronic hepatitis C as well as the natural history and progression of disease including about 30% of people who develop cirrhosis of the liver if left untreated and once cirrhosis is established there is a 2-7% risk per year of liver cancer and liver failure.  I discussed the importance of treatment and benefits in reducing the risk, even if significant liver fibrosis exists.   Plan: 1) Patient counseled extensively on limiting acetaminophen to no more than 2 grams daily, avoidance of alcohol. 2) Transmission discussed with patient including sexual transmission, sharing razors and toothbrush.   3) Will need referral to gastroenterology if concern for cirrhosis 4) Will need referral for substance abuse counseling: No.; Further work up to include urine drug screen  No. 5) Will prescribe Zepatier for 12 weeks or 16 weeks with ribavirin if any NS5A resistance found 6) Hepatitis A vaccine Yes.   7) Hepatitis B vaccine Yes.   8) Pneumovax vaccine if concern for cirrhosis 9) Further work up to include liver staging with elastography 10) NS5A test  Yes.  negative 10) will follow up after starting medication

## 2016-08-07 ENCOUNTER — Emergency Department (HOSPITAL_COMMUNITY)
Admission: EM | Admit: 2016-08-07 | Discharge: 2016-08-08 | Disposition: A | Discharge status: Home / Self Care | Payer: Self-pay | Attending: Emergency Medicine | Admitting: Emergency Medicine

## 2016-08-07 ENCOUNTER — Ambulatory Visit (HOSPITAL_COMMUNITY)
Admission: AD | Admit: 2016-08-07 | Discharge: 2016-08-07 | Disposition: A | Discharge status: Home / Self Care | Payer: Self-pay | Attending: Psychiatry | Admitting: Psychiatry

## 2016-08-07 ENCOUNTER — Encounter (HOSPITAL_COMMUNITY): Payer: Self-pay | Admitting: *Deleted

## 2016-08-07 DIAGNOSIS — F1910 Other psychoactive substance abuse, uncomplicated: Secondary | ICD-10-CM | POA: Insufficient documentation

## 2016-08-07 DIAGNOSIS — F17210 Nicotine dependence, cigarettes, uncomplicated: Secondary | ICD-10-CM | POA: Insufficient documentation

## 2016-08-07 DIAGNOSIS — G8929 Other chronic pain: Secondary | ICD-10-CM | POA: Insufficient documentation

## 2016-08-07 DIAGNOSIS — R45851 Suicidal ideations: Secondary | ICD-10-CM

## 2016-08-07 DIAGNOSIS — F1020 Alcohol dependence, uncomplicated: Secondary | ICD-10-CM | POA: Diagnosis present

## 2016-08-07 DIAGNOSIS — F1010 Alcohol abuse, uncomplicated: Secondary | ICD-10-CM | POA: Insufficient documentation

## 2016-08-07 DIAGNOSIS — M25562 Pain in left knee: Secondary | ICD-10-CM | POA: Insufficient documentation

## 2016-08-07 DIAGNOSIS — F339 Major depressive disorder, recurrent, unspecified: Secondary | ICD-10-CM

## 2016-08-07 DIAGNOSIS — Z1211 Encounter for screening for malignant neoplasm of colon: Secondary | ICD-10-CM | POA: Insufficient documentation

## 2016-08-07 DIAGNOSIS — F329 Major depressive disorder, single episode, unspecified: Secondary | ICD-10-CM | POA: Insufficient documentation

## 2016-08-07 DIAGNOSIS — F1024 Alcohol dependence with alcohol-induced mood disorder: Secondary | ICD-10-CM | POA: Insufficient documentation

## 2016-08-07 LAB — COMPREHENSIVE METABOLIC PANEL
ALK PHOS: 130 U/L — AB (ref 38–126)
ALT: 42 U/L (ref 17–63)
ANION GAP: 8 (ref 5–15)
AST: 53 U/L — ABNORMAL HIGH (ref 15–41)
Albumin: 3.5 g/dL (ref 3.5–5.0)
BILIRUBIN TOTAL: 1 mg/dL (ref 0.3–1.2)
BUN: 10 mg/dL (ref 6–20)
CALCIUM: 8.7 mg/dL — AB (ref 8.9–10.3)
CO2: 22 mmol/L (ref 22–32)
CREATININE: 0.67 mg/dL (ref 0.61–1.24)
Chloride: 109 mmol/L (ref 101–111)
Glucose, Bld: 97 mg/dL (ref 65–99)
Potassium: 3.7 mmol/L (ref 3.5–5.1)
Sodium: 139 mmol/L (ref 135–145)
TOTAL PROTEIN: 7.5 g/dL (ref 6.5–8.1)

## 2016-08-07 LAB — RAPID URINE DRUG SCREEN, HOSP PERFORMED
AMPHETAMINES: NOT DETECTED
BARBITURATES: NOT DETECTED
BENZODIAZEPINES: POSITIVE — AB
Cocaine: NOT DETECTED
Opiates: NOT DETECTED
TETRAHYDROCANNABINOL: NOT DETECTED

## 2016-08-07 LAB — CBC
HEMATOCRIT: 40.9 % (ref 39.0–52.0)
HEMOGLOBIN: 14.5 g/dL (ref 13.0–17.0)
MCH: 33.1 pg (ref 26.0–34.0)
MCHC: 35.5 g/dL (ref 30.0–36.0)
MCV: 93.4 fL (ref 78.0–100.0)
Platelets: 100 10*3/uL — ABNORMAL LOW (ref 150–400)
RBC: 4.38 MIL/uL (ref 4.22–5.81)
RDW: 12.8 % (ref 11.5–15.5)
WBC: 8.4 10*3/uL (ref 4.0–10.5)

## 2016-08-07 LAB — ACETAMINOPHEN LEVEL

## 2016-08-07 LAB — SALICYLATE LEVEL

## 2016-08-07 LAB — ETHANOL: Alcohol, Ethyl (B): 91 mg/dL — ABNORMAL HIGH (ref <5)

## 2016-08-07 NOTE — BH Assessment (Addendum)
Tele Assessment Note   Kyle Mejia is a divorced 60 y.o. male who presents unaccompanied to [facility] reporting alcohol use and depressive symptoms including suicidal ideation. Pt has a history of alcohol use and says he relapsed approximately two months ago after ninety days of sobriety. He says he would rather be dead than continue to live his current life. He says he has considered cutting his wrist or jumping from a bridge and says "if I don't get help it is over for me." He reports one previous suicide attempt years ago by overdose. Pt reports symptoms including crying spells, social withdrawal, loss of interest in usual pleasures, fatigue, decreased concentration, decreased sleep and feelings of guilt and hopelessness. He denies homicidal ideation or history of violence. He denies any history of psychotic symptoms.  Pt reports he drinks an average of five shots of liquor and five beers daily. He denies current withdrawal symptoms. He denies other substance use.  Pt states he is currently unemployed and homeless. He says he has a girlfriend who has chronic medical problems "and she is getting sick of me." He reports having left knee pain and his doctor told him he needs a knee replacement. He has no children. He denies current legal problems. Pt was admitted to [facility] in April 2017 followed by two weeks at [facility].  Pt is dressed in dirty pants, a t-shirt and cap. He is alert, oriented x4 with normal speech and normal motor behavior. Eye contact is good and Pt is tearful. Pt's mood is depressed and helpless; affect is congruent with mood. Thought process is coherent and relevant. There is no indication Pt is currently responding to internal stimuli or experiencing delusional thought content. Pt was cooperative throughout assessment and is requesting inpatient treatment.    Diagnosis: Alcohol-induced depressive disorder, with severe use disorder; Alcohol Use Disorder, Severe  Past Medical  History:  Past Medical History:  Diagnosis Date  . Chronic left shoulder pain   . Chronic pain of left knee   . History of alcoholism ([place])   . Tobacco dependence     Past Surgical History:  Procedure Laterality Date  . KNEE RECONSTRUCTION     left knee     Family History:  Family History  Problem Relation Age of Onset  . Heart disease Mother     Social History:  reports that he has been smoking Cigarettes.  He has been smoking about 0.50 packs per day. He has never used smokeless tobacco. He reports that he does not drink alcohol or use drugs.  Additional Social History:  Alcohol / Drug Use Pain Medications: Denies abuse Prescriptions: Denies abuse Over the Counter: Denies abuse History of alcohol / drug use?: Yes Longest period of sobriety (when/how long): @6  months sober over 20 years ago Negative Consequences of Use: Financial, Personal relationships, Work / School Withdrawal Symptoms:  (Pt denies) Substance #1 Name of Substance 1: Alcohol 1 - Age of First Use: 14 1 - Amount (size/oz): Average five shots of liquor and five beers 1 - Frequency: Daily 1 - Duration: Ongoing for years 1 - Last Use / Amount: 08/07/16, Four small bottle of liquor and four cans of beer  CIWA:   COWS:    PATIENT STRENGTHS: (choose at least two) Ability for insight Average or above average intelligence Capable of independent living Communication skills General fund of knowledge Motivation for treatment/growth Work skills  Allergies: No Known Allergies  Home Medications:  (Not in a hospital admission)  OB/GYN Status:  No LMP for male patient.  General Assessment Data Location of Assessment: [facility] Assessment Services TTS Assessment: In system Is this a Tele or Face-to-Face Assessment?: Face-to-Face Is this an Initial Assessment or a Re-assessment for this encounter?: Initial Assessment Marital status: Divorced [place] name: NA Is patient pregnant?: No Pregnancy Status:  No Living Arrangements: Other (Comment) (Homeless) Can pt return to current living arrangement?: Yes Admission Status: Voluntary Is patient capable of signing voluntary admission?: Yes Referral Source: Self/Family/Friend Insurance type: Self-pay  Medical Screening Exam ([place]) Medical Exam completed: Yes  Crisis Care Plan Living Arrangements: Other (Comment) (Homeless) Legal Guardian: Other: (Self) Name of Psychiatrist: None Name of Therapist: None  Education Status Is patient currently in school?: No Current Grade: NA Highest grade of school patient has completed: 12 Name of school: NA Contact person: NA  Risk to self with the past 6 months Suicidal Ideation: Yes-Currently Present Has patient been a risk to self within the past 6 months prior to admission? : Yes Suicidal Intent: No Has patient had any suicidal intent within the past 6 months prior to admission? : No Is patient at risk for suicide?: Yes Suicidal Plan?: Yes-Currently Present Has patient had any suicidal plan within the past 6 months prior to admission? : Yes Specify Current Suicidal Plan: Has considered cutting wrist or jumping from bridge Access to Means: Yes Specify Access to Suicidal Means: Access to knives and overpasses What has been your use of drugs/alcohol within the last 12 months?: Pt drinking alcohol daily Previous Attempts/Gestures: Yes How many times?: 1 (Reports one previous suicide attempt by overdose) Other Self Harm Risks: None Triggers for Past Attempts: Other (Comment) (Alcohol use) Intentional Self Injurious Behavior: None Family Suicide History: No Recent stressful life event(s): Job Loss, Museum/gallery curator Problems, Other (Comment) (Homeless) Persecutory voices/beliefs?: No Depression: Yes Depression Symptoms: Despondent, Tearfulness, Isolating, Fatigue, Guilt, Loss of interest in usual pleasures, Feeling worthless/self pity Substance abuse history and/or treatment for substance  abuse?: Yes Suicide prevention information given to non-admitted patients: Not applicable  Risk to Others within the past 6 months Homicidal Ideation: No Does patient have any lifetime risk of violence toward others beyond the six months prior to admission? : No Thoughts of Harm to Others: No Current Homicidal Intent: No Current Homicidal Plan: No Access to Homicidal Means: No Identified Victim: None History of harm to others?: No Assessment of Violence: None Noted Violent Behavior Description: Pt denies history of violence Does patient have access to weapons?: No Criminal Charges Pending?: No Does patient have a court date: No Is patient on probation?: No  Psychosis Hallucinations: None noted Delusions: None noted  Mental Status Report Appearance/Hygiene: Disheveled Eye Contact: Good Motor Activity: Unremarkable Speech: Logical/coherent Level of Consciousness: Alert, Crying Mood: Depressed, Helpless Affect: Depressed Anxiety Level: Minimal Thought Processes: Coherent, Relevant Judgement: Unimpaired Orientation: Person, Place, Time, Situation, Appropriate for developmental age Obsessive Compulsive Thoughts/Behaviors: None  Cognitive Functioning Concentration: Fair Memory: Recent Intact, Remote Intact IQ: Average Insight: Fair Impulse Control: Fair Appetite: Fair Weight Loss: 0 Weight Gain: 0 Sleep: Decreased Total Hours of Sleep: 5 Vegetative Symptoms: None  ADLScreening [facility] Assessment Services) Patient's cognitive ability adequate to safely complete daily activities?: Yes Patient able to express need for assistance with ADLs?: Yes Independently performs ADLs?: Yes (appropriate for developmental age)  Prior Inpatient Therapy Prior Inpatient Therapy: Yes Prior Therapy Dates: 03/2016, 02/2016 Prior Therapy Facilty/Provider(s): [facility] Reason for Treatment: Alcohol Use  Prior Outpatient Therapy Prior Outpatient Therapy: No  Prior Therapy Dates:  NA Prior Therapy Facilty/Provider(s): NA Reason for Treatment: NA Does patient have an ACCT team?: No Does patient have Intensive In-House Services?  : No Does patient have [facility] services? : No Does patient have P4CC services?: No  ADL Screening (condition at time of admission) Patient's cognitive ability adequate to safely complete daily activities?: Yes Is the patient deaf or have difficulty hearing?: No Does the patient have difficulty seeing, even when wearing glasses/contacts?: No Does the patient have difficulty concentrating, remembering, or making decisions?: No Patient able to express need for assistance with ADLs?: Yes Does the patient have difficulty dressing or bathing?: No Independently performs ADLs?: Yes (appropriate for developmental age) Does the patient have difficulty walking or climbing stairs?: No Weakness of Legs: Left Weakness of Arms/Hands: None  Home Assistive Devices/Equipment Home Assistive Devices/Equipment: None    Abuse/Neglect Assessment (Assessment to be complete while patient is alone) Physical Abuse: Denies Verbal Abuse: Denies Sexual Abuse: Denies Exploitation of patient/patient's resources: Denies Self-Neglect: Denies     Regulatory affairs officer (For Healthcare) Does patient have an advance directive?: No Would patient like information on creating an advanced directive?: No - patient declined information    Additional Information 1:1 In Past 12 Months?: No CIRT Risk: No Elopement Risk: No Does patient have medical clearance?: No     Disposition: Lavell Luster, AC at [facility], confirmed adult unit is at capacity. Gave clinical report to Serena Colonel, NP who said Pt meets criteria for inpatient dual-diagnosis treatment and to send Pt to [facility] for medical clearance. She will complete MSE. Contacted Kerrie, Agricultural consultant at [employer], and gave report. Pt will be transported to [employer] via [employer] and [facility] staff.  Disposition Initial  Assessment Completed for this Encounter: Yes Disposition of Patient: Other dispositions Other disposition(s): Other (Comment)  Evelena Peat, [facility], [facility], [facility] Triage Specialist [phone]   Evelena Peat 08/07/2016 7:57 PM

## 2016-08-07 NOTE — ED Triage Notes (Signed)
Pt was sent from [facility] for medical clearance and holding; pt states that he is an alcoholic and needs help; pt states that if he doesn't get some help he is going to die; pt states that he is thinking about suicide but has no plan; pt states that he drinks a pint of liquor and multiple beers every day

## 2016-08-07 NOTE — H&P (Signed)
Behavioral Health Medical Screening Exam  Kyle Mejia is an 60 y.o. male who presents to [facility] as a walk-in for screening/evaluation.   CC: "I'm depressed and can't take it anymore."   Total Time spent with patient: 20 minutes  Psychiatric Specialty Exam: Physical Exam  Constitutional: He is oriented to person, place, and time. He appears well-developed. He appears distressed.  HENT:  Head: Normocephalic.  Neck: Normal range of motion.  Cardiovascular: Normal rate, regular rhythm and normal heart sounds.   Respiratory: Effort normal and breath sounds normal.  Musculoskeletal: Normal range of motion.  Neurological: He is alert and oriented to person, place, and time.  Skin: Skin is warm and dry.  Psychiatric: His speech is normal. He is withdrawn. Cognition and memory are normal. He exhibits a depressed mood. He expresses homicidal and suicidal ideation. He expresses homicidal plans.    Review of Systems  Constitutional: Negative.   HENT: Negative.   Eyes: Negative.   Respiratory: Negative.   Cardiovascular: Negative.   Gastrointestinal: Negative.   Genitourinary: Negative.   Skin: Negative.   Psychiatric/Behavioral: Positive for depression, substance abuse and suicidal ideas. The patient is nervous/anxious.     Blood pressure (!) 162/78, pulse 93, temperature 98.3 F (36.8 C), temperature source Oral, resp. rate (!) 22, SpO2 98 %.There is no height or weight on file to calculate BMI.  General Appearance: Disheveled  Eye Contact:  Good  Speech:  Clear and Coherent and Normal Rate  Volume:  Normal  Mood:  Depressed and Hopeless  Affect:  Congruent and Tearful  Thought Process:  Coherent  Orientation:  Full (Time, Place, and Person)  Thought Content:  Logical  Suicidal Thoughts:  Yes.  with intent/plan  Homicidal Thoughts:  No  Memory:  Immediate;   Good Recent;   Good Remote;   Fair  Judgement:  Fair  Insight:  Fair  Psychomotor Activity:  Restlessness   Concentration: Concentration: Good and Attention Span: Good  Recall:  [place] of Knowledge:Fair  Language: Good  Akathisia:  No  Handed:  Right  AIMS (if indicated):     Assets:  Communication Skills Desire for Improvement Resilience  Sleep:       Musculoskeletal: Strength & Muscle Tone: within normal limits Gait & Station: normal Patient leans: N/A  Blood pressure (!) 162/78, pulse 93, temperature 98.3 F (36.8 C), temperature source Oral, resp. rate (!) 22, SpO2 98 %.  Recommendations:  Based on my evaluation the patient does not appear to have an emergency medical condition. Patient will need medical screening labs and medical clearance for inpatient psychiatric placement.   Serena Colonel, FNP-BC [place] 08/07/2016, 8:41 PM   Agree with above and plan.

## 2016-08-08 ENCOUNTER — Encounter (HOSPITAL_COMMUNITY): Payer: Self-pay

## 2016-08-08 ENCOUNTER — Inpatient Hospital Stay (HOSPITAL_COMMUNITY)
Admission: AD | Admit: 2016-08-08 | Discharge: 2016-08-15 | DRG: 897 | Disposition: A | Discharge status: Home / Self Care | Payer: Federal, State, Local not specified - Other | Source: Intra-hospital | Attending: Psychiatry | Admitting: Psychiatry

## 2016-08-08 DIAGNOSIS — Z0279 Encounter for issue of other medical certificate: Secondary | ICD-10-CM | POA: Diagnosis not present

## 2016-08-08 DIAGNOSIS — F10239 Alcohol dependence with withdrawal, unspecified: Secondary | ICD-10-CM | POA: Diagnosis not present

## 2016-08-08 DIAGNOSIS — F1090 Alcohol use, unspecified, uncomplicated: Secondary | ICD-10-CM | POA: Diagnosis present

## 2016-08-08 DIAGNOSIS — B182 Chronic viral hepatitis C: Secondary | ICD-10-CM | POA: Diagnosis present

## 2016-08-08 DIAGNOSIS — F3289 Other specified depressive episodes: Secondary | ICD-10-CM

## 2016-08-08 DIAGNOSIS — F1014 Alcohol abuse with alcohol-induced mood disorder: Principal | ICD-10-CM | POA: Diagnosis present

## 2016-08-08 DIAGNOSIS — F1024 Alcohol dependence with alcohol-induced mood disorder: Secondary | ICD-10-CM | POA: Diagnosis not present

## 2016-08-08 DIAGNOSIS — Y904 Blood alcohol level of 80-99 mg/100 ml: Secondary | ICD-10-CM | POA: Diagnosis present

## 2016-08-08 DIAGNOSIS — F17210 Nicotine dependence, cigarettes, uncomplicated: Secondary | ICD-10-CM | POA: Diagnosis present

## 2016-08-08 DIAGNOSIS — F339 Major depressive disorder, recurrent, unspecified: Secondary | ICD-10-CM

## 2016-08-08 DIAGNOSIS — F1020 Alcohol dependence, uncomplicated: Secondary | ICD-10-CM | POA: Diagnosis present

## 2016-08-08 DIAGNOSIS — F329 Major depressive disorder, single episode, unspecified: Secondary | ICD-10-CM | POA: Diagnosis present

## 2016-08-08 DIAGNOSIS — Z8249 Family history of ischemic heart disease and other diseases of the circulatory system: Secondary | ICD-10-CM | POA: Diagnosis not present

## 2016-08-08 DIAGNOSIS — IMO0002 Reserved for concepts with insufficient information to code with codable children: Secondary | ICD-10-CM | POA: Diagnosis present

## 2016-08-08 MED ORDER — LORAZEPAM 2 MG/ML IJ SOLN: 1.0000 mg | Freq: Four times a day (QID) | INTRAMUSCULAR | Status: DC | PRN

## 2016-08-08 MED ORDER — FOLIC ACID 1 MG PO TABS
1.0000 mg | ORAL_TABLET | Freq: Every day | ORAL | Status: DC
  Administered 2016-08-08: 1 mg via ORAL
  Filled 2016-08-08: qty 1

## 2016-08-08 MED ORDER — NICOTINE 21 MG/24HR TD PT24
21.0000 mg | MEDICATED_PATCH | Freq: Every day | TRANSDERMAL | Status: DC
  Administered 2016-08-08: 21 mg via TRANSDERMAL
  Filled 2016-08-08: qty 1

## 2016-08-08 MED ORDER — ONDANSETRON HCL 4 MG PO TABS: 4.0000 mg | ORAL_TABLET | Freq: Three times a day (TID) | ORAL | Status: DC | PRN

## 2016-08-08 MED ORDER — IBUPROFEN 200 MG PO TABS: 600.0000 mg | ORAL_TABLET | Freq: Three times a day (TID) | ORAL | Status: DC | PRN

## 2016-08-08 MED ORDER — INFLUENZA VAC SPLIT QUAD 0.5 ML IM SUSY
0.5000 mL | PREFILLED_SYRINGE | INTRAMUSCULAR | Status: AC
  Administered 2016-08-09: 0.5 mL via INTRAMUSCULAR
  Filled 2016-08-08: qty 0.5

## 2016-08-08 MED ORDER — DULOXETINE HCL 20 MG PO CPEP: 20.0000 mg | ORAL_CAPSULE | Freq: Every day | ORAL | Status: DC

## 2016-08-08 MED ORDER — ALUM & MAG HYDROXIDE-SIMETH 200-200-20 MG/5ML PO SUSP: 30.0000 mL | ORAL | Status: DC | PRN

## 2016-08-08 MED ORDER — LOPERAMIDE HCL 2 MG PO CAPS: 2.0000 mg | ORAL_CAPSULE | ORAL | Status: AC | PRN

## 2016-08-08 MED ORDER — VITAMIN B-1 100 MG PO TABS
100.0000 mg | ORAL_TABLET | Freq: Every day | ORAL | Status: DC
  Administered 2016-08-09 – 2016-08-13 (×5): 100 mg via ORAL
  Filled 2016-08-08 (×6): qty 1

## 2016-08-08 MED ORDER — ACETAMINOPHEN 325 MG PO TABS: 650.0000 mg | ORAL_TABLET | Freq: Four times a day (QID) | ORAL | Status: DC | PRN

## 2016-08-08 MED ORDER — LORAZEPAM 1 MG PO TABS
1.0000 mg | ORAL_TABLET | Freq: Every day | ORAL | Status: AC
  Administered 2016-08-12: 1 mg via ORAL
  Filled 2016-08-08: qty 1

## 2016-08-08 MED ORDER — LORAZEPAM 1 MG PO TABS
1.0000 mg | ORAL_TABLET | Freq: Four times a day (QID) | ORAL | Status: AC
  Administered 2016-08-08 – 2016-08-09 (×5): 1 mg via ORAL
  Filled 2016-08-08 (×5): qty 1

## 2016-08-08 MED ORDER — ACETAMINOPHEN 325 MG PO TABS: 650.0000 mg | ORAL_TABLET | ORAL | Status: DC | PRN

## 2016-08-08 MED ORDER — NAPROXEN 500 MG PO TABS
500.0000 mg | ORAL_TABLET | Freq: Two times a day (BID) | ORAL | Status: DC | PRN
  Administered 2016-08-08 – 2016-08-10 (×3): 500 mg via ORAL
  Filled 2016-08-08 (×3): qty 1

## 2016-08-08 MED ORDER — THIAMINE HCL 100 MG/ML IJ SOLN: 100.0000 mg | Freq: Every day | INTRAMUSCULAR | Status: DC

## 2016-08-08 MED ORDER — NICOTINE 21 MG/24HR TD PT24
21.0000 mg | MEDICATED_PATCH | Freq: Every day | TRANSDERMAL | Status: DC
  Filled 2016-08-08 (×2): qty 1

## 2016-08-08 MED ORDER — LORAZEPAM 1 MG PO TABS
1.0000 mg | ORAL_TABLET | Freq: Four times a day (QID) | ORAL | Status: DC | PRN
Start: 2016-08-08 — End: 2016-08-08
  Administered 2016-08-08 (×2): 1 mg via ORAL
  Filled 2016-08-08 (×2): qty 1

## 2016-08-08 MED ORDER — LORAZEPAM 1 MG PO TABS
1.0000 mg | ORAL_TABLET | Freq: Two times a day (BID) | ORAL | Status: AC
  Administered 2016-08-11 (×2): 1 mg via ORAL
  Filled 2016-08-08 (×2): qty 1

## 2016-08-08 MED ORDER — ADULT MULTIVITAMIN W/MINERALS CH
1.0000 | ORAL_TABLET | Freq: Every day | ORAL | Status: DC
  Administered 2016-08-09 – 2016-08-15 (×7): 1 via ORAL
  Filled 2016-08-08 (×9): qty 1

## 2016-08-08 MED ORDER — MAGNESIUM HYDROXIDE 400 MG/5ML PO SUSP: 30.0000 mL | Freq: Every day | ORAL | Status: DC | PRN

## 2016-08-08 MED ORDER — ZOLPIDEM TARTRATE 5 MG PO TABS: 5.0000 mg | ORAL_TABLET | Freq: Every evening | ORAL | Status: DC | PRN

## 2016-08-08 MED ORDER — TRAZODONE HCL 50 MG PO TABS
50.0000 mg | ORAL_TABLET | Freq: Every evening | ORAL | Status: DC | PRN
  Administered 2016-08-08: 50 mg via ORAL
  Filled 2016-08-08: qty 1

## 2016-08-08 MED ORDER — HYDROXYZINE HCL 25 MG PO TABS: 25.0000 mg | ORAL_TABLET | Freq: Four times a day (QID) | ORAL | Status: AC | PRN

## 2016-08-08 MED ORDER — ADULT MULTIVITAMIN W/MINERALS CH
1.0000 | ORAL_TABLET | Freq: Every day | ORAL | Status: DC
  Administered 2016-08-08: 1 via ORAL
  Filled 2016-08-08: qty 1

## 2016-08-08 MED ORDER — LORAZEPAM 1 MG PO TABS
1.0000 mg | ORAL_TABLET | Freq: Four times a day (QID) | ORAL | Status: AC | PRN
  Filled 2016-08-08: qty 1

## 2016-08-08 MED ORDER — VITAMIN B-1 100 MG PO TABS
100.0000 mg | ORAL_TABLET | Freq: Every day | ORAL | Status: DC
  Administered 2016-08-08: 100 mg via ORAL
  Filled 2016-08-08: qty 1

## 2016-08-08 MED ORDER — ONDANSETRON 4 MG PO TBDP: 4.0000 mg | ORAL_TABLET | Freq: Four times a day (QID) | ORAL | Status: AC | PRN

## 2016-08-08 MED ORDER — LORAZEPAM 1 MG PO TABS
1.0000 mg | ORAL_TABLET | Freq: Three times a day (TID) | ORAL | Status: AC
  Administered 2016-08-10 (×3): 1 mg via ORAL
  Filled 2016-08-08 (×2): qty 1

## 2016-08-08 NOTE — Progress Notes (Signed)
Patient did attend the evening speaker AA meeting.  

## 2016-08-08 NOTE — ED Notes (Signed)
Pt shaky and nervous states last drink yesterday 1700p gave ativan per CIWA protocol, alert and oriented x 3 no respiratory or acute distress noted food and drink given.

## 2016-08-08 NOTE — ED Provider Notes (Signed)
[facility] DEPT Provider Note   CSN: [id number] Arrival date & time: 08/07/16  2104     History   Chief Complaint Chief Complaint  Patient presents with  . Suicidal    HPI Kyle Mejia is a 60 y.o. male.  Kyle Mejia is a 60 y.o. male with history of alcohol abuse, tobacco dependence, chronic left knee pain, HTN, depression, neuropathy, and OA presents to ED for suicidal ideation and medical clearance. Endorses depression and anxiety. He does currently have suicidal thoughts, but denies a plan. He denies homicidal ideations. Patient reports he has trouble with alcohol and has been drinking daily 1 pint of liquor and beer - pt is evasive on quantity. He endorses some jitteriness, but denies diaphoresis, hallucinations, or seizures. He is a current every day smoker. He denies recreational drug use.  No fever, trouble swallowing, changes in vision, chest pain, shortness of breath, abdominal pain, nausea, vomiting, dysuria, hematuria, rash, or syncope.       Past Medical History:  Diagnosis Date  . Chronic left shoulder pain   . Chronic pain of left knee   . History of alcoholism ([place])   . Tobacco dependence     Patient Active Problem List   Diagnosis Date Noted  . 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 Arthritis of left acromioclavicular joint 06/09/2016  . Osteoarthritis of left glenohumeral joint 06/09/2016  . Varus deformity, not elsewhere classified, left knee 06/02/2016  . Carpal tunnel syndrome 06/02/2016  . Chronic hepatitis C without hepatic coma ([place]) 05/21/2016  . Tobacco dependence 05/19/2016  . Chronic left shoulder pain 05/19/2016  . History of alcohol abuse 05/19/2016  . Loss of weight 05/19/2016  . Elevated blood pressure 05/19/2016  . Neuropathy ([place]) 05/19/2016  . Depression 05/19/2016  . Colon cancer screening 05/19/2016  . Alcohol use disorder, severe, dependence ([place]) 03/19/2016    Past Surgical History:    Procedure Laterality Date  . KNEE RECONSTRUCTION     left knee        Home Medications    Prior to Admission medications   Medication Sig Start Date End Date Taking? Authorizing Provider  DULoxetine (CYMBALTA) 20 MG capsule Take 1 capsule (20 mg total) by mouth at bedtime. For depression 05/19/16  Yes Dorena Dew, FNP  Elbasvir-Grazoprevir (ZEPATIER) 50-100 MG TABS Take 1 tablet by mouth daily. Patient not taking: Reported on 08/07/2016 08/04/16   Thayer Headings, MD  folic acid (FOLVITE) 1 MG tablet Take 1 tablet (1 mg total) by mouth daily. Patient not taking: Reported on 08/07/2016 05/19/16   Dorena Dew, FNP  gabapentin (NEURONTIN) 300 MG capsule Take 1 capsule (300 mg total) by mouth 3 (three) times daily. For agitation Patient not taking: Reported on 08/07/2016 05/19/16   Dorena Dew, FNP  meloxicam (MOBIC) 7.5 MG tablet Take 1 tablet (7.5 mg total) by mouth daily. Patient not taking: Reported on 05/19/3661 9/47/65   Elmo Putt B [place], DO  naproxen (NAPROSYN) 500 MG tablet Take 1 tablet (500 mg total) by mouth 2 (two) times daily. Patient not taking: Reported on 08/07/2016 07/01/16   Gloriann Loan, PA-C  thiamine (VITAMIN B-1) 100 MG tablet Take 1 tablet (100 mg total) by mouth daily. Patient not taking: Reported on 08/07/2016 05/19/16   Dorena Dew, FNP    Family History Family History  Problem Relation Age of Onset  . Heart disease Mother     Social History Social History  Substance Use  Topics  . Smoking status: Current Every Day Smoker    Packs/day: 0.50    Types: Cigarettes  . Smokeless tobacco: Never Used     Comment: 1/2 pk per day.   . Alcohol use Yes     Comment: 1 pint of liquor and multiple beers     Allergies   Review of patient's allergies indicates no known allergies.   Review of Systems Review of Systems  Constitutional: Negative for chills, diaphoresis and fever.  HENT: Negative for trouble swallowing.   Eyes: Negative for visual  disturbance.  Respiratory: Negative for shortness of breath.   Cardiovascular: Negative for chest pain.  Gastrointestinal: Negative for abdominal pain, nausea and vomiting.  Genitourinary: Negative for dysuria and hematuria.  Musculoskeletal: Positive for arthralgias ( chronic left knee pain). Negative for myalgias.  Skin: Negative for rash.  Neurological: Negative for syncope.  Psychiatric/Behavioral: Positive for decreased concentration and suicidal ideas. The patient is nervous/anxious.      Physical Exam Updated Vital Signs BP 154/95   Pulse 84   Temp 98.6 F (37 C) (Oral)   Resp 16   SpO2 97%   Physical Exam  Constitutional: He appears well-developed and well-nourished. No distress.  HENT:  Head: Normocephalic and atraumatic.  Mouth/Throat: Oropharynx is clear and moist. No oropharyngeal exudate.  Eyes: Conjunctivae and EOM are normal. Pupils are equal, round, and reactive to light. Right eye exhibits no discharge. Left eye exhibits no discharge. No scleral icterus.  Neck: Normal range of motion and phonation normal. Neck supple. No neck rigidity. Normal range of motion present.  Cardiovascular: Normal rate, regular rhythm, normal heart sounds and intact distal pulses.   No murmur heard. Pulmonary/Chest: Effort normal and breath sounds normal. No stridor. No respiratory distress. He has no wheezes. He has no rales.  Abdominal: Soft. Bowel sounds are normal. He exhibits no distension. There is no tenderness. There is no rigidity, no rebound, no guarding and no CVA tenderness.  Musculoskeletal: Normal range of motion.  Lymphadenopathy:    He has no cervical adenopathy.  Neurological: He is alert. He is not disoriented. Coordination and gait normal. GCS eye subscore is 4. GCS verbal subscore is 5. GCS motor subscore is 6.  Skin: Skin is warm and dry. He is not diaphoretic.  Psychiatric: His behavior is normal. He exhibits a depressed mood.     ED Treatments / Results   Labs (all labs ordered are listed, but only abnormal results are displayed) Labs Reviewed  COMPREHENSIVE METABOLIC PANEL - Abnormal; Notable for the following:       Result Value   Calcium 8.7 (*)    AST 53 (*)    Alkaline Phosphatase 130 (*)    All other components within normal limits  ETHANOL - Abnormal; Notable for the following:    Alcohol, Ethyl (B) 91 (*)    All other components within normal limits  ACETAMINOPHEN LEVEL - Abnormal; Notable for the following:    Acetaminophen (Tylenol), Serum <10 (*)    All other components within normal limits  CBC - Abnormal; Notable for the following:    Platelets 100 (*)    All other components within normal limits  URINE RAPID DRUG SCREEN, HOSP PERFORMED - Abnormal; Notable for the following:    Benzodiazepines POSITIVE (*)    All other components within normal limits  SALICYLATE LEVEL    EKG  EKG Interpretation None       Radiology No results found.  Procedures Procedures (including critical  care time)  Medications Ordered in ED Medications  alum & mag hydroxide-simeth (MAALOX/MYLANTA) 200-200-20 MG/5ML suspension 30 mL (not administered)  ondansetron (ZOFRAN) tablet 4 mg (not administered)  nicotine (NICODERM CQ - dosed in mg/24 hours) patch 21 mg (not administered)  zolpidem (AMBIEN) tablet 5 mg (not administered)  acetaminophen (TYLENOL) tablet 650 mg (not administered)  ibuprofen (ADVIL,MOTRIN) tablet 600 mg (not administered)  LORazepam (ATIVAN) tablet 1 mg (1 mg Oral Given 08/08/16 0107)    Or  LORazepam (ATIVAN) injection 1 mg ( Intravenous See Alternative 08/08/16 0107)  thiamine (VITAMIN B-1) tablet 100 mg (not administered)    Or  thiamine (B-1) injection 100 mg (not administered)  folic acid (FOLVITE) tablet 1 mg (not administered)  multivitamin with minerals tablet 1 tablet (not administered)  DULoxetine (CYMBALTA) DR capsule 20 mg (not administered)     Initial Impression / Assessment and Plan / ED  Course  I have reviewed the triage vital signs and the nursing notes.  Pertinent labs & imaging results that were available during my care of the patient were reviewed by me and considered in my medical decision making (see chart for details).  Clinical Course    Patient presents to ED for suicidal ideation and medical clearance. Patient is afebrile and non-toxic appearing in NAD. Vital signs remarkable for mildly elevated blood pressure, otherwise stable. Physical exam is re-assuring. Mild increase in AST and alk phos; mildly low plts - stable compared to previous - may be secondary to HCV. UDS +BZDs. Patient is medically cleared. Psych hold orders, CIWA protocol initiated, and home medications reconciled. Will consult TTS regarding disposition for SI.   Final Clinical Impressions(s) / ED Diagnoses   Final diagnoses:  Suicidal ideation  Alcohol abuse    New Prescriptions New Prescriptions   No medications on file     Roxanna Mew, PA-C 08/08/16 0211    Charlesetta Shanks, MD 08/10/16 [phone]

## 2016-08-08 NOTE — Progress Notes (Signed)
Writer has observed patient up in the dayroom watching tv briefly this evening. Writer spoke with him 1:1 and he reports that he was feeling nervous. Writer informed him of medications scheduled for hs and explained that one of his medicines, ativan would help with his feeling nervous. He was informed of the routine for the evening and wake up call in the am and took his medications. He was informed about seeing his doctor on tomorrow to discuss his medications and social worker to discuss his needs for treatment once discharged. Support and encouragement given and safety maintained on unit with 15 min checks.

## 2016-08-08 NOTE — Progress Notes (Signed)
Kyle Mejia is a 60 y.o. male voluntarily admitted for SI with no plan "but if I didn't get in somewhere for treatment I was going to find a way to kill myself."  He also seeks detox from alcohol.  Medically cleared at [facility].  Situation on admission: patient has been reportedly drinking 1 quart of liquor plus several beers per day on and off for the past 35 years.  CIWA 12 on arrival.  Denies taking meds for over a month "I was prescribed Cymbalta but I don't like how it makes me feel."  Lives with significant other in a private residence that is rented.  Medical and surgical history reviewed and noted below, pertinent history includes left knee reconstruction with chronic pain.  Basic search of patient completed with skin check completed, no findings except for small bruises on left arm.  Belongings reviewed and noted on belongings record.  Oriented to unit and rules, consents and treatment agreement reviewed with patient and signed.  No Known Allergies Past Medical History:  Diagnosis Date  . Chronic left shoulder pain   . Chronic pain of left knee   . History of alcoholism ([place])   . Tobacco dependence    Past Surgical History:  Procedure Laterality Date  . KNEE RECONSTRUCTION     left knee

## 2016-08-08 NOTE — Plan of Care (Signed)
Problem: Medication: Goal: Compliance with prescribed medication regimen will improve Outcome: Progressing Patient was compliant with his scheduled hs medications.

## 2016-08-08 NOTE — Tx Team (Signed)
Initial Treatment Plan 08/08/2016 4:46 PM [name] [id number]    PATIENT STRESSORS: Financial difficulties Medication change or noncompliance Substance abuse   PATIENT STRENGTHS: Ability for insight Communication skills General fund of knowledge Motivation for treatment/growth   PATIENT IDENTIFIED PROBLEMS: "Get clean"  "To feel better"                   DISCHARGE CRITERIA:  Ability to meet basic life and health needs Improved stabilization in mood, thinking, and/or behavior Motivation to continue treatment in a less acute level of care Need for constant or close observation no longer present Reduction of life-threatening or endangering symptoms to within safe limits Safe-care adequate arrangements made Verbal commitment to aftercare and medication compliance Withdrawal symptoms are absent or subacute and managed without 24-hour nursing intervention  PRELIMINARY DISCHARGE PLAN: Outpatient therapy  PATIENT/FAMILY INVOLVEMENT: This treatment plan has been presented to and reviewed with the patient, Kyle Mejia, and/or family member.  The patient and family have been given the opportunity to ask questions and make suggestions.  Milbert Coulter, RN 08/08/2016, 4:46 PM

## 2016-08-08 NOTE — ED Notes (Signed)
Pt's belongings given to [place] transportation with patient to [facility].

## 2016-08-08 NOTE — ED Notes (Signed)
Report given to Dan at [facility].

## 2016-08-09 ENCOUNTER — Encounter (HOSPITAL_COMMUNITY): Payer: Self-pay | Admitting: Psychiatry

## 2016-08-09 DIAGNOSIS — F3289 Other specified depressive episodes: Secondary | ICD-10-CM

## 2016-08-09 DIAGNOSIS — F10239 Alcohol dependence with withdrawal, unspecified: Secondary | ICD-10-CM | POA: Diagnosis present

## 2016-08-09 DIAGNOSIS — Z0279 Encounter for issue of other medical certificate: Secondary | ICD-10-CM | POA: Diagnosis not present

## 2016-08-09 DIAGNOSIS — F1024 Alcohol dependence with alcohol-induced mood disorder: Secondary | ICD-10-CM

## 2016-08-09 MED ORDER — TRAZODONE HCL 50 MG PO TABS
50.0000 mg | ORAL_TABLET | Freq: Every day | ORAL | Status: DC
  Administered 2016-08-10: 50 mg via ORAL
  Filled 2016-08-09 (×3): qty 1

## 2016-08-09 MED ORDER — GABAPENTIN 300 MG PO CAPS
300.0000 mg | ORAL_CAPSULE | Freq: Three times a day (TID) | ORAL | Status: DC
  Administered 2016-08-09 – 2016-08-14 (×17): 300 mg via ORAL
  Filled 2016-08-09 (×21): qty 1

## 2016-08-09 MED ORDER — NICOTINE POLACRILEX 2 MG MT GUM
2.0000 mg | CHEWING_GUM | OROMUCOSAL | Status: DC | PRN
  Administered 2016-08-09 (×2): 2 mg via ORAL
  Filled 2016-08-09: qty 1

## 2016-08-09 NOTE — Plan of Care (Signed)
Problem: Coping: Goal: Ability to demonstrate self-control will improve Outcome: Progressing Patient is calm and cooperative at this time.

## 2016-08-09 NOTE — [facility] Group Notes (Signed)
[place] LCSW Group Therapy 08/09/2016  1:15 pm  Type of Therapy: Group Therapy Participation Level: Active  Participation Quality: Attentive, Sharing and Supportive  Affect: Blunted  Cognitive: Alert and Oriented  Insight: Developing/Improving and Engaged  Engagement in Therapy: Developing/Improving and Engaged  Modes of Intervention: Clarification, Confrontation, Discussion, Education, Exploration,  Limit-setting, Orientation, Problem-solving, Rapport Building, Art therapist, Socialization and Support  Summary of Progress/Problems: Pt identified obstacles faced currently and processed barriers involved in overcoming these obstacles. Pt identified steps necessary for overcoming these obstacles and explored motivation (internal and external) for facing these difficulties head on. Pt further identified one area of concern in their lives and chose a goal to focus on for today. Patient discussed struggling with apathy and procrastination. He reports that he finds it helpful to have an outlet to discuss his problems and receive support from others such as AA. CSW and other group members provided patient with emotional support and encouragement.   Tilden Fossa, LCSW Clinical Social Worker [facility] [phone]

## 2016-08-09 NOTE — Progress Notes (Signed)
Recreation Therapy Notes  Date: 08/09/16 Time: 0930 Location: [address] Group Room  Group Topic: Stress Management  Goal Area(s) Addresses:  Patient will verbalize importance of using healthy stress management.  Patient will identify positive emotions associated with healthy stress management.   Intervention: Stress Management  Activity :  Progressive Muscle Relaxation.  LRT introduced the technique of progressive muscle relaxation.  LRT read a script so patients could participate in the activity.  Patients were to follow along as LRT read script.  Education:  Stress Management, Discharge Planning.   Education Outcome: Needs additional education  Clinical Observations/Feedback: Pt did not attend group.    Victorino Sparrow, LRT/CTRS    Victorino Sparrow A 08/09/2016 12:47 PM

## 2016-08-09 NOTE — H&P (Signed)
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 Major depressive disorder, recurrent episode ([place]) [F33.9] 08/08/2016  . Alcohol use disorder ([place]) [F10.99] 08/08/2016  . 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 Arthritis of left acromioclavicular joint [M19.90] 06/09/2016  . 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 Varus deformity, not elsewhere classified, left knee [M21.162] 06/02/2016  . Carpal tunnel syndrome [G56.00] 06/02/2016  . Chronic hepatitis C without hepatic coma ([place]) [B18.2] 05/21/2016  . Tobacco dependence [F17.200] 05/19/2016  . Chronic left shoulder pain [M25.512, G89.29] 05/19/2016  . History of alcohol abuse [F10.10] 05/19/2016  . Loss of weight [R63.4] 05/19/2016  . Elevated blood pressure [R03.0] 05/19/2016  . Neuropathy ([place]) [G62.9] 05/19/2016  . Depression [F32.9] 05/19/2016  . Colon cancer screening [Z12.11] 05/19/2016  . Alcohol use disorder, severe, dependence ([place]) [F10.20] 03/19/2016   History of Present Illness: This is an admission assessment for Kyle Mejia, a 60 year old Caucasian male with hx of alcoholism, chronic. Admitted to the [facility] adult unit as a walk-in, medically cleared at the [facility]. During this assessment, [name] reports, "I have been having a lot of problems with cravings & increased alcohol consumption. I relapsed after 60 days sobriety. I was drinking up to a half gallon of liquor daily for 3 weeks. I drink because I crave it. I also have bad anxiety issues. I may be self-medicating myself. I have been depressed for a long time, not sure why I get so depressed.  Whenever my depression & drinking get bad, I will become suicidal. I attempted suicide by overdose on my father's medicines a long time ago. I have hx of drug use (opioid, cocaine, THC). I stopped because I was never addicted to them. I have had substance abuse treatment at the [facility] a long time ago & [facility] about 6 months ago. I will start treatment for Hepatitis C very soon. I have the shakes & the sweats right now"  Associated Signs/Symptoms:  Depression Symptoms:  depressed mood, insomnia, feelings of worthlessness/guilt, anxiety,  (Hypo) Manic Symptoms:  Denies any hypomanic epiosdes  Anxiety Symptoms:  Excessive Worry,  Psychotic Symptoms:  Denies any hallucinations, delusional thoughts or paranoia.  PTSD Symptoms: Denies any hx of PTSD  Total Time spent with patient: 1 hour  Past Psychiatric History: Alcoholism, chronic  Is the patient at risk to self? No.  Has the patient been a risk to self in the past 6 months? Yes.    Has the patient been a risk to self within the distant past? Yes.    Is the patient a risk to others? No.  Has the patient been a risk to others in the past 6 months? No.  Has the patient been a risk to others within the distant past? No.   Prior Inpatient Therapy: [facility] a while ago). Prior Outpatient Therapy: Yes  Alcohol Screening: 1. How often do you have a drink containing alcohol?: 4 or more times a week 2. How many drinks containing alcohol do you have on a typical day when you are drinking?: 7, 8, or 9 3. How often do you have six or more drinks on one occasion?: Daily or almost daily  Preliminary Score: 7 4. How often during the last year have you found that you were not able to stop drinking once you had started?: Daily or almost daily 5. How often during the last year have you failed to do what was normally expected from you becasue of drinking?: Weekly 6. How often during the last year have you needed a first drink in the morning to  get yourself going after a heavy drinking session?: Daily or almost daily 7. How often during the last year have you had a feeling of guilt of remorse after drinking?: Daily or almost daily 8. How often during the last year have you been unable to remember what happened the night before because you had been drinking?: Never 9. Have you or someone else been injured as a result of your drinking?: No 10. Has a relative or friend or a doctor or another health worker been concerned about your drinking or suggested you cut down?: Yes, during the last year Alcohol Use Disorder Identification Test Final Score (AUDIT): 30 Brief Intervention: Yes  Substance Abuse History in the last 12 months:  Yes.    Consequences of Substance Abuse: Medical Consequences:  Liver damage, Possible death by overdose Legal Consequences:  Arrests, jail time, Loss of driving privilege. Family Consequences:  Family discord, divorce and or separation.  Previous Psychotropic Medications: Yes, (Cymbalta)  Psychological Evaluations: Yes   Past Medical History:  Past Medical History:  Diagnosis Date  . Chronic left shoulder pain   . Chronic pain of left knee   . History of alcoholism ([place])   . Tobacco dependence     Past Surgical History:  Procedure Laterality Date  . KNEE RECONSTRUCTION     left knee    Family History:  Family History  Problem Relation Age of Onset  . Heart disease Mother    Family Psychiatric  History: Alcoholism: Maternal uncles.  Tobacco Screening: Have you used any form of tobacco in the last 30 days? (Cigarettes, Smokeless Tobacco, Cigars, and/or Pipes): Yes Tobacco use, Select all that apply: 5 or more cigarettes per day Are you interested in Tobacco Cessation Medications?: Yes, will notify MD for an order Counseled patient on smoking cessation including recognizing danger situations, developing coping skills and basic information about quitting provided: Yes  Social History:  History   Alcohol Use  . Yes    Comment: 1 pint of liquor and multiple beers     History  Drug Use No    Additional Social History:  Allergies:  No Known Allergies  Lab Results:  Results for orders placed or performed during the hospital encounter of 08/07/16 (from the past 48 hour(s))  Rapid urine drug screen (hospital performed)     Status: Abnormal   Collection Time: 08/07/16  9:42 PM  Result Value Ref Range   Opiates NONE DETECTED NONE DETECTED   Cocaine NONE DETECTED NONE DETECTED   Benzodiazepines POSITIVE (A) NONE DETECTED   Amphetamines NONE DETECTED NONE DETECTED   Tetrahydrocannabinol NONE DETECTED NONE DETECTED   Barbiturates NONE DETECTED NONE DETECTED    Comment:        DRUG SCREEN FOR MEDICAL PURPOSES ONLY.  IF CONFIRMATION IS NEEDED FOR ANY PURPOSE, NOTIFY LAB WITHIN 5 DAYS.        LOWEST DETECTABLE LIMITS FOR URINE DRUG SCREEN Drug Class       Cutoff (ng/mL) Amphetamine      1000 Barbiturate      200 Benzodiazepine   470 Tricyclics  300 Opiates          300 Cocaine          300 THC              50   Comprehensive metabolic panel     Status: Abnormal   Collection Time: 08/07/16  9:55 PM  Result Value Ref Range   Sodium 139 135 - 145 mmol/L   Potassium 3.7 3.5 - 5.1 mmol/L   Chloride 109 101 - 111 mmol/L   CO2 22 22 - 32 mmol/L   Glucose, Bld 97 65 - 99 mg/dL   BUN 10 6 - 20 mg/dL   Creatinine, Ser 0.67 0.61 - 1.24 mg/dL   Calcium 8.7 (L) 8.9 - 10.3 mg/dL   Total Protein 7.5 6.5 - 8.1 g/dL   Albumin 3.5 3.5 - 5.0 g/dL   AST 53 (H) 15 - 41 U/L   ALT 42 17 - 63 U/L   Alkaline Phosphatase 130 (H) 38 - 126 U/L   Total Bilirubin 1.0 0.3 - 1.2 mg/dL   GFR calc non Af Amer >60 >60 mL/min   GFR calc Af Amer >60 >60 mL/min    Comment: (NOTE) The eGFR has been calculated using the CKD EPI equation. This calculation has not been validated in all clinical situations. eGFR's persistently <60 mL/min signify possible Chronic Kidney Disease.    Anion gap 8 5  - 15  Ethanol     Status: Abnormal   Collection Time: 08/07/16  9:55 PM  Result Value Ref Range   Alcohol, Ethyl (B) 91 (H) <5 mg/dL    Comment:        LOWEST DETECTABLE LIMIT FOR SERUM ALCOHOL IS 5 mg/dL FOR MEDICAL PURPOSES ONLY   Salicylate level     Status: None   Collection Time: 08/07/16  9:55 PM  Result Value Ref Range   Salicylate Lvl <4.2 2.8 - 30.0 mg/dL  Acetaminophen level     Status: Abnormal   Collection Time: 08/07/16  9:55 PM  Result Value Ref Range   Acetaminophen (Tylenol), Serum <10 (L) 10 - 30 ug/mL    Comment:        THERAPEUTIC CONCENTRATIONS VARY SIGNIFICANTLY. A RANGE OF 10-30 ug/mL MAY BE AN EFFECTIVE CONCENTRATION FOR MANY PATIENTS. HOWEVER, SOME ARE BEST TREATED AT CONCENTRATIONS OUTSIDE THIS RANGE. ACETAMINOPHEN CONCENTRATIONS >150 ug/mL AT 4 HOURS AFTER INGESTION AND >50 ug/mL AT 12 HOURS AFTER INGESTION ARE OFTEN ASSOCIATED WITH TOXIC REACTIONS.   cbc     Status: Abnormal   Collection Time: 08/07/16  9:55 PM  Result Value Ref Range   WBC 8.4 4.0 - 10.5 K/uL   RBC 4.38 4.22 - 5.81 MIL/uL   Hemoglobin 14.5 13.0 - 17.0 g/dL   HCT 40.9 39.0 - 52.0 %   MCV 93.4 78.0 - 100.0 fL   MCH 33.1 26.0 - 34.0 pg   MCHC 35.5 30.0 - 36.0 g/dL   RDW 12.8 11.5 - 15.5 %   Platelets 100 (L) 150 - 400 K/uL    Comment: SPECIMEN CHECKED FOR CLOTS REPEATED TO VERIFY PLATELET COUNT CONFIRMED BY SMEAR    Blood Alcohol level:  Lab Results  Component Value Date   ETH 91 (H) 08/07/2016   ETH 273 (H) 70/62/3762   Metabolic Disorder Labs:  Lab Results  Component Value Date   HGBA1C 5.0 05/19/2016   MPG 97 05/19/2016   No results found for: PROLACTIN Lab Results  Component Value Date   CHOL 133  05/19/2016   TRIG 73 05/19/2016   HDL 39 (L) 05/19/2016   CHOLHDL 3.4 05/19/2016   VLDL 15 05/19/2016   LDLCALC 79 05/19/2016   Current Medications: Current Facility-Administered Medications  Medication Dose Route Frequency Provider Last Rate Last Dose  .  acetaminophen (TYLENOL) tablet 650 mg  650 mg Oral Q6H PRN Kerrie Buffalo, NP      . alum & mag hydroxide-simeth (MAALOX/MYLANTA) 200-200-20 MG/5ML suspension 30 mL  30 mL Oral Q4H PRN Kerrie Buffalo, NP      . hydrOXYzine (ATARAX/VISTARIL) tablet 25 mg  25 mg Oral Q6H PRN Kerrie Buffalo, NP      . Influenza vac split quadrivalent PF (FLUARIX) injection 0.5 mL  0.5 mL Intramuscular Tomorrow-1000 Fernando A Cobos, MD      . loperamide (IMODIUM) capsule 2-4 mg  2-4 mg Oral PRN Kerrie Buffalo, NP      . LORazepam (ATIVAN) tablet 1 mg  1 mg Oral Q6H PRN Kerrie Buffalo, NP      . LORazepam (ATIVAN) tablet 1 mg  1 mg Oral QID Kerrie Buffalo, NP   1 mg at 08/09/16 0240   Followed by  . [START ON 08/10/2016] LORazepam (ATIVAN) tablet 1 mg  1 mg Oral TID Kerrie Buffalo, NP       Followed by  . [START ON 08/11/2016] LORazepam (ATIVAN) tablet 1 mg  1 mg Oral BID Kerrie Buffalo, NP       Followed by  . [START ON 08/12/2016] LORazepam (ATIVAN) tablet 1 mg  1 mg Oral Daily Kerrie Buffalo, NP      . magnesium hydroxide (MILK OF MAGNESIA) suspension 30 mL  30 mL Oral Daily PRN Kerrie Buffalo, NP      . multivitamin with minerals tablet 1 tablet  1 tablet Oral Daily Kerrie Buffalo, NP   1 tablet at 08/09/16 0808  . naproxen (NAPROSYN) tablet 500 mg  500 mg Oral Q12H PRN Kerrie Buffalo, NP   500 mg at 08/09/16 0810  . nicotine polacrilex (NICORETTE) gum 2 mg  2 mg Oral PRN Ursula Alert, MD   2 mg at 08/09/16 0811  . ondansetron (ZOFRAN-ODT) disintegrating tablet 4 mg  4 mg Oral Q6H PRN Kerrie Buffalo, NP      . thiamine (VITAMIN B-1) tablet 100 mg  100 mg Oral Daily Kerrie Buffalo, NP   100 mg at 08/09/16 9735  . traZODone (DESYREL) tablet 50 mg  50 mg Oral QHS PRN,MR X 1 Nanci Pina, FNP   50 mg at 08/08/16 2124   PTA Medications: Prescriptions Prior to Admission  Medication Sig Dispense Refill Last Dose  . DULoxetine (CYMBALTA) 20 MG capsule Take 1 capsule (20 mg total) by mouth at bedtime. For depression  30 capsule 1 ~a month ago  . Elbasvir-Grazoprevir (ZEPATIER) 50-100 MG TABS Take 1 tablet by mouth daily. (Patient not taking: Reported on 08/07/2016) 28 tablet 2 Not Taking at Unknown time  . folic acid (FOLVITE) 1 MG tablet Take 1 tablet (1 mg total) by mouth daily. (Patient not taking: Reported on 08/07/2016) 30 tablet 11 Not Taking at Unknown time  . gabapentin (NEURONTIN) 300 MG capsule Take 1 capsule (300 mg total) by mouth 3 (three) times daily. For agitation (Patient not taking: Reported on 08/07/2016) 90 capsule 1 Not Taking at Unknown time  . meloxicam (MOBIC) 7.5 MG tablet Take 1 tablet (7.5 mg total) by mouth daily. (Patient not taking: Reported on 08/07/2016) 30 tablet 1 Not Taking at Unknown time  .  naproxen (NAPROSYN) 500 MG tablet Take 1 tablet (500 mg total) by mouth 2 (two) times daily. (Patient not taking: Reported on 08/07/2016) 30 tablet 0 Not Taking at Unknown time  . thiamine (VITAMIN B-1) 100 MG tablet Take 1 tablet (100 mg total) by mouth daily. (Patient not taking: Reported on 08/07/2016) 30 tablet 11 Not Taking at Unknown time   Musculoskeletal: Strength & Muscle Tone: within normal limits Gait & Station: normal Patient leans: N/A  Psychiatric Specialty Exam: Physical Exam  Constitutional: He appears well-developed.  HENT:  Head: Normocephalic.  Eyes: Pupils are equal, round, and reactive to light.  Neck: Normal range of motion.  Cardiovascular:  Elevated pulse rate  Respiratory: Effort normal.  GI: Soft.  Genitourinary:  Genitourinary Comments: Denies any issues in this area  Musculoskeletal: Normal range of motion.  Neurological: He is alert.  Skin: Skin is warm.    Review of Systems  Constitutional: Positive for chills, diaphoresis and malaise/fatigue.  HENT: Negative.   Eyes: Negative.   Respiratory: Negative.   Cardiovascular: Negative.   Gastrointestinal: Positive for nausea.  Genitourinary: Negative.   Musculoskeletal: Positive for joint pain and  myalgias.  Skin: Negative.   Neurological: Negative.   Endo/Heme/Allergies: Negative.   Psychiatric/Behavioral: Positive for depression, substance abuse (Hx. Alcoholism, chronic) and suicidal ideas. Negative for hallucinations and memory loss. The patient is nervous/anxious and has insomnia.     Blood pressure 136/82, pulse (!) 104, temperature 98.1 F (36.7 C), temperature source Oral, resp. rate 16, height _0  (1.727 m), weight 81.6 kg (180 lb), SpO2 96 %.Body mass index is 27.37 kg/m.  General Appearance: Casual  Eye Contact:  Fair  Speech:  Clear and Coherent and Normal Rate  Volume:  Normal  Mood:  Anxious and Depressed  Affect:  Flat  Thought Process:  Coherent  Orientation:  Full (Time, Place, and Person)  Thought Content:  Ruminations, denies any hallucinations, delusions or paranoia.  Suicidal Thoughts:  Currrently denies any suicidal thoughts, plans or intent  Homicidal Thoughts:  Denies any thoughts, plans or intent  Memory:  Immediate;   Good Recent;   Good Remote;   Good  Judgement:  Fair  Insight:  Present  Psychomotor Activity:  Tremor  Concentration:  Concentration: Fair and Attention Span: Fair  Recall:  Good  Fund of Knowledge:  Fair  Language:  Good  Akathisia:  Negative  Handed:  Right  AIMS (if indicated):     Assets:  Communication Skills Desire for Improvement  ADL's:  Intact  Cognition:  WNL  Sleep:  Number of Hours: 4.75   Treatment Plan Summary: Daily contact with patient to assess and evaluate symptoms and progress in treatment and Medication management: 1. Admit for crisis management and stabilization, estimated length of stay 3-5 days.  2. Medication management to reduce current symptoms to base line and improve the patient's overall level of functioning  3. Treat health problems as indicated.  4. Develop treatment plan to decrease risk of relapse upon discharge and the need for readmission.  5. Psycho-social education regarding relapse  prevention and self care.  6. Health care follow up as needed for medical problems.  7. Review, reconcile, and reinstate any pertinent home medications for other health issues where appropriate. 8. Call for consults with hospitalist for any additional specialty patient care services as needed.  Observation Level/Precautions:  15 minute checks  Laboratory:  Per ED, BAL 91, UDS + for Benzodiazepine  Psychotherapy: Group sessions   Medications: See [facility]  Consultations: As needed   Discharge Concerns: Safety, sobriety   Estimated LOS: 3-5 days  Other: Admit to [address]    Physician Treatment Plan for Primary Diagnosis: Major depressive disorder, recurrent episodes.   Long Term Goal(s): Improvement in symptoms so as ready for discharge  Short Term Goals: Ability to identify changes in lifestyle to reduce recurrence of condition will improve, Ability to verbalize feelings will improve, Ability to disclose and discuss suicidal ideas, Ability to demonstrate self-control will improve and Ability to identify triggers associated with substance abuse/mental health issues will improve  Physician Treatment Plan for Secondary Diagnosis: Active Problems:   Alcohol use disorder ([place])  Long Term Goal(s): Improvement in symptoms so as ready for discharge  Short Term Goals: Ability to disclose and discuss suicidal ideas, Ability to identify and develop effective coping behaviors will improve, Compliance with prescribed medications will improve and Ability to identify triggers associated with substance abuse/mental health issues will improve  I certify that inpatient services furnished can reasonably be expected to improve the patient's condition.    Encarnacion Slates, NP, PMHNP, FNP-BC 9/18/201710:25 AM

## 2016-08-09 NOTE — Progress Notes (Signed)
D    Pt mostly isolated to his room    He does not interact with others and did not attend group this evening   He was prompted to come get his medications for the night but hasnt come to get them yet A    Verbal support and encouragement given    Medications offered   Q 15 min checks R    Pt is safe at present

## 2016-08-09 NOTE — BHH Group Notes (Addendum)
Pt attended spiritual care group on grief and loss facilitated by chaplain Jerene Pitch   Group opened with brief discussion and psycho-social ed around grief and loss in relationships and in relation to self - identifying life patterns, circumstances, changes that cause losses. Established group norm of speaking from own life experience. Group goal of establishing open and affirming space for members to share loss and experience with grief, normalize grief experience and provide psycho social education and grief support.    Kyle Mejia was alert and oriented x4, pulled from group for discussion with care team, he returned and was present throughout remainder of group.  He was engaged in discussion and participated in group discussion voluntarily.    Stated he was not sober when his mother died and he feels regret and guilt for this.  Described his mother as supportive and that she only wanted to see him sober.  He stated he feels the weight of this regret when he relapses.  Spoke with group about finding forgiveness for himself and was able to speak with group about his mother's and father's care for him as a motivating factor in his recovery.   Kyle Mejia connected with other group members around feeling distanced from self, identifying substance use as negative coping for feelings of loss. Identified that ETOH use has complicated his grief journey.   Identified his sisters as supportive of him.

## 2016-08-09 NOTE — Tx Team (Signed)
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 acetaminophen (TYLENOL) tablet 650 mg  650 mg Oral Q6H PRN Kerrie Buffalo, NP      . alum & mag hydroxide-simeth (MAALOX/MYLANTA) 200-200-20 MG/5ML suspension 30 mL  30 mL Oral Q4H PRN Kerrie Buffalo, NP      . hydrOXYzine (ATARAX/VISTARIL) tablet 25 mg  25 mg Oral Q6H PRN Kerrie Buffalo, NP      . Influenza vac split quadrivalent PF (FLUARIX) injection 0.5 mL  0.5 mL Intramuscular Tomorrow-1000 Fernando A Cobos, MD      . loperamide (IMODIUM) capsule 2-4 mg  2-4 mg Oral PRN Kerrie Buffalo, NP      . LORazepam (ATIVAN) tablet 1 mg  1 mg Oral Q6H PRN Kerrie Buffalo, NP      . LORazepam (ATIVAN) tablet 1 mg  1 mg Oral QID Kerrie Buffalo, NP   1 mg at 08/09/16 7614   Followed by  . [START ON 08/10/2016] LORazepam (ATIVAN) tablet 1 mg  1 mg Oral TID Kerrie Buffalo, NP       Followed by  . [START ON 08/11/2016] LORazepam (ATIVAN) tablet 1 mg  1 mg Oral BID Kerrie Buffalo, NP       Followed by  . [START ON 08/12/2016] LORazepam (ATIVAN) tablet 1 mg  1 mg Oral Daily Kerrie Buffalo, NP      . magnesium hydroxide (MILK OF MAGNESIA) suspension 30 mL  30 mL Oral Daily PRN Kerrie Buffalo, NP      . multivitamin with minerals tablet 1 tablet  1 tablet Oral Daily Kerrie Buffalo, NP   1 tablet at 08/09/16 0808  . naproxen (NAPROSYN) tablet 500 mg  500 mg Oral Q12H PRN Kerrie Buffalo, NP   500 mg at 08/09/16 0810  . nicotine polacrilex (NICORETTE) gum 2 mg  2 mg Oral PRN Ursula Alert, MD   2 mg at 08/09/16 0811  . ondansetron (ZOFRAN-ODT) disintegrating tablet 4 mg  4 mg  Oral Q6H PRN Kerrie Buffalo, NP      . thiamine (VITAMIN B-1) tablet 100 mg  100 mg Oral Daily Kerrie Buffalo, NP   100 mg at 08/09/16 7092  . traZODone (DESYREL) tablet 50 mg  50 mg Oral QHS PRN,MR X 1 Nanci Pina, FNP   50 mg at 08/08/16 2124   PTA Medications: Prescriptions Prior to Admission  Medication Sig Dispense Refill Last Dose  . DULoxetine (CYMBALTA) 20 MG capsule Take 1 capsule (20 mg total) by mouth at bedtime. For depression 30 capsule 1 ~a month ago  . Elbasvir-Grazoprevir (ZEPATIER) 50-100 MG TABS Take 1 tablet by mouth daily. (Patient not taking: Reported on 08/07/2016) 28 tablet 2 Not Taking at Unknown time  . folic acid (FOLVITE) 1 MG tablet Take 1 tablet (1 mg total) by mouth daily. (Patient not taking: Reported on 08/07/2016) 30 tablet 11 Not Taking at Unknown time  . gabapentin (NEURONTIN) 300 MG capsule Take 1 capsule (300 mg total) by mouth 3 (three) times daily. For agitation (Patient not taking: Reported on 08/07/2016) 90 capsule 1 Not Taking at Unknown time  . meloxicam (MOBIC) 7.5 MG tablet Take 1 tablet (7.5 mg total) by mouth  daily. (Patient not taking: Reported on 08/07/2016) 30 tablet 1 Not Taking at Unknown time  . naproxen (NAPROSYN) 500 MG tablet Take 1 tablet (500 mg total) by mouth 2 (two) times daily. (Patient not taking: Reported on 08/07/2016) 30 tablet 0 Not Taking at Unknown time  . thiamine (VITAMIN B-1) 100 MG tablet Take 1 tablet (100 mg total) by mouth daily. (Patient not taking: Reported on 08/07/2016) 30 tablet 11 Not Taking at Unknown time    Treatment Modalities: Medication Management, Group therapy, Case management,  1 to 1 session with clinician, Psychoeducation, Recreational therapy.   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  Therapeutic Interventions: 1 to 1 sessions, Unit Group sessions and Medication administration.  Evaluation of Outcomes: Not Met  Physician Treatment Plan for Secondary Diagnosis: Active Problems:   Alcohol use disorder ([place])  Long Term Goal(s): Improvement in symptoms so as ready for discharge  Short Term Goals: Ability to identify and develop effective coping behaviors will improve, Compliance with prescribed medications will improve and Ability to identify triggers associated with substance abuse/mental health issues will improve  Medication Management: Evaluate patient's response, side effects, and tolerance of medication regimen.  Therapeutic Interventions: 1 to 1 sessions, Unit Group sessions and Medication administration.  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 RN will report any adverse and/or side effects to prescribing provider.  Therapeutic Interventions: 1 on 1 counseling sessions,  Psychoeducation, Medication administration, Evaluate responses to treatment, Monitor vital signs and CBGs as ordered, Perform/monitor CIWA, COWS, AIMS and Fall Risk screenings as ordered, Perform wound care treatments as ordered.  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  Short Term Goals: Engage patient in aftercare planning with referrals and resources, Increase emotional regulation and Increase skills for wellness and recovery  Therapeutic Interventions: Assess for all discharge needs, 1 to 1 time with Social worker, Explore available resources and support systems, Assess for adequacy in community support network, Educate family and significant other(s) on suicide prevention, Complete Psychosocial Assessment, Interpersonal group therapy.  Evaluation of Outcomes: Not Met   Progress in Treatment :  Attending groups: Continuing to assess  Participating in groups: Continuing to assess  Taking medication as prescribed: Yes, MD continuing to assess for appropriate medication regimen  Toleration medication: Yes  Family/Significant other contact made: Treatment team assessing for appropriate contacts  Patient understands diagnosis: Yes  Discussing patient identified problems/goals with staff: Yes  Medical problems stabilized or resolved: Yes  Denies suicidal/homicidal ideation: Treatment team continuing to asses  Issues/concerns per patient self-inventory: None reported  Other: N/A  New problem(s) identified: None reported at this time    New Short Term/Long Term Goal(s): None at this time    Discharge Plan or Barriers: Treatment team continuing to assess.    Reason for  Continuation of Hospitalization: Anxiety Depression Medication stabilization Suicidal Ideations Withdrawal symptoms  Estimated Length of Stay: 3-5 days    Attendees:  Patient:   Physician: Dr. Parke Poisson, Dr. Shea Evans , MD  08/09/2016   9:30am  Nursing: Bosie Helper, RN  08/09/2016 9:30am  RN Care Manager: Lars Pinks, [place]  08/09/2016 9:30am  Social Workers: Peri Maris, LCSW, [place], LCSW, 08/09/2016 9:30am  Nurse Pratictioners: Ricky Ala, NP, Lindell Spar, NP 08/09/2016 9:30am  Other:  08/09/2016 9:30am    Scribe for Treatment Team: Tilden Fossa, [place] Worker [facility] [phone]

## 2016-08-09 NOTE — BHH Suicide Risk Assessment (Signed)
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 Major depressive disorder, recurrent episode ([place]) [F33.9] 08/08/2016  . 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 Arthritis of left acromioclavicular joint [M19.90] 06/09/2016  . 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 Varus deformity, not elsewhere classified, left knee [M21.162] 06/02/2016  . Carpal tunnel syndrome [G56.00] 06/02/2016  . Chronic hepatitis C without hepatic coma ([place]) [B18.2] 05/21/2016  . Tobacco dependence [F17.200] 05/19/2016  . Chronic left shoulder pain [M25.512, G89.29] 05/19/2016  . History of alcohol abuse [F10.10] 05/19/2016  . Loss of weight [R63.4] 05/19/2016  . Elevated blood pressure [R03.0] 05/19/2016  . Neuropathy ([place]) [G62.9] 05/19/2016  . Depression [F32.9] 05/19/2016  . Colon cancer screening [Z12.11] 05/19/2016  . Alcohol use disorder, severe, dependence ([place]) [F10.20] 03/19/2016   Subjective Data: Patient states " I am suicidal and I can figure out a plan . I need help with my alcohol abuse. I have tried to kill myself in the past by OD sing on pills."   Continued Clinical Symptoms:  Alcohol Use Disorder Identification Test Final Score (AUDIT): 30 The "Alcohol Use Disorders Identification Test", Guidelines for Use in Primary Care, Second Edition.  World Pharmacologist [facility]). Score between 0-7:  no or low risk or  alcohol related problems. Score between 8-15:  moderate risk of alcohol related problems. Score between 16-19:  high risk of alcohol related problems. Score 20 or above:  warrants further diagnostic evaluation for alcohol dependence and treatment.   CLINICAL FACTORS:   Depression:   Comorbid alcohol abuse/dependence Impulsivity Alcohol/Substance Abuse/Dependencies Chronic Pain   Musculoskeletal: Strength & Muscle Tone: within normal limits Gait & Station: normal Patient leans: N/A  Psychiatric Specialty Exam: Physical Exam  Nursing note and vitals reviewed. Constitutional:  I concur with PE done in ED    Review of Systems  Psychiatric/Behavioral: Positive for depression, substance abuse and suicidal ideas. The patient is nervous/anxious and has insomnia.   All other systems reviewed and are negative.   Blood pressure (!) 149/87, pulse 79, temperature 97.9 F (36.6 C), temperature source Oral, resp. rate 16, height 5\' 8"  (1.727 m), weight 81.6 kg (180 lb), SpO2 96 %.Body mass index is 27.37 kg/m.  General Appearance: Fairly Groomed  Eye Contact:  Fair  Speech:  Normal Rate  Volume:  Normal  Mood:  Dysphoric  Affect:  Depressed  Thought Process:  Goal Directed and Descriptions of Associations: Circumstantial  Orientation:  Full (Time, Place, and Person)  Thought Content:  Rumination  Suicidal Thoughts:  Yes.  with intent/plan  Homicidal Thoughts:  No  Memory:  Immediate;   Fair Recent;   Fair Remote;   Fair  Judgement:  Impaired  Insight:  Present  Psychomotor Activity:  Normal  Concentration:  Concentration: Fair and Attention Span: Fair  Recall:  [employer] of Knowledge:  Fair  Language:  Fair  Akathisia:  No  Handed:  Right  AIMS (if  indicated):     Assets:  Desire for Improvement  ADL's:  Intact  Cognition:  WNL  Sleep:  Number of Hours: 4.75      COGNITIVE FEATURES THAT CONTRIBUTE TO RISK:  Closed-mindedness, Polarized thinking and Thought  constriction (tunnel vision)    SUICIDE RISK:   Moderate:  Frequent suicidal ideation with limited intensity, and duration, some specificity in terms of plans, no associated intent, good self-control, limited dysphoria/symptomatology, some risk factors present, and identifiable protective factors, including available and accessible social support.   PLAN OF CARE:Will not restart Cymbalta due to ADRs . Will increase Gabapentin to 300 mg po tid for anxiety, pain.  Please see H&P.   I certify that inpatient services furnished can reasonably be expected to improve the patient's condition.  Aydden Cumpian, MD 08/09/2016, 12:23 PM

## 2016-08-09 NOTE — Progress Notes (Signed)
Patient ID: Kyle Mejia, male   DOB: 1956/08/18, 60 y.o.   MRN: [id number]  DAR: Pt. Denies SI/HI and A/V Hallucinations. He reports sleep is fair, appetite is good, energy level is low, and concentration is good. He rates depression 5/10, hopelessness 6/10, and anxiety 5/10. He reports withdrawal symptoms including irritability, cravings, agitation, and tremors. Patient does continue to report pain in left knee and is receiving PRN Naproxen. Support and encouragement provided to the patient. Scheduled medications administered to patient per physician's orders. Patient is minimal but cooperative. He is seen in the milieu and is participating in his plan of care. Q15 minute checks are maintained for safety.

## 2016-08-09 NOTE — BHH Suicide Risk Assessment (Addendum)
[place] INPATIENT:  Family/Significant Other Suicide Prevention Education  Suicide Prevention Education:  Education Completed; girlfriend Gasper Lloyd [phone],  (name of family member/significant other) has been identified by the patient as the family member/significant other with whom the patient will be residing, and identified as the person(s) who will aid the patient in the event of a mental health crisis (suicidal ideations/suicide attempt).  With written consent from the patient, the family member/significant other has been provided the following suicide prevention education, prior to the and/or following the discharge of the patient.  The suicide prevention education provided includes the following:  Suicide risk factors  Suicide prevention and interventions  National Suicide Hotline telephone number  [facility] assessment telephone number  [facility] Assistance [place] and/or Residential Mobile Crisis Unit telephone number  Request made of family/significant other to:  Remove weapons (e.g., guns, rifles, knives), all items previously/currently identified as safety concern.    Remove drugs/medications (over-the-counter, prescriptions, illicit drugs), all items previously/currently identified as a safety concern.  The family member/significant other verbalizes understanding of the suicide prevention education information provided.  The family member/significant other agrees to remove the items of safety concern listed above. Girlfriend expressed frustration over patient's relapse and reports her need to establish boundaries with the patient to take care of herself as she has medical issues. She states that he was living in a halfway house prior to admission and cannot return. She states that he cannot stay with her at discharge and she would like to see him in a long term program.   Erasmo Downer L Aara [place] 08/09/2016, 3:26 PM

## 2016-08-09 NOTE — BHH Counselor (Signed)
Adult Comprehensive Assessment  Patient ID: Kyle Mejia, male   DOB: [birth year], 60 y.o.   MRN: [id number]  Information Source: Information source: Patient  Current Stressors:  Educational / Learning stressors: Denies stressors Employment / Job issues: Unemployed for approximately 4 months- chronic knee pain gave him trouble doing job Family Relationships: reports a decent relationship with 2 sisters that live in [place] / Lack of resources (include bankruptcy): Financial stressors Housing / Lack of housing: Living in a house in [place] with his girlfriend Physical health (include injuries & life threatening diseases): chronic knee pain- reports that he has an upcoming surgery scheduled  Social relationships: Denies stressors Substance abuse: Daily alcohol use- drinking up to a quarter gallon of liquor in addition to several beers daily Bereavement / Loss: Denies stressors  Living/Environment/Situation:  Living Arrangements: with significant other Living conditions (as described by patient or guardian): Living in a house in [place] with his girlfriend How long has patient lived in current situation?: 6 months What is atmosphere in current home: Comfortable  Family History:  Marital status: Long term relationship Divorced, when?: over 10 years Long term relationship, how long?: 20 years What types of issues is patient dealing with in the relationship?: No contact with ex-wife; his drinking puts strain on relationship with girlfriend Are you sexually active?: Yes What is your sexual orientation?: Straight Has your sexual activity been affected by drugs, alcohol, medication, or emotional stress?: None Does patient have children?: No  Childhood History:  By whom was/is the patient raised?: Both parents Description of patient's relationship with caregiver when they were a child: Very good relationship with both growing up Patient's description of current  relationship with people who raised him/her: Both are deceased How were you disciplined when you got in trouble as a child/adolescent?: "Normal" grounding, spanking Does patient have siblings?: Yes Number of Siblings: 2 Description of patient's current relationship with siblings: Sisters - long distance - but good relationships, stay in touch Did patient suffer any verbal/emotional/physical/sexual abuse as a child?: No Did patient suffer from severe childhood neglect?: No Has patient ever been sexually abused/assaulted/raped as an adolescent or adult?: No Was the patient ever a victim of a crime or a disaster?: No Witnessed domestic violence?: No Has patient been effected by domestic violence as an adult?: No  Education:  Highest grade of school patient has completed: 12th grade Currently a student?: No Learning disability?: No  Employment/Work Situation:   Employment situation: Unemployed for approximately 4 moths Patient's job has been impacted by current illness: Yes Describe how patient's job has been impacted: Sometimes his knee is too painful to be able to do his job What is the longest time patient has a held a job?: 30 years 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 Has patient ever been in the [employer]?: No Are There Guns or Other Weapons in [place]?: No  Financial Resources:   Museum/gallery curator resources: Support from significant other Does patient have a Programmer, applications or guardian?: No  Alcohol/Substance Abuse:   What has been your use of drugs/alcohol within the last 12 months?: Daily alcohol use- drinking up to a quarter gallon of liquor in addition to several beers daily  Alcohol/Substance Abuse Treatment Hx: Past Tx, Inpatient, Attends AA/NA If yes, describe treatment: Rehab at [facility]; [place] in 2017, [facility] in 2014 Has alcohol/substance abuse ever caused legal problems?: Yes  Social Support System:   Patient's Community Support  System: Good Describe Community Support System:  Girlfriend, 2 sisters Type of faith/religion: None  Leisure/Recreation:   Leisure and Hobbies: Not much because of the knee pain - cannot be active  Strengths/Needs:   What things does the patient do well?: Play golf, draw, crafty In what areas does patient struggle / problems for patient: Pain, alcohol has gotten worse and hasn't been able to control it.  Feels alcohol is controlling him.  Discharge Plan:   Does patient have access to transportation?: Yes Will patient be returning to same living situation after discharge?: No- patient interested in residential treatment at [facility] or [facility] Currently receiving community mental health services: No If no, would patient like referral for services when discharged?: Yes (What county?) ([place] or surrounding counties) Does patient have financial barriers related to discharge medications?: Yes Patient description of barriers related to discharge medications: No insurance, does not know if he could afford it.  Summary/Recommendations:   Summary and Recommendations (to be completed by the evaluator): Patient is a 60yo male admitted to the hospital with SI and alcohol abuse. He reports primary trigger for admission was knee pain, employment/financial stressors, and increased depression. Patient will benefit from crisis stabilization, medication evaluation, group therapy and psychoeducation, in addition to case management for discharge planning. At discharge it is recommended that Patient adhere to the established discharge plan and continue in treatment.  Tilden Fossa, LCSW Clinical Social Worker [facility] [phone]

## 2016-08-09 NOTE — Progress Notes (Addendum)
Patient did not attend AA group tonight.

## 2016-08-09 NOTE — Tx Team (Incomplete)
Interdisciplinary Treatment and Diagnostic Plan Update  08/09/2016 Time of Session: 9:30am Kyle Mejia MRN: [id number]  Principal Diagnosis: Alcohol use disorder, severe, dependence ([place])  Secondary Diagnoses: Principal Problem:   Alcohol use disorder, severe, dependence ([place])   Current Medications:  No current facility-administered medications for this encounter.    No current outpatient prescriptions on file.   Facility-Administered Medications Ordered in Other Encounters  Medication Dose Route Frequency Provider Last Rate Last Dose  . acetaminophen (TYLENOL) tablet 650 mg  650 mg Oral Q6H PRN Kerrie Buffalo, NP      . alum & mag hydroxide-simeth (MAALOX/MYLANTA) 200-200-20 MG/5ML suspension 30 mL  30 mL Oral Q4H PRN Kerrie Buffalo, NP      . hydrOXYzine (ATARAX/VISTARIL) tablet 25 mg  25 mg Oral Q6H PRN Kerrie Buffalo, NP      . Influenza vac split quadrivalent PF (FLUARIX) injection 0.5 mL  0.5 mL Intramuscular Tomorrow-1000 Fernando A Cobos, MD      . loperamide (IMODIUM) capsule 2-4 mg  2-4 mg Oral PRN Kerrie Buffalo, NP      . LORazepam (ATIVAN) tablet 1 mg  1 mg Oral Q6H PRN Kerrie Buffalo, NP      . LORazepam (ATIVAN) tablet 1 mg  1 mg Oral QID Kerrie Buffalo, NP   1 mg at 08/09/16 [id number]   Followed by  . [START ON 08/10/2016] LORazepam (ATIVAN) tablet 1 mg  1 mg Oral TID Kerrie Buffalo, NP       Followed by  . [START ON 08/11/2016] LORazepam (ATIVAN) tablet 1 mg  1 mg Oral BID Kerrie Buffalo, NP       Followed by  . [START ON 08/12/2016] LORazepam (ATIVAN) tablet 1 mg  1 mg Oral Daily Kerrie Buffalo, NP      . magnesium hydroxide (MILK OF MAGNESIA) suspension 30 mL  30 mL Oral Daily PRN Kerrie Buffalo, NP      . multivitamin with minerals tablet 1 tablet  1 tablet Oral Daily Kerrie Buffalo, NP   1 tablet at 08/09/16 0808  . naproxen (NAPROSYN) tablet 500 mg  500 mg Oral Q12H PRN Kerrie Buffalo, NP   500 mg at 08/09/16 0810  . nicotine polacrilex (NICORETTE) gum 2 mg  2  mg Oral PRN Ursula Alert, MD   2 mg at 08/09/16 0811  . ondansetron (ZOFRAN-ODT) disintegrating tablet 4 mg  4 mg Oral Q6H PRN Kerrie Buffalo, NP      . thiamine (VITAMIN B-1) tablet 100 mg  100 mg Oral Daily Kerrie Buffalo, NP   100 mg at 08/09/16 [id number]  . traZODone (DESYREL) tablet 50 mg  50 mg Oral QHS PRN,MR X 1 Nanci Pina, FNP   50 mg at 08/08/16 2124   PTA Medications: Prescriptions Prior to Admission  Medication Sig Dispense Refill Last Dose  . DULoxetine (CYMBALTA) 20 MG capsule Take 1 capsule (20 mg total) by mouth at bedtime. For depression 30 capsule 1 ~a month ago  . Elbasvir-Grazoprevir (ZEPATIER) 50-100 MG TABS Take 1 tablet by mouth daily. (Patient not taking: Reported on 08/07/2016) 28 tablet 2 Not Taking at Unknown time  . folic acid (FOLVITE) 1 MG tablet Take 1 tablet (1 mg total) by mouth daily. (Patient not taking: Reported on 08/07/2016) 30 tablet 11 Not Taking at Unknown time  . gabapentin (NEURONTIN) 300 MG capsule Take 1 capsule (300 mg total) by mouth 3 (three) times daily. For agitation (Patient not taking: Reported on 08/07/2016) 90 capsule 1 Not Taking at  Unknown time  . meloxicam (MOBIC) 7.5 MG tablet Take 1 tablet (7.5 mg total) by mouth daily. (Patient not taking: Reported on 08/07/2016) 30 tablet 1 Not Taking at Unknown time  . naproxen (NAPROSYN) 500 MG tablet Take 1 tablet (500 mg total) by mouth 2 (two) times daily. (Patient not taking: Reported on 08/07/2016) 30 tablet 0 Not Taking at Unknown time  . thiamine (VITAMIN B-1) 100 MG tablet Take 1 tablet (100 mg total) by mouth daily. (Patient not taking: Reported on 08/07/2016) 30 tablet 11 Not Taking at Unknown time    Treatment Modalities: Medication Management, Group therapy, Case management,  1 to 1 session with clinician, Psychoeducation, Recreational therapy.   Physician Treatment Plan for Primary Diagnosis: Alcohol use disorder, severe, dependence ([place]) Long Term Goal(s): Improvement in symptoms so as  ready for discharge   Short Term Goals: Ability to demonstrate self-control will improve, Ability to identify and develop effective coping behaviors will improve and Compliance with prescribed medications will improve  Medication Management: Evaluate patient's response, side effects, and tolerance of medication regimen.  Therapeutic Interventions: 1 to 1 sessions, Unit Group sessions and Medication administration.  Evaluation of Outcomes: Progressing   RN Treatment Plan for Primary Diagnosis: Alcohol use disorder, severe, dependence ([place]) Long Term Goal(s): Knowledge of disease and therapeutic regimen to maintain health will improve  Short Term Goals: Ability to demonstrate self-control, Ability to disclose and discuss suicidal ideas, Ability to identify and develop effective coping behaviors will improve and Compliance with prescribed medications will improve  Medication Management: RN will administer medications as ordered by provider, will assess and evaluate patient's response and provide education to patient for prescribed medication. RN will report any adverse and/or side effects to prescribing provider.  Therapeutic Interventions: 1 on 1 counseling sessions, Psychoeducation, Medication administration, Evaluate responses to treatment, Monitor vital signs and CBGs as ordered, Perform/monitor CIWA, COWS, AIMS and Fall Risk screenings as ordered, Perform wound care treatments as ordered.  Evaluation of Outcomes: Progressing   LCSW Treatment Plan for Primary Diagnosis: Alcohol use disorder, severe, dependence ([place]) Long Term Goal(s): Safe transition to appropriate next level of care at discharge, Engage patient in therapeutic group addressing interpersonal concerns.  Short Term Goals: Engage patient in aftercare planning with referrals and resources, Identify triggers associated with mental health/substance abuse issues and Increase skills for wellness and recovery  Therapeutic  Interventions: Assess for all discharge needs, 1 to 1 time with Social worker, Explore available resources and support systems, Assess for adequacy in community support network, Educate family and significant other(s) on suicide prevention, Complete Psychosocial Assessment, Interpersonal group therapy.  Evaluation of Outcomes: Progressing   Progress in Treatment :  Attending groups: Continuing to assess  Participating in groups: Continuing to assess  Taking medication as prescribed: Yes, MD continuing to assess for appropriate medication regimen  Toleration medication: Yes  Family/Significant other contact made: Treatment team assessing for appropriate contacts  Patient understands diagnosis: Yes  Discussing patient identified problems/goals with staff: Yes  Medical problems stabilized or resolved: Yes  Denies suicidal/homicidal ideation: Treatment team continuing to asses  Issues/concerns per patient self-inventory: None reported  Other: N/A  New problem(s) identified: None reported at this time    New Short Term/Long Term Goal(s): None at this time    Discharge Plan or Barriers: Treatment team continuing to assess.    Reason for Continuation of Hospitalization: Anxiety Depression Medication stabilization Suicidal Ideations Withdrawal symptoms  Estimated Length of Stay: 3-5 days    Attendees:  Patient:   Physician: Dr. , MD  08/09/2016   9:30am  Nursing: 08/09/2016 9:30am  RN Care Manager: Lars Pinks, CM  08/09/2016 9:30am  Social Workers: Peri Maris, LCSW, Tilden Fossa, LCSW, [place], LCSW   08/09/2016 9:30am  Nurse Pratictioners: Samuel Jester, NP, Lindell Spar, NP 08/09/2016 9:30am  Other:  08/09/2016 9:30am    Scribe for Treatment Team: Tilden Fossa, Reading Worker [facility] [phone]

## 2016-08-10 DIAGNOSIS — Z79899 Other long term (current) drug therapy: Secondary | ICD-10-CM

## 2016-08-10 DIAGNOSIS — Z8249 Family history of ischemic heart disease and other diseases of the circulatory system: Secondary | ICD-10-CM

## 2016-08-10 DIAGNOSIS — Z811 Family history of alcohol abuse and dependence: Secondary | ICD-10-CM

## 2016-08-10 DIAGNOSIS — F17210 Nicotine dependence, cigarettes, uncomplicated: Secondary | ICD-10-CM

## 2016-08-10 MED ORDER — CITALOPRAM HYDROBROMIDE 20 MG PO TABS
20.0000 mg | ORAL_TABLET | Freq: Every day | ORAL | Status: DC
  Administered 2016-08-10 – 2016-08-14 (×5): 20 mg via ORAL
  Filled 2016-08-10 (×6): qty 1
  Filled 2016-08-10 (×2): qty 21

## 2016-08-10 NOTE — [facility] Group Notes (Signed)
[place] LCSW Group Therapy 08/10/2016 1:15 PM Type of Therapy: Group Therapy Participation Level: Active  Participation Quality: Attentive  Affect: Blunted  Cognitive: Alert and Oriented  Insight: Developing/Improving and Engaged  Engagement in Therapy: Developing/Improving and Engaged  Modes of Intervention: Activity, Clarification, Confrontation, Discussion, Education, Exploration, Limit-setting, Orientation, Problem-solving, Rapport Building, Art therapist, Socialization and Support  Summary of Progress/Problems: Patient was attentive and engaged with speaker from [place]. Patient was attentive to speaker while they shared their story of dealing with mental health and overcoming it. Patient expressed interest in their programs and services and received information on their agency. Patient processed ways they can relate to the speaker.   Tilden Fossa, LCSW Clinical Social Worker [facility] [phone]

## 2016-08-10 NOTE — Progress Notes (Signed)
Patient ID: Kyle Mejia, male   DOB: 17-Mar-1956, 60 y.o.   MRN: [id number]   Patient's BP elevated this evening. Writer spoke to patient 1:1 about this. He denies withdrawal symptoms. He is asymptomatic at this time and is steady on his feet.

## 2016-08-10 NOTE — Progress Notes (Signed)
Referrals pending at [facility] as of 9/18. No beds expected at [facility] for the remainder of the week per [facility] (admissions coordinator).   Tilden Fossa, LCSW Clinical Social Worker [facility] [phone]

## 2016-08-10 NOTE — Progress Notes (Signed)
Patient ID: Kyle Mejia, male   DOB: 1956-09-03, 60 y.o.   MRN: [id number]  DAR: Pt. Denies SI/HI and A/V Hallucinations. He reports sleep is good, appetite is good, energy level is normal, and concentration is good. He rates depression 3/10 and anxiety 2/10. Patient does continue to report left knee pain and is receiving PRN Naprosyn. He reports Gabapentin helps as well. Support and encouragement provided to the patient. Scheduled medications administered to patient per physician's orders. Patient denies withdrawal symptoms today. He continues the Ativan protocol. Patient is minimal but cooperative. He is seen sleeping more throughout the day than yesterday. He is attending some groups. Q15 minute checks are maintained for safety.

## 2016-08-10 NOTE — Progress Notes (Signed)
Patient did not attend wrap-up group was sleeping.

## 2016-08-10 NOTE — Progress Notes (Signed)
[facility] MD Progress Note  08/10/2016 9:27 AM Kyle Mejia  MRN:  [id number] Subjective:  "better now" mood "pretty good" Had d/ced Cymbalta secondary to nausea and ineffectiveness (was actively drinking) and would like trial of other agent for depression. After discussion of risks, benefits,side effects, alternatives including none, rationale, dosing patient agrees to trial of Celexa 20 mg po qhs. 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 Major depressive disorder, recurrent episode ([place]) [F33.9] 08/08/2016  . 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 Arthritis of left acromioclavicular joint [M19.90] 06/09/2016  . 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 Varus deformity, not elsewhere classified, left knee [M21.162] 06/02/2016  . Carpal tunnel syndrome [G56.00] 06/02/2016  . Chronic hepatitis C without hepatic coma ([place]) [B18.2] 05/21/2016  . Tobacco dependence [F17.200] 05/19/2016  . Chronic left shoulder pain [M25.512, G89.29] 05/19/2016  . History of alcohol abuse [F10.10] 05/19/2016  . Loss of weight [R63.4] 05/19/2016  . Elevated blood pressure [R03.0] 05/19/2016  . Neuropathy ([place]) [G62.9] 05/19/2016  . Depression [F32.9] 05/19/2016  . Colon cancer screening [Z12.11] 05/19/2016  . Alcohol use disorder, severe, dependence ([place]) [F10.20] 03/19/2016   Total Time spent with patient: 15 minutes  Past Psychiatric History: see H and P  Past Medical History:  Past Medical History:  Diagnosis Date  . Chronic left shoulder pain   . Chronic pain of left knee   . History of alcoholism ([place])   . Tobacco dependence     Past Surgical  History:  Procedure Laterality Date  . KNEE RECONSTRUCTION     left knee    Family History:  Family History  Problem Relation Age of Onset  . Heart disease Mother   . Alcoholism Paternal Uncle    Family Psychiatric  History: see H&P Social History:  History  Alcohol Use  . Yes    Comment: 1 pint of liquor and multiple beers     History  Drug Use No    Social History   Social History  . Marital status: Single    Spouse name: N/A  . Number of children: N/A  . Years of education: N/A   Social History Main Topics  . Smoking status: Current Every Day Smoker    Packs/day: 0.50    Types: Cigarettes  . Smokeless tobacco: Never Used     Comment: 1/2 pk per day.   . Alcohol use Yes     Comment: 1 pint of liquor and multiple beers  . Drug use: No  . Sexual activity: Yes    Birth control/ protection: Condom   Other Topics Concern  . None   Social History Narrative  . None   Additional Social History:                         Sleep: Fair  Appetite:  Fair  Current Medications: Current Facility-Administered Medications  Medication Dose Route Frequency Provider Last Rate Last Dose  . acetaminophen (TYLENOL) tablet 650 mg  650 mg Oral Q6H PRN Kerrie Buffalo, NP      . alum & mag hydroxide-simeth (MAALOX/MYLANTA) 200-200-20 MG/5ML suspension 30 mL  30 mL Oral Q4H PRN Kerrie Buffalo, NP      .  citalopram (CELEXA) tablet 20 mg  20 mg Oral QHS Linard Millers, MD      . gabapentin (NEURONTIN) capsule 300 mg  300 mg Oral TID Ursula Alert, MD   300 mg at 08/10/16 [id number]  . hydrOXYzine (ATARAX/VISTARIL) tablet 25 mg  25 mg Oral Q6H PRN Kerrie Buffalo, NP      . loperamide (IMODIUM) capsule 2-4 mg  2-4 mg Oral PRN Kerrie Buffalo, NP      . LORazepam (ATIVAN) tablet 1 mg  1 mg Oral Q6H PRN Kerrie Buffalo, NP      . LORazepam (ATIVAN) tablet 1 mg  1 mg Oral TID Kerrie Buffalo, NP   1 mg at 08/10/16 [id number]   Followed by  . [START ON 08/11/2016] LORazepam (ATIVAN)  tablet 1 mg  1 mg Oral BID Kerrie Buffalo, NP       Followed by  . [START ON 08/12/2016] LORazepam (ATIVAN) tablet 1 mg  1 mg Oral Daily Kerrie Buffalo, NP      . magnesium hydroxide (MILK OF MAGNESIA) suspension 30 mL  30 mL Oral Daily PRN Kerrie Buffalo, NP      . multivitamin with minerals tablet 1 tablet  1 tablet Oral Daily Kerrie Buffalo, NP   1 tablet at 08/10/16 0808  . naproxen (NAPROSYN) tablet 500 mg  500 mg Oral Q12H PRN Kerrie Buffalo, NP   500 mg at 08/10/16 0809  . nicotine polacrilex (NICORETTE) gum 2 mg  2 mg Oral PRN Ursula Alert, MD   2 mg at 08/09/16 1121  . ondansetron (ZOFRAN-ODT) disintegrating tablet 4 mg  4 mg Oral Q6H PRN Kerrie Buffalo, NP      . thiamine (VITAMIN B-1) tablet 100 mg  100 mg Oral Daily Kerrie Buffalo, NP   100 mg at 08/10/16 0808  . traZODone (DESYREL) tablet 50 mg  50 mg Oral QHS Encarnacion Slates, NP        Lab Results: No results found for this or any previous visit (from the past 48 hour(s)).  Blood Alcohol level:  Lab Results  Component Value Date   ETH 91 (H) 08/07/2016   ETH 273 (H) [id number]    Metabolic Disorder Labs: Lab Results  Component Value Date   HGBA1C 5.0 05/19/2016   MPG 97 05/19/2016   No results found for: PROLACTIN Lab Results  Component Value Date   CHOL 133 05/19/2016   TRIG 73 05/19/2016   HDL 39 (L) 05/19/2016   CHOLHDL 3.4 05/19/2016   VLDL 15 05/19/2016   LDLCALC 79 05/19/2016   Vitals:   08/10/16 0611 08/10/16 0612  BP: (!) 146/88 139/82  Pulse: 66 79  Resp: 18   Temp: 97.2 F (36.2 C)    Physical Findings: AIMS: Facial and Oral Movements Muscles of Facial Expression: None, normal Lips and Perioral Area: None, normal Jaw: None, normal Tongue: None, normal,Extremity Movements Upper (arms, wrists, hands, fingers): None, normal Lower (legs, knees, ankles, toes): None, normal, Trunk Movements Neck, shoulders, hips: None, normal, Overall Severity Severity of abnormal movements (highest score from  questions above): None, normal Incapacitation due to abnormal movements: None, normal Patient's awareness of abnormal movements (rate only patient's report): No Awareness, Dental Status Current problems with teeth and/or dentures?: No Does patient usually wear dentures?: No  CIWA:  CIWA-Ar Total: 0 COWS:     Musculoskeletal: Strength & Muscle Tone: not tested Gait & Station: normal Patient leans: N/A  Psychiatric Specialty Exam: Physical Exam  Vitals reviewed. Constitutional: He appears  well-developed.    Review of Systems  All other systems reviewed and are negative.   Blood pressure 139/82, pulse 79, temperature 97.2 F (36.2 C), resp. rate 18, height 5\' 8"  (1.727 m), weight 81.6 kg (180 lb), SpO2 96 %.Body mass index is 27.37 kg/m.  General Appearance: Neat  Eye Contact:  Good  Speech:  Normal Rate  Volume:  Normal  Mood:  Euphoric  Affect:  Appropriate  Thought Process:  Goal Directed  Orientation:  Full (Time, Place, and Person)  Thought Content:  Negative  Suicidal Thoughts:  No  Homicidal Thoughts:  No  Memory:  Negative  Judgement:  Fair  Insight:  Fair  Psychomotor Activity:  Normal  Concentration:  Concentration: Good  Recall:  Good  Fund of Knowledge:  Good  Language:  Good  Akathisia:  No  Handed:  Right  AIMS (if indicated):     Assets:  Desire for Improvement  ADL's:  Intact  Cognition:  WNL  Sleep:  Number of Hours: 6.5     Treatment Plan Summary: Daily contact with patient to assess and evaluate symptoms and progress in treatment and Medication management  Hazle Coca, MD 08/10/2016, 9:27 AM

## 2016-08-10 NOTE — [facility] Group Notes (Signed)
[place] Group Notes:  (Nursing/MHT/Case Management/Adjunct)  Date:  08/10/2016  Time:  9:48 AM  Type of Therapy:  Psychoeducational Skills  Participation Level:  Minimal  Participation Quality:  Appropriate  Affect:  Blunted  Cognitive:  Appropriate  Insight:  Appropriate  Engagement in Group:  Engaged  Modes of Intervention:  Discussion and Education  Summary of Progress/Problems: [place] attended group but came into group late. He was appropriate during group and engaged although he did not speak. He would nod his head intermittently when writer was speaking.   Gaylan Gerold E 08/10/2016, 9:48 AM

## 2016-08-11 MED ORDER — TRAZODONE HCL 50 MG PO TABS
50.0000 mg | ORAL_TABLET | Freq: Every evening | ORAL | Status: DC | PRN
  Administered 2016-08-11 – 2016-08-14 (×4): 50 mg via ORAL
  Filled 2016-08-11 (×3): qty 1
  Filled 2016-08-11: qty 21

## 2016-08-11 NOTE — Progress Notes (Signed)
Patient ID: Kyle Mejia, male   DOB: 1956/03/18, 60 y.o.   MRN: [id number] D: Client in bed this shift, "I been real sleepy, tired during the day" Client up for snacks, walking slow, reported stiffness in left leg from old injury. A: Writer provided emotional support. Reviewed medications, administered as ordered. Staff will monitor q70min for safety. R: Client is safe on the unit, did not attend group.

## 2016-08-11 NOTE — Progress Notes (Signed)
[facility] MD Progress Note  08/11/2016 12:20 PM Kyle Mejia  MRN:  [id number] Subjective: mood is "good" withdrawal symptoms "tapering off" with no significant symptoms now. Tolerating taper off ativan well. 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 Major depressive disorder, recurrent episode ([place]) [F33.9] 08/08/2016  . 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 Arthritis of left acromioclavicular joint [M19.90] 06/09/2016  . 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 Varus deformity, not elsewhere classified, left knee [M21.162] 06/02/2016  . Carpal tunnel syndrome [G56.00] 06/02/2016  . Chronic hepatitis C without hepatic coma ([place]) [B18.2] 05/21/2016  . Tobacco dependence [F17.200] 05/19/2016  . Chronic left shoulder pain [M25.512, G89.29] 05/19/2016  . History of alcohol abuse [F10.10] 05/19/2016  . Loss of weight [R63.4] 05/19/2016  . Elevated blood pressure [R03.0] 05/19/2016  . Neuropathy ([place]) [G62.9] 05/19/2016  . Depression [F32.9] 05/19/2016  . Colon cancer screening [Z12.11] 05/19/2016  . Alcohol use disorder, severe, dependence ([place]) [F10.20] 03/19/2016   Total Time spent with patient: 20 minutes  Past Psychiatric History:no new  Past Medical History:  Past Medical History:  Diagnosis Date  . Chronic left shoulder pain   . Chronic pain of left knee   . History of alcoholism ([place])   . Tobacco dependence     Past Surgical History:  Procedure Laterality Date  . KNEE RECONSTRUCTION     left knee    Family History:  Family History  Problem  Relation Age of Onset  . Heart disease Mother   . Alcoholism Paternal Uncle    Family Psychiatric  History: no new Social History:  History  Alcohol Use  . Yes    Comment: 1 pint of liquor and multiple beers     History  Drug Use No    Social History   Social History  . Marital status: Single    Spouse name: N/A  . Number of children: N/A  . Years of education: N/A   Social History Main Topics  . Smoking status: Current Every Day Smoker    Packs/day: 0.50    Types: Cigarettes  . Smokeless tobacco: Never Used     Comment: 1/2 pk per day.   . Alcohol use Yes     Comment: 1 pint of liquor and multiple beers  . Drug use: No  . Sexual activity: Yes    Birth control/ protection: Condom   Other Topics Concern  . None   Social History Narrative  . None   Additional Social History:                         Sleep: Good  Appetite:  Good  Current Medications: Current Facility-Administered Medications  Medication Dose Route Frequency Provider Last Rate Last Dose  . acetaminophen (TYLENOL) tablet 650 mg  650 mg Oral Q6H PRN Kerrie Buffalo, NP      . alum & mag hydroxide-simeth (MAALOX/MYLANTA) 200-200-20 MG/5ML suspension 30 mL  30 mL Oral Q4H PRN Kerrie Buffalo, NP      . citalopram (CELEXA) tablet 20 mg  20 mg Oral QHS Linard Millers, MD  20 mg at 08/10/16 2220  . gabapentin (NEURONTIN) capsule 300 mg  300 mg Oral TID Ursula Alert, MD   300 mg at 08/11/16 1201  . hydrOXYzine (ATARAX/VISTARIL) tablet 25 mg  25 mg Oral Q6H PRN Kerrie Buffalo, NP      . loperamide (IMODIUM) capsule 2-4 mg  2-4 mg Oral PRN Kerrie Buffalo, NP      . LORazepam (ATIVAN) tablet 1 mg  1 mg Oral Q6H PRN Kerrie Buffalo, NP      . LORazepam (ATIVAN) tablet 1 mg  1 mg Oral BID Kerrie Buffalo, NP   1 mg at 08/11/16 [id number]   Followed by  . [START ON 08/12/2016] LORazepam (ATIVAN) tablet 1 mg  1 mg Oral Daily Kerrie Buffalo, NP      . magnesium hydroxide (MILK OF MAGNESIA) suspension  30 mL  30 mL Oral Daily PRN Kerrie Buffalo, NP      . multivitamin with minerals tablet 1 tablet  1 tablet Oral Daily Kerrie Buffalo, NP   1 tablet at 08/11/16 0811  . naproxen (NAPROSYN) tablet 500 mg  500 mg Oral Q12H PRN Kerrie Buffalo, NP   500 mg at 08/10/16 0809  . nicotine polacrilex (NICORETTE) gum 2 mg  2 mg Oral PRN Ursula Alert, MD   2 mg at 08/09/16 1121  . ondansetron (ZOFRAN-ODT) disintegrating tablet 4 mg  4 mg Oral Q6H PRN Kerrie Buffalo, NP      . thiamine (VITAMIN B-1) tablet 100 mg  100 mg Oral Daily Kerrie Buffalo, NP   100 mg at 08/11/16 0811  . traZODone (DESYREL) tablet 50 mg  50 mg Oral QHS PRN Linard Millers, MD        Lab Results: No results found for this or any previous visit (from the past 48 hour(s)).  Blood Alcohol level:  Lab Results  Component Value Date   ETH 91 (H) 08/07/2016   ETH 273 (H) [id number]    Metabolic Disorder Labs: Lab Results  Component Value Date   HGBA1C 5.0 05/19/2016   MPG 97 05/19/2016   No results found for: PROLACTIN Lab Results  Component Value Date   CHOL 133 05/19/2016   TRIG 73 05/19/2016   HDL 39 (L) 05/19/2016   CHOLHDL 3.4 05/19/2016   VLDL 15 05/19/2016   LDLCALC 79 05/19/2016   Vitals:   08/11/16 0631 08/11/16 1216  BP: 126/90 (!) 150/83  Pulse: 76 68  Resp:  16  Temp:      Physical Findings: AIMS: Facial and Oral Movements Muscles of Facial Expression: None, normal Lips and Perioral Area: None, normal Jaw: None, normal Tongue: None, normal,Extremity Movements Upper (arms, wrists, hands, fingers): None, normal Lower (legs, knees, ankles, toes): None, normal, Trunk Movements Neck, shoulders, hips: None, normal, Overall Severity Severity of abnormal movements (highest score from questions above): None, normal Incapacitation due to abnormal movements: None, normal Patient's awareness of abnormal movements (rate only patient's report): No Awareness, Dental Status Current problems with teeth  and/or dentures?: No Does patient usually wear dentures?: No  CIWA:  CIWA-Ar Total: 0 COWS:     Musculoskeletal: Strength & Muscle Tone: within normal limits Gait & Station: normal Patient leans: N/A  Psychiatric Specialty Exam: Physical Exam  Constitutional: He is oriented to person, place, and time. He appears well-developed and well-nourished.  HENT:  Head: Normocephalic and atraumatic.  Eyes: Conjunctivae and EOM are normal. Pupils are equal, round, and reactive to light.  Neck: Normal range of motion.  Respiratory:  Effort normal.  Musculoskeletal: Normal range of motion.  Neurological: He is alert and oriented to person, place, and time.  Skin: Skin is warm and dry.  Psychiatric: He has a normal mood and affect. His behavior is normal. Judgment and thought content normal.    Review of Systems  All other systems reviewed and are negative.   Blood pressure (!) 150/83, pulse 68, temperature 97.6 F (36.4 C), temperature source Oral, resp. rate 16, height 5\' 8"  (1.727 m), weight 81.6 kg (180 lb), SpO2 96 %.Body mass index is 27.37 kg/m.  General Appearance: Well Groomed  Eye Contact:  Good  Speech:  Normal Rate  Volume:  Normal  Mood:  Euthymic  Affect:  Appropriate  Thought Process:  Goal Directed  Orientation:  Full (Time, Place, and Person)  Thought Content:  Negative  Suicidal Thoughts:  No  Homicidal Thoughts:  No  Memory:  Immediate;   Good  Judgement:  Fair  Insight:  Fair  Psychomotor Activity:  Negative  Concentration:  Concentration: Good  Recall:  Good  Fund of Knowledge:  Good  Language:  Good  Akathisia:  Negative  Handed:  Right  AIMS (if indicated):     Assets:  Communication Skills Desire for Improvement  ADL's:  Intact  Cognition:  WNL  Sleep:  Number of Hours: 6.5     Treatment Plan Summary: Plan Continue gabapentin pt states helpful for knee pain. Continue planned taper off ativan for detoxification from alcohol. Plan is to have patient  to transition as closely as possible to residential substance use treatment center, as gap in care would increase risk of relapse and psychiatric deterioration.  Linard Millers, MD 08/11/2016, 12:20 PM

## 2016-08-11 NOTE — Progress Notes (Signed)
Adult Psychoeducational Group Note  Date:  08/11/2016 Time:  10:54 AM  Group Topic/Focus:  Coping With Mental Health Crisis:   The purpose of this group is to help patients identify strategies for coping with mental health crisis.  Group discusses possible causes of crisis and ways to manage them effectively.   Participation Level:  Did Not Attend   Additional Comments: Pt did not attend group. Pt remained in bed during group. Abe People Brittini 08/11/2016, 10:54 AM

## 2016-08-11 NOTE — Progress Notes (Signed)
Recreation Therapy Notes  Date: 08/11/16 Time: 0930 Location: 300 Hall Group Room  Group Topic: Stress Management  Goal Area(s) Addresses:  Patient will verbalize importance of using healthy stress management.  Patient will identify positive emotions associated with healthy stress management.   Intervention: Stress Management  Activity :  Latina Craver Imagery.  LRT introduced the technique of guided imagery to patients.  Patients were to follow along as LRT read script.  LRT read script so patients could participate in the technique.  Education:  Stress Management, Discharge Planning.   Education Outcome: Needs additional education  Clinical Observations/Feedback: Pt did not attend group.   Victorino Sparrow, LRT/CTRS         Victorino Sparrow A 08/11/2016 12:27 PM

## 2016-08-11 NOTE — Progress Notes (Addendum)
Patient attended NA group meeting. 

## 2016-08-11 NOTE — Progress Notes (Signed)
D: Patient is sleeping and eating well.  He denies any withdrawal symptoms.  He denies any thoughts of self harm.  He rates his depression and anxiety as a 2.  He has minimal interaction with his peers and staff.  His goal is to "find treatment."   A: Continue to monitor medication management and MD orders.  Safety checks checks continued every 15 minutes per protocol.  Offer support and encouragement as needed. R: Patient is receptive to staff; his behavior is appropriate.

## 2016-08-11 NOTE — BHH Group Notes (Signed)
[place] LCSW Group Therapy 08/11/2016  1:15 PM   Type of Therapy: Group Therapy  Participation Level: Did Not Attend. Patient invited to participate but declined.   Tilden Fossa, MSW, [place] Clinical Social Worker [facility] [phone]

## 2016-08-11 NOTE — BHH Group Notes (Signed)
[facility] LCSW Aftercare Discharge Planning Group Note  08/11/2016  8:45 AM  Participation Quality: Did Not Attend. Patient invited to participate but declined.  Tilden Fossa, MSW, [place] Clinical Social Worker [facility] [phone]

## 2016-08-12 NOTE — Progress Notes (Signed)
Patient ID: Kyle Mejia, male   DOB: 12/01/1955, 60 y.o.   MRN: [id number] D: Client in bed early part of shift. "I don't know what it is, I'm just so tired" A: Writer encouraged client to get out of bed and participate in group. Medications reviewed, administered as ordered. Staff will monitor q37min for safety. R: client is safe on the unit, attended karaoke, "I enjoyed it"

## 2016-08-12 NOTE — BHH Group Notes (Signed)
[place] LCSW Group Therapy 08/12/2016  1:15 pm   Type of Therapy: Group Therapy Participation Level: Active  Participation Quality: Attentive, Sharing and Supportive  Affect: Appropriate  Cognitive: Alert and Oriented  Insight: Developing/Improving and Engaged  Engagement in Therapy: Developing/Improving and Engaged  Modes of Intervention: Clarification, Confrontation, Discussion, Education, Exploration, Limit-setting, Orientation, Problem-solving, Rapport Building, Art therapist, Socialization and Support  Summary of Progress/Problems: The topic for group was balance in life. Today's group focused on defining balance in one's own words, identifying things that can knock one off balance, and exploring healthy ways to maintain balance in life. Group members were asked to provide an example of a time when they felt off balance, describe how they handled that situation,and process healthier ways to regain balance in the future. Group members were asked to share the most important tool for maintaining balance that they learned while at [facility] and how they plan to apply this method after discharge. Patient discussed the difficulty of incorporating positive things into his life while trying to keep his distance from negative stressors. He provided support and advice to another group member who is struggling with setting boundaries and finding healthy emotional supports.  Tilden Fossa, MSW, [place] Clinical Social Worker [facility] [phone]

## 2016-08-12 NOTE — BHH Group Notes (Signed)
[place] Group Notes:  (Nursing/MHT/Case Management/Adjunct)  Date:  08/12/2016  Time:  10:45 AM  Type of Therapy:  Psychoeducational Skills  Participation Level:  Did Not Attend  Participation Quality:  N/A  Affect:  N/A  Cognitive:  N/A  Insight:  None  Engagement in Group:  None  Modes of Intervention:  Discussion and Education  Summary of Progress/Problems: Patient was invited to group but did not attend.   Margaretmary Bayley, Brevyn Ring E 08/12/2016, 10:45 AM

## 2016-08-12 NOTE — Progress Notes (Signed)
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 Major depressive disorder, recurrent episode ([place]) [F33.9] 08/08/2016  . 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 Arthritis of left acromioclavicular joint [M19.90] 06/09/2016  . 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 Varus deformity, not elsewhere classified, left knee [M21.162] 06/02/2016  . Carpal tunnel syndrome [G56.00] 06/02/2016  . Chronic hepatitis C without hepatic coma ([place]) [B18.2] 05/21/2016  . Tobacco dependence [F17.200] 05/19/2016  . Chronic left shoulder pain [M25.512, G89.29] 05/19/2016  . History of alcohol abuse [F10.10] 05/19/2016  . Loss of weight [R63.4] 05/19/2016  . Elevated blood pressure [R03.0] 05/19/2016  . Neuropathy ([place]) [G62.9] 05/19/2016  . Depression [F32.9] 05/19/2016  . Colon cancer screening [Z12.11] 05/19/2016  . Alcohol use disorder, severe, dependence ([place]) [F10.20] 03/19/2016   Total Time spent with patient: 15 minutes  Past Psychiatric History: no change  Past Medical History:  Past Medical History:  Diagnosis Date  . Chronic left shoulder pain   . Chronic pain of left knee   . History of alcoholism ([place])   . Tobacco dependence     Past Surgical History:  Procedure Laterality Date  . KNEE RECONSTRUCTION     left knee    Family History:  Family History  Problem Relation Age of Onset  . Heart disease Mother   . Alcoholism Paternal Uncle    Family Psychiatric  History: no change Social History:  History  Alcohol Use  . Yes    Comment:  1 pint of liquor and multiple beers     History  Drug Use No    Social History   Social History  . Marital status: Single    Spouse name: N/A  . Number of children: N/A  . Years of education: N/A   Social History Main Topics  . Smoking status: Current Every Day Smoker    Packs/day: 0.50    Types: Cigarettes  . Smokeless tobacco: Never Used     Comment: 1/2 pk per day.   . Alcohol use Yes     Comment: 1 pint of liquor and multiple beers  . Drug use: No  . Sexual activity: Yes    Birth control/ protection: Condom   Other Topics Concern  . None   Social History Narrative  . None   Additional Social History: none                        Sleep: Good  Appetite:  Good  Current Medications: Current Facility-Administered Medications  Medication Dose Route Frequency Provider Last Rate Last Dose  . acetaminophen (TYLENOL) tablet 650 mg  650 mg Oral Q6H PRN Kerrie Buffalo, NP      . alum & mag hydroxide-simeth (MAALOX/MYLANTA) 200-200-20 MG/5ML suspension 30 mL  30 mL Oral Q4H PRN Kerrie Buffalo, NP      . citalopram (CELEXA) tablet 20 mg  20 mg Oral QHS Linard Millers, MD   20 mg at 08/11/16 2206  . gabapentin (NEURONTIN) capsule 300 mg  300 mg Oral TID Ursula Alert, MD   300 mg at 08/12/16 1211  .  magnesium hydroxide (MILK OF MAGNESIA) suspension 30 mL  30 mL Oral Daily PRN Kerrie Buffalo, NP      . multivitamin with minerals tablet 1 tablet  1 tablet Oral Daily Kerrie Buffalo, NP   1 tablet at 08/12/16 0840  . naproxen (NAPROSYN) tablet 500 mg  500 mg Oral Q12H PRN Kerrie Buffalo, NP   500 mg at 08/10/16 0809  . nicotine polacrilex (NICORETTE) gum 2 mg  2 mg Oral PRN Ursula Alert, MD   2 mg at 08/09/16 1121  . thiamine (VITAMIN B-1) tablet 100 mg  100 mg Oral Daily Kerrie Buffalo, NP   100 mg at 08/12/16 0840  . traZODone (DESYREL) tablet 50 mg  50 mg Oral QHS PRN Linard Millers, MD   50 mg at 08/11/16 2206   Vitals:   08/12/16 0621 08/12/16 0622   BP: 136/74 126/74  Pulse: 70 81  Resp: 18   Temp: 98.1 F (36.7 C)    Lab Results: No results found for this or any previous visit (from the past 48 hour(s)).  Blood Alcohol level:  Lab Results  Component Value Date   ETH 91 (H) 08/07/2016   ETH 273 (H) [id number]    Metabolic Disorder Labs: Lab Results  Component Value Date   HGBA1C 5.0 05/19/2016   MPG 97 05/19/2016   No results found for: PROLACTIN Lab Results  Component Value Date   CHOL 133 05/19/2016   TRIG 73 05/19/2016   HDL 39 (L) 05/19/2016   CHOLHDL 3.4 05/19/2016   VLDL 15 05/19/2016   LDLCALC 79 05/19/2016    Physical Findings: AIMS: Facial and Oral Movements Muscles of Facial Expression: None, normal Lips and Perioral Area: None, normal Jaw: None, normal Tongue: None, normal,Extremity Movements Upper (arms, wrists, hands, fingers): None, normal Lower (legs, knees, ankles, toes): None, normal, Trunk Movements Neck, shoulders, hips: None, normal, Overall Severity Severity of abnormal movements (highest score from questions above): None, normal Incapacitation due to abnormal movements: None, normal Patient's awareness of abnormal movements (rate only patient's report): No Awareness, Dental Status Current problems with teeth and/or dentures?: No Does patient usually wear dentures?: No  CIWA:  CIWA-Ar Total: 0 COWS:     Musculoskeletal: Strength & Muscle Tone: within normal limits Gait & Station: stiff Patient leans: N/A  Psychiatric Specialty Exam: Physical Exam  Constitutional: He appears well-developed and well-nourished.  HENT:  Head: Normocephalic and atraumatic.  Eyes: Conjunctivae and EOM are normal. Pupils are equal, round, and reactive to light.  Neck: Normal range of motion.  Respiratory: Effort normal.  Psychiatric: He has a normal mood and affect. His behavior is normal.    Review of Systems  Musculoskeletal: Positive for joint pain.    Blood pressure 126/74, pulse 81,  temperature 98.1 F (36.7 C), temperature source Oral, resp. rate 18, height 5\' 8"  (1.727 m), weight 81.6 kg (180 lb), SpO2 96 %.Body mass index is 27.37 kg/m.  General Appearance: Casual  Eye Contact:  Good  Speech:  Normal Rate  Volume:  Normal  Mood:  Euthymic  Affect:  Appropriate  Thought Process:  Goal Directed  Orientation:  Full (Time, Place, and Person)  Thought Content:  Negative  Suicidal Thoughts:  No  Homicidal Thoughts:  No  Memory:  Immediate;   Good Recent;   Good Remote;   Good  Judgement:  Fair  Insight:  Fair  Psychomotor Activity:  Normal  Concentration:  Concentration: Good and Attention Span: Good  Recall:  Good  Fund of Knowledge:  Good  Language:  Good  Akathisia:  No  Handed:  Right  AIMS (if indicated):     Assets:  Desire for Improvement Resilience  ADL's:  Intact  Cognition:  WNL  Sleep:  Number of Hours: 6     Treatment Plan Summary: Daily contact with patient to assess and evaluate symptoms and progress in treatment, Medication management and Plan Kyle Mejia reports withdrawal symptoms are resolving and he is looking forward to attending a rehab program. Manson Allan remains good, no SI/HI. Plan is to d/c Sunday to stay overnight with friend prior to going to rehab if present course continues.  Linard Millers, MD 08/12/2016, 1:01 PM

## 2016-08-12 NOTE — Progress Notes (Signed)
D: Patient has been isolative to room today.  He has been attending some groups with minimal participation.  Patient rates his depression as a 4; hopelessness and anxiety as 3.  He denies any thoughts of self harm.  He is sleeping and eating well.  He denies any withdrawal symptoms.  He as tapered off the ativan protocol.  His goal is to "work on ways of recovery."   A: Continue to monitor medication management and MD orders.  Safety checks completed every 15 minutes per protocol.  Offer support and encouragement as needed. R: Patient has minimal interaction with staff.

## 2016-08-13 MED ORDER — LISINOPRIL 10 MG PO TABS
10.0000 mg | ORAL_TABLET | Freq: Once | ORAL | Status: AC
  Administered 2016-08-13: 10 mg via ORAL
  Filled 2016-08-13: qty 1

## 2016-08-13 MED ORDER — LISINOPRIL 5 MG PO TABS
5.0000 mg | ORAL_TABLET | Freq: Every day | ORAL | Status: DC
  Administered 2016-08-14 – 2016-08-15 (×2): 5 mg via ORAL
  Filled 2016-08-13 (×2): qty 1
  Filled 2016-08-13 (×2): qty 21
  Filled 2016-08-13: qty 1

## 2016-08-13 NOTE — BHH Group Notes (Signed)
Patient attend group.

## 2016-08-13 NOTE — Plan of Care (Signed)
Problem: Medication: Goal: Compliance with prescribed medication regimen will improve Outcome: Progressing Client is compliant with medications AEB reporting effectiveness of medications and denial of side effects. "I be so sleepy, maybe it's the medications"

## 2016-08-13 NOTE — Progress Notes (Signed)
Patient ID: Kyle Mejia, male   DOB: 1956/09/23, 60 y.o.   MRN: [id number] D: Client visible on the unit, reports "sleeping a lot" Client seen up tonight on this shift in dayroom watching TV, interacts appropriately with peers and staff. A: Writer provided emotional support, encouraged client to take a part in group activities during the day so he will have a more productive night sleep. Medications reviewed, administered as ordered. Staff will monitor q63min for safety. R: Client is safe on the unit, attended group.

## 2016-08-13 NOTE — Progress Notes (Signed)
[facility] MD Progress Note  08/13/2016 11:13 AM Kyle Mejia  MRN:  [id number] Subjective:  Mood is improved "not 100%" but better than on admission and "pretty good." Denies SI/HI. Looking forward to starting long term substance use program next week, and no problem with Sunday d/c. 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 Major depressive disorder, recurrent episode ([place]) [F33.9] 08/08/2016  . 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 Arthritis of left acromioclavicular joint [M19.90] 06/09/2016  . 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 Varus deformity, not elsewhere classified, left knee [M21.162] 06/02/2016  . Carpal tunnel syndrome [G56.00] 06/02/2016  . Chronic hepatitis C without hepatic coma ([place]) [B18.2] 05/21/2016  . Tobacco dependence [F17.200] 05/19/2016  . Chronic left shoulder pain [M25.512, G89.29] 05/19/2016  . History of alcohol abuse [F10.10] 05/19/2016  . Loss of weight [R63.4] 05/19/2016  . Elevated blood pressure [R03.0] 05/19/2016  . Neuropathy ([place]) [G62.9] 05/19/2016  . Depression [F32.9] 05/19/2016  . Colon cancer screening [Z12.11] 05/19/2016  . Alcohol use disorder, severe, dependence ([place]) [F10.20] 03/19/2016   Total Time spent with patient: 15 minutes  Past Psychiatric History: no change  Past Medical History:  Past Medical History:  Diagnosis Date  . Chronic left shoulder pain   . Chronic pain of left knee   . History of alcoholism ([place])   . Tobacco dependence     Past Surgical History:  Procedure Laterality Date  . KNEE RECONSTRUCTION     left knee    Family History:  Family History  Problem  Relation Age of Onset  . Heart disease Mother   . Alcoholism Paternal Uncle    Family Psychiatric  History: no change Social History:  History  Alcohol Use  . Yes    Comment: 1 pint of liquor and multiple beers     History  Drug Use No    Social History   Social History  . Marital status: Single    Spouse name: N/A  . Number of children: N/A  . Years of education: N/A   Social History Main Topics  . Smoking status: Current Every Day Smoker    Packs/day: 0.50    Types: Cigarettes  . Smokeless tobacco: Never Used     Comment: 1/2 pk per day.   . Alcohol use Yes     Comment: 1 pint of liquor and multiple beers  . Drug use: No  . Sexual activity: Yes    Birth control/ protection: Condom   Other Topics Concern  . None   Social History Narrative  . None   Additional Social History: n/a                        Sleep: Good  Appetite:  Good  Current Medications: Current Facility-Administered Medications  Medication Dose Route Frequency Provider Last Rate Last Dose  . acetaminophen (TYLENOL) tablet 650 mg  650 mg Oral Q6H PRN Kerrie Buffalo, NP      . alum & mag hydroxide-simeth (MAALOX/MYLANTA) 200-200-20 MG/5ML suspension 30 mL  30 mL Oral Q4H PRN Kerrie Buffalo, NP      . citalopram (CELEXA) tablet 20 mg  20 mg Oral QHS Hazle Coca  Sharolyn Douglas, MD   20 mg at 08/12/16 2205  . gabapentin (NEURONTIN) capsule 300 mg  300 mg Oral TID Ursula Alert, MD   300 mg at 08/13/16 0752  . lisinopril (PRINIVIL,ZESTRIL) tablet 10 mg  10 mg Oral Once Encarnacion Slates, NP      . Derrill Memo ON 08/14/2016] lisinopril (PRINIVIL,ZESTRIL) tablet 5 mg  5 mg Oral Daily Encarnacion Slates, NP      . magnesium hydroxide (MILK OF MAGNESIA) suspension 30 mL  30 mL Oral Daily PRN Kerrie Buffalo, NP      . multivitamin with minerals tablet 1 tablet  1 tablet Oral Daily Kerrie Buffalo, NP   1 tablet at 08/13/16 0752  . naproxen (NAPROSYN) tablet 500 mg  500 mg Oral Q12H PRN Kerrie Buffalo, NP   500 mg  at 08/10/16 0809  . nicotine polacrilex (NICORETTE) gum 2 mg  2 mg Oral PRN Ursula Alert, MD   2 mg at 08/09/16 1121  . traZODone (DESYREL) tablet 50 mg  50 mg Oral QHS PRN Linard Millers, MD   50 mg at 08/12/16 2205    Lab Results: No results found for this or any previous visit (from the past 36 hour(s)).  Blood Alcohol level:  Lab Results  Component Value Date   ETH 91 (H) 08/07/2016   ETH 273 (H) [id number]    Metabolic Disorder Labs: Lab Results  Component Value Date   HGBA1C 5.0 05/19/2016   MPG 97 05/19/2016   No results found for: PROLACTIN Lab Results  Component Value Date   CHOL 133 05/19/2016   TRIG 73 05/19/2016   HDL 39 (L) 05/19/2016   CHOLHDL 3.4 05/19/2016   VLDL 15 05/19/2016   LDLCALC 79 05/19/2016   Vitals:   08/13/16 0645 08/13/16 0646  BP: (!) 158/87 (!) 152/83  Pulse: 61 73  Resp: 18   Temp: 97.8 F (36.6 C)    Physical Findings: AIMS: Facial and Oral Movements Muscles of Facial Expression: None, normal Lips and Perioral Area: None, normal Jaw: None, normal Tongue: None, normal,Extremity Movements Upper (arms, wrists, hands, fingers): None, normal Lower (legs, knees, ankles, toes): None, normal, Trunk Movements Neck, shoulders, hips: None, normal, Overall Severity Severity of abnormal movements (highest score from questions above): None, normal Incapacitation due to abnormal movements: None, normal Patient's awareness of abnormal movements (rate only patient's report): No Awareness, Dental Status Current problems with teeth and/or dentures?: No Does patient usually wear dentures?: No  CIWA:  CIWA-Ar Total: 0 COWS:     Musculoskeletal: Strength & Muscle Tone: within normal limits Gait & Station: stiff Patient leans: N/A  Psychiatric Specialty Exam: Physical Exam  Constitutional: He is oriented to person, place, and time. He appears well-developed and well-nourished.  HENT:  Head: Normocephalic and atraumatic.  Eyes: Pupils  are equal, round, and reactive to light.  Neck: Normal range of motion.  Respiratory: Effort normal.  Neurological: He is alert and oriented to person, place, and time.  Psychiatric: His behavior is normal.    Review of Systems  Musculoskeletal: Positive for joint pain.    Blood pressure (!) 152/83, pulse 73, temperature 97.8 F (36.6 C), temperature source Oral, resp. rate 18, height 5\' 8"  (1.727 m), weight 81.6 kg (180 lb), SpO2 96 %.Body mass index is 27.37 kg/m.  General Appearance: Casual  Eye Contact:  Good  Speech:  Normal Rate  Volume:  Normal  Mood:  Euthymic  Affect:  Congruent  Thought Process:  Goal Directed and  Linear  Orientation:  Full (Time, Place, and Person)  Thought Content:  Negative  Suicidal Thoughts:  No  Homicidal Thoughts:  No  Memory:  Immediate;   Good Recent;   Good Remote;   Good  Judgement:  Fair  Insight:  Fair  Psychomotor Activity:  stiff gait  Concentration:  Concentration: Good  Recall:  Good  Fund of Knowledge:  Good  Language:  Good  Akathisia:  No  Handed:  Right  AIMS (if indicated):     Assets:  Communication Skills Desire for Improvement Resilience  ADL's:  Intact  Cognition:  WNL  Sleep:  Number of Hours: 6.25     Treatment Plan Summary: Daily contact with patient to assess and evaluate symptoms and progress in treatment, Medication management and pt plans to attend long term substance treatment next Monday.  Linard Millers, MD 08/13/2016, 11:13 AM

## 2016-08-13 NOTE — Progress Notes (Signed)
Recreation Therapy Notes  Date: 08/13/16 Time: 0930 Location: 300 Hall Group Room  Group Topic: Stress Management  Goal Area(s) Addresses:  Patient will verbalize importance of using healthy stress management.  Patient will identify positive emotions associated with healthy stress management.   Behavioral Response: Engaged  Intervention: Stress Management  Activity :  [facility].  LRT introduced the concept of guided imagery.  LRT read a script to allow patients to participate in activity.  Patients were to follow along as LRT read script.  Education:  Stress Management, Discharge Planning.   Education Outcome: Acknowledges edcuation/In group clarification offered/Needs additional education  Clinical Observations/Feedback:  Pt attended group.   Victorino Sparrow, LRT/CTRS     Victorino Sparrow A 08/13/2016 12:36 PM

## 2016-08-13 NOTE — Progress Notes (Signed)
Patient ID: Kyle Mejia, male   DOB: 05/23/1956, 60 y.o.   MRN: [id number]  DAR: Pt. Denies SI/HI and A/V Hallucinations. He reports sleep is good, appetite is good, energy level is low, and concentration is good. He rates depression 3/10 and hopelessness 0/10. Patient does not report any pain or discomfort at this time. Support and encouragement provided to the patient. Scheduled medications administered to patient per physician's orders. He does continue to report feeling sleepy throughout the day and consequently spends most of his day in bed. Q15 minute checks are maintained for safety.

## 2016-08-13 NOTE — Tx Team (Signed)
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 acetaminophen (TYLENOL) tablet 650 mg  650 mg Oral Q6H PRN Kerrie Buffalo, NP      . alum & mag hydroxide-simeth (MAALOX/MYLANTA) 200-200-20 MG/5ML suspension 30 mL  30 mL Oral Q4H PRN Kerrie Buffalo, NP      . citalopram (CELEXA) tablet 20 mg  20 mg Oral QHS Linard Millers, MD   20 mg at 08/12/16 2205  . gabapentin (NEURONTIN) capsule 300 mg  300 mg Oral TID Ursula Alert, MD   300 mg at 08/13/16 0752  . lisinopril (PRINIVIL,ZESTRIL) tablet 10 mg  10 mg Oral Once Encarnacion Slates, NP      . Derrill Memo ON 08/14/2016] lisinopril (PRINIVIL,ZESTRIL) tablet 5 mg  5 mg Oral Daily Encarnacion Slates, NP      . magnesium hydroxide (MILK OF MAGNESIA) suspension 30 mL  30 mL Oral Daily PRN Kerrie Buffalo, NP      . multivitamin with minerals tablet 1 tablet  1 tablet Oral Daily Kerrie Buffalo, NP   1 tablet at 08/13/16 0752  . naproxen (NAPROSYN) tablet 500 mg  500 mg Oral Q12H PRN Kerrie Buffalo, NP   500 mg at 08/10/16 0809  . nicotine polacrilex (NICORETTE) gum 2 mg  2 mg Oral PRN Ursula Alert, MD   2 mg at 08/09/16 1121  . thiamine (VITAMIN B-1) tablet 100 mg  100 mg Oral Daily Kerrie Buffalo, NP   100 mg at 08/13/16 0752  . traZODone (DESYREL) tablet 50 mg  50 mg Oral QHS PRN Linard Millers, MD   50 mg at 08/12/16 2205   PTA Medications: Prescriptions Prior to  Admission  Medication Sig Dispense Refill Last Dose  . DULoxetine (CYMBALTA) 20 MG capsule Take 1 capsule (20 mg total) by mouth at bedtime. For depression 30 capsule 1 ~a month ago  . Elbasvir-Grazoprevir (ZEPATIER) 50-100 MG TABS Take 1 tablet by mouth daily. (Patient not taking: Reported on 08/07/2016) 28 tablet 2 Not Taking at Unknown time  . folic acid (FOLVITE) 1 MG tablet Take 1 tablet (1 mg total) by mouth daily. (Patient not taking: Reported on 08/07/2016) 30 tablet 11 Not Taking at Unknown time  . gabapentin (NEURONTIN) 300 MG capsule Take 1 capsule (300 mg total) by mouth 3 (three) times daily. For agitation (Patient not taking: Reported on 08/07/2016) 90 capsule 1 Not Taking at Unknown time  . meloxicam (MOBIC) 7.5 MG tablet Take 1 tablet (7.5 mg total) by mouth daily. (Patient not taking: Reported on 08/07/2016) 30 tablet 1 Not Taking at Unknown time  . naproxen (NAPROSYN) 500 MG tablet Take 1 tablet (500 mg total) by mouth 2 (two) times daily. (Patient not taking: Reported on 08/07/2016) 30 tablet 0 Not Taking at Unknown time  . thiamine (VITAMIN B-1) 100 MG tablet Take 1 tablet (100 mg total) by mouth daily. (Patient not taking: Reported on 08/07/2016) 30 tablet 11 Not Taking at Unknown time    Treatment Modalities: Medication Management, Group therapy, Case  management,  1 to 1 session with clinician, Psychoeducation, Recreational therapy.   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  Therapeutic Interventions: 1 to 1 sessions, Unit Group sessions and Medication administration.  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  Therapeutic Interventions: 1 to 1 sessions, Unit Group sessions and Medication administration.  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 RN will report any adverse and/or side effects to prescribing provider.  Therapeutic Interventions: 1 on 1 counseling sessions, Psychoeducation, Medication administration, Evaluate responses to  treatment, Monitor vital signs and CBGs as ordered, Perform/monitor CIWA, COWS, AIMS and Fall Risk screenings as ordered, Perform wound care treatments as ordered.  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  Short Term Goals: Engage patient in aftercare planning with referrals and resources, Increase emotional regulation and Increase skills for wellness and recovery  Therapeutic Interventions: Assess for all discharge needs, 1 to 1 time with Social worker, Explore available resources and support systems, Assess for adequacy in community support network, Educate family and significant other(s) on suicide prevention, Complete Psychosocial Assessment, Interpersonal group therapy.  Evaluation of Outcomes: Adequate for Discharge   Progress in Treatment :  Attending groups: Intermittently  Participating in groups: Minimally  Taking medication as prescribed: Yes, MD continuing to assess for appropriate medication regimen  Toleration medication: Yes  Family/Significant other contact made: Yes, CSW has spoken with friend  Patient understands diagnosis: Yes  Discussing patient identified problems/goals with staff: Yes  Medical problems stabilized or resolved: Yes  Denies suicidal/homicidal ideation: Yes, denies  Issues/concerns per patient self-inventory: None reported  Other: N/A  New problem(s) identified: None reported at this time    [place]  Short Term/Long Term Goal(s): None at this time    Discharge Plan or Barriers: Patient plans to stay with friend at discharge to await his [facility] screening on 08/16/16    Reason for Continuation of  Hospitalization: Anxiety Depression Medication stabilization Suicidal Ideations Withdrawal symptoms  Estimated Length of Stay: Discharge anticipated for 08/15/16   Attendees:  Patient:  Physician: Dr. Harolyn Rutherford 08/13/2016 9:30am  Nursing: Clement Husbands RN 08/13/2016 9:30am  RN Care Manager:   Social Workers: Peri Maris, LCSW, [place], LCSW, 08/13/2016 9:30am  Nurse Pratictioners: Larose Kells, NP 08/13/2016 9:30am   Scribe for Treatment Team: Tilden Fossa, [place] Worker [facility] [phone]

## 2016-08-14 MED ORDER — GABAPENTIN 300 MG PO CAPS
300.0000 mg | ORAL_CAPSULE | Freq: Four times a day (QID) | ORAL | Status: DC
  Administered 2016-08-14 – 2016-08-15 (×2): 300 mg via ORAL
  Filled 2016-08-14 (×2): qty 84
  Filled 2016-08-14 (×3): qty 1
  Filled 2016-08-14 (×2): qty 84
  Filled 2016-08-14 (×2): qty 1
  Filled 2016-08-14: qty 84
  Filled 2016-08-14 (×2): qty 1
  Filled 2016-08-14 (×3): qty 84

## 2016-08-14 NOTE — Progress Notes (Signed)
[facility] MD Progress Note  08/14/2016 11:03 AM Kyle Mejia  MRN:  [id number] Subjective:  Patient reports ' I am feeling good, I have a screening for [facility] today."  Objective:Kyle Mejia is awake, alert and oriented *4. Seen resting in bedroom.  Denies suicidal or homicidal ideation. Denies auditory or visual hallucination and does not appear to be responding to internal stimuli.   Patient reports he is medication compliant without mediation side effects. Report attending group sessions. States his depression 2/10. Patient states " I am feeling ready to discharge on tomorrow " patient reports good appetite and reports  resting well on most nights. Patient report he is excited regarding discharge. Support, encouragement and reassurance was provided.   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 Major depressive disorder, recurrent episode ([place]) [F33.9] 08/08/2016  . 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 Arthritis of left acromioclavicular joint [M19.90] 06/09/2016  . 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 Varus deformity, not elsewhere classified, left knee [M21.162] 06/02/2016  . Carpal tunnel syndrome [G56.00] 06/02/2016  . Chronic hepatitis C without hepatic coma ([place]) [B18.2] 05/21/2016  . Tobacco dependence [F17.200] 05/19/2016  . Chronic left shoulder pain [M25.512, G89.29] 05/19/2016  . History of alcohol abuse [F10.10] 05/19/2016  . Loss of weight [R63.4] 05/19/2016  . Elevated blood pressure [R03.0] 05/19/2016  . Neuropathy ([place]) [G62.9] 05/19/2016  . Depression [F32.9] 05/19/2016  . Colon cancer screening [Z12.11]  05/19/2016  . Alcohol use disorder, severe, dependence ([place]) [F10.20] 03/19/2016   Total Time spent with patient: 15 minutes  Past Psychiatric History: no change  Past Medical History:  Past Medical History:  Diagnosis Date  . Chronic left shoulder pain   . Chronic pain of left knee   . History of alcoholism ([place])   . Tobacco dependence     Past Surgical History:  Procedure Laterality Date  . KNEE RECONSTRUCTION     left knee    Family History:  Family History  Problem Relation Age of Onset  . Heart disease Mother   . Alcoholism Paternal Uncle    Family Psychiatric  History: no change Social History:  History  Alcohol Use  . Yes    Comment: 1 pint of liquor and multiple beers     History  Drug Use No    Social History   Social History  . Marital status: Single    Spouse name: N/A  . Number of children: N/A  . Years of education: N/A   Social History Main Topics  . Smoking status: Current Every Day Smoker    Packs/day: 0.50    Types: Cigarettes  . Smokeless tobacco: Never Used     Comment: 1/2 pk per day.   . Alcohol use Yes     Comment: 1 pint of liquor and multiple beers  . Drug use: No  . Sexual activity: Yes    Birth control/ protection: Condom   Other Topics Concern  . None   Social History Narrative  . None   Additional Social History: n/a                        Sleep: Good  Appetite:  Good  Current Medications: Current Facility-Administered Medications  Medication Dose Route Frequency Provider Last Rate Last Dose  . acetaminophen (TYLENOL) tablet 650 mg  650 mg Oral Q6H PRN Kerrie Buffalo, NP      . alum & mag hydroxide-simeth (MAALOX/MYLANTA) 200-200-20 MG/5ML suspension 30 mL  30 mL Oral Q4H PRN Kerrie Buffalo, NP      . citalopram (CELEXA) tablet 20 mg  20 mg Oral QHS Linard Millers, MD   20 mg at 08/13/16 2150  . gabapentin (NEURONTIN) capsule 300 mg  300 mg Oral TID Ursula Alert, MD   300 mg at 08/14/16 0800  .  lisinopril (PRINIVIL,ZESTRIL) tablet 5 mg  5 mg Oral Daily Encarnacion Slates, NP   5 mg at 08/14/16 0800  . magnesium hydroxide (MILK OF MAGNESIA) suspension 30 mL  30 mL Oral Daily PRN Kerrie Buffalo, NP      . multivitamin with minerals tablet 1 tablet  1 tablet Oral Daily Kerrie Buffalo, NP   1 tablet at 08/14/16 0800  . naproxen (NAPROSYN) tablet 500 mg  500 mg Oral Q12H PRN Kerrie Buffalo, NP   500 mg at 08/10/16 0809  . nicotine polacrilex (NICORETTE) gum 2 mg  2 mg Oral PRN Ursula Alert, MD   2 mg at 08/09/16 1121  . traZODone (DESYREL) tablet 50 mg  50 mg Oral QHS PRN Linard Millers, MD   50 mg at 08/13/16 2150    Lab Results: No results found for this or any previous visit (from the past 48 hour(s)).  Blood Alcohol level:  Lab Results  Component Value Date   ETH 91 (H) 08/07/2016   ETH 273 (H) [id number]    Metabolic Disorder Labs: Lab Results  Component Value Date   HGBA1C 5.0 05/19/2016   MPG 97 05/19/2016   No results found for: PROLACTIN Lab Results  Component Value Date   CHOL 133 05/19/2016   TRIG 73 05/19/2016   HDL 39 (L) 05/19/2016   CHOLHDL 3.4 05/19/2016   VLDL 15 05/19/2016   LDLCALC 79 05/19/2016   Vitals:   08/14/16 0628 08/14/16 0629  BP: 135/65 (!) 111/57  Pulse: 61 73  Resp: 18   Temp: 97.9 F (36.6 C)    Physical Findings: AIMS: Facial and Oral Movements Muscles of Facial Expression: None, normal Lips and Perioral Area: None, normal Jaw: None, normal Tongue: None, normal,Extremity Movements Upper (arms, wrists, hands, fingers): None, normal Lower (legs, knees, ankles, toes): None, normal, Trunk Movements Neck, shoulders, hips: None, normal, Overall Severity Severity of abnormal movements (highest score from questions above): None, normal Incapacitation due to abnormal movements: None, normal Patient's awareness of abnormal movements (rate only patient's report): No Awareness, Dental Status Current problems with teeth and/or  dentures?: No Does patient usually wear dentures?: No  CIWA:  CIWA-Ar Total: 0 COWS:     Musculoskeletal: Strength & Muscle Tone: within normal limits Gait & Station: stiff Patient leans: N/A  Psychiatric Specialty Exam: Physical Exam  Constitutional: He is oriented to person, place, and time. He appears well-developed and well-nourished.  HENT:  Head: Normocephalic and atraumatic.  Eyes: Pupils are equal, round, and reactive to light.  Neck: Normal range of motion.  Respiratory: Effort normal.  Neurological: He is alert and oriented to person, place, and time.  Psychiatric: He has a normal mood and affect. His behavior is normal.    Review of Systems  Psychiatric/Behavioral: Positive for substance abuse. Negative for hallucinations and suicidal ideas. Depression: stable. Nervous/anxious:  stable.     Blood pressure (!) 111/57, pulse 73, temperature 97.9 F (36.6 C), resp. rate 18, height 5\' 8"  (1.727 m), weight 81.6 kg (180 lb), SpO2 98 %.Body mass index is 27.37 kg/m.  General Appearance: Casual and Fairly Groomed  Eye Contact:  Good  Speech:  Normal Rate  Volume:  Normal  Mood:  Euthymic  Affect:  Congruent  Thought Process:  Goal Directed and Linear  Orientation:  Full (Time, Place, and Person)  Thought Content:  Negative  Suicidal Thoughts:  No  Homicidal Thoughts:  No  Memory:  Immediate;   Good Recent;   Good Remote;   Good  Judgement:  Fair  Insight:  Fair  Psychomotor Activity:  stiff gait  Concentration:  Concentration: Good  Recall:  Good  Fund of Knowledge:  Good  Language:  Good  Akathisia:  No  Handed:  Right  AIMS (if indicated):     Assets:  Communication Skills Desire for Improvement Resilience  ADL's:  Intact  Cognition:  WNL  Sleep:  Number of Hours: 6     I agree with current treatment plan on 08/14/2016, Patient seen face-to-face for psychiatric evaluation follow-up, chart reviewed. Reviewed the information documented and agree with the  treatment plan.  Treatment Plan Summary: Daily contact with patient to assess and evaluate symptoms and progress in treatment, Medication management and pt plans to attend long term substance treatment next Monday.   Continue with Celexa 20 mg, Neurontin 300 mg  for mood stabilization. Continue with Trazodone 50 mg for insomnia Will continue to monitor vitals ,medication compliance and treatment side effects while patient is here.  Reviewed labs Glucose 104 elevated ,BAL +91 , UDS postive benzodizpines. CSW will start working on disposition.  Patient to participate in therapeutic milieu   Derrill Center, NP 08/14/2016, 11:03 AM

## 2016-08-14 NOTE — Progress Notes (Signed)
Kyle Mejia reported to Probation officer that he didn't sleep well last night and is requesting to have another Neurontin at bedtime as well.  Staffed with Ricky Ala NP and order obtained for the increase.

## 2016-08-14 NOTE — Progress Notes (Signed)
  Patient ID: Kyle Mejia, male   DOB: Dec 06, 1955, 60 y.o.   MRN: [id number] D: Patient complains of poor sleep, however, he is sleeping during the day.  He has been encouraged to stay up during the daytime and attend groups.  He rates his depression as 3; hopelessness as a 2; anxiety as 5.  He denies any thoughts of self harm.  He denies AVH/HI.  He has brighter affect today.  He is awaiting a screening interview for [facility] with a possible admission.   A: Continue to monitor medication management and MD orders.  Safety checks completed every 15 minutes per protocol.  Offer support and encouragement as needed. R: Patient is receptive to staff; his behavior is appropriate.

## 2016-08-14 NOTE — BHH Group Notes (Signed)
[place] Group Notes:  (Nursing/MHT/Case Management/Adjunct)  Date:  08/14/2016  Time:  1:30pm   Type of Therapy:  Nurse Education  Participation Level:  Active  Participation Quality:  Appropriate, Attentive, Sharing and Supportive  Affect:  Appropriate  Cognitive:  Alert and Appropriate  Insight:  Improving  Engagement in Group:  Engaged  Modes of Intervention:  Discussion and Education  Summary of Progress/Problems:  Group topic today was Psychiatrist.  He was very supportive during group.  He talked about his struggles with staying sober.  His current stressors are no employment and being homeless.    Manuella Ghazi RN, BSN 08/14/16

## 2016-08-14 NOTE — BHH Group Notes (Signed)
[place] LCSW Group Note  Group session was about Understanding Change and Initiating Change.  Participation - Present.  Began the discussion by having group participants identify Changes they can Control and then Changes they Can't Control. Then revisited with the group the list of Changes that can be Controlled to identify if the Changes are Hard or Easy to control. Particularly focused the idea of Changing on Thinking, Personality and Actions which all participants identified as Hard to change.   1- Gained understanding of the inevitability of Change. 2- Assessed each Change individually. 3- Understand the importance of change or Rewards of change. 4- Identifying one small step toward big Change.  Patient progress - Patient shared as much of the group did that people and relationships make it difficult to be successful with recovery.   Christene Lye MSW, LCSW

## 2016-08-14 NOTE — Progress Notes (Signed)
D.  Pt pleasant on approach, denies complaints at this time.  Pleased to have night time dose of Neurontin.  Pt was positive for evening AA group, interacting appropriately with peers on the unit.  Pt denies SI/HI/hallucinations at this time.  A.  Support and encouragement offered, medication given as ordered  R.  Pt remains safe on the unit, will continue to monitor.

## 2016-08-14 NOTE — BHH Group Notes (Signed)
Late entry from 9/22:       [facility] LCSW Group Therapy 08/13/16 1:15 PM Type of Therapy: Group Therapy Participation Level: Minimal  Participation Quality: Attentive  Affect: Appropriate  Cognitive: Alert and Oriented  Insight: Developing/Improving and Engaged  Engagement in Therapy: Developing/Improving and Engaged  Modes of Intervention: Clarification, Confrontation, Discussion, Education, Exploration, Limit-setting, Orientation, Problem-solving, Rapport Building, Art therapist, Socialization and Support  Summary of Progress/Problems: The topic for today was establishing healthy boundaries in relationships. Pt processed their feelings related to boundary setting and was able to relate to peers. Pt discussed skills that can be used for establishing healthy boundaries and how boundaries relate to emotional wellness.    Tilden Fossa, MSW, Outlook Clinical Social Worker [facility] [phone]

## 2016-08-14 NOTE — Progress Notes (Signed)
Adult Psychoeducational Group Note  Date:  08/14/2016 Time:  9:44 PM  Group Topic/Focus:  Wrap-Up Group:   The focus of this group is to help patients review their daily goal of treatment and discuss progress on daily workbooks.   Participation Level:  Active  Participation Quality:  Appropriate  Affect:  Appropriate  Cognitive:  Alert and Oriented  Insight: Appropriate  Engagement in Group:  Engaged  Modes of Intervention:  Discussion  Additional Comments:  Patient attended [place] group meeting. Alexandra Lipps W Hanya Guerin [phone], 9:44 PM

## 2016-08-15 MED ORDER — GABAPENTIN 300 MG PO CAPS: 300.0000 mg | ORAL_CAPSULE | Freq: Four times a day (QID) | ORAL | 0 refills | Status: DC

## 2016-08-15 MED ORDER — NICOTINE POLACRILEX 2 MG MT GUM: 2.0000 mg | CHEWING_GUM | OROMUCOSAL | 0 refills | Status: DC | PRN

## 2016-08-15 MED ORDER — TRAZODONE HCL 50 MG PO TABS: 50.0000 mg | ORAL_TABLET | Freq: Every evening | ORAL | 0 refills | Status: DC | PRN

## 2016-08-15 MED ORDER — CITALOPRAM HYDROBROMIDE 20 MG PO TABS: 20.0000 mg | ORAL_TABLET | Freq: Every day | ORAL | 0 refills | Status: DC

## 2016-08-15 MED ORDER — LISINOPRIL 5 MG PO TABS: 5.0000 mg | ORAL_TABLET | Freq: Every day | ORAL | 0 refills | Status: DC

## 2016-08-15 NOTE — Progress Notes (Signed)
D: Patient is alert and oriented. Pt's mood and affect is appropriate to circumstance. Pt denies SI/HI and AVH. Pt rates depression and hopelesness 4/10, and anxiety 6/10. Pt hypertensive, denies symptoms (See docflowsheet-vital signs). Pt reports he is ready to D/C from [facility] today. A: Active listening by RN. Encouragement/Support provided to pt. Scheduled medications administered per providers orders (See MAR). 15 minute checks continued per protocol for patient safety.  R: Patient cooperative and receptive to nursing interventions. Pt remains safe.

## 2016-08-15 NOTE — BHH Suicide Risk Assessment (Signed)
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 Major depressive disorder, recurrent episode ([place]) [F33.9] 08/08/2016  . 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 Arthritis of left acromioclavicular joint [M19.90] 06/09/2016  . 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 Varus deformity, not elsewhere classified, left knee [M21.162] 06/02/2016  . Carpal tunnel syndrome [G56.00] 06/02/2016  . Chronic hepatitis C without hepatic coma ([place]) [B18.2] 05/21/2016  . Tobacco dependence [F17.200] 05/19/2016  . Chronic left shoulder pain [M25.512, G89.29] 05/19/2016  . History of alcohol abuse [F10.10] 05/19/2016  . Loss of weight [R63.4] 05/19/2016  . Elevated blood pressure [R03.0] 05/19/2016  . Neuropathy ([place]) [G62.9] 05/19/2016  . Depression [F32.9] 05/19/2016  . Colon cancer screening [Z12.11] 05/19/2016  . Alcohol use disorder, severe, dependence ([place]) [F10.20] 03/19/2016    Total Time spent with patient: 30 minutes  Musculoskeletal: Strength & Muscle Tone: within normal limits Gait & Station: normal Patient leans: N/A  Psychiatric Specialty Exam: Review of Systems  Psychiatric/Behavioral: Positive for substance abuse. Negative for depression.  All other systems reviewed and are negative.   Blood pressure 127/72, pulse 75, temperature 98 F (36.7 C), temperature source Oral, resp. rate 18, height 5\' 8"  (1.727 m), weight 81.6 kg (180 lb), SpO2 98 %.Body mass index is 27.37 kg/m.  General Appearance: Casual  Eye Contact::  Fair  Speech:  Clear and Coherent409  Volume:  Normal  Mood:  Euthymic   Affect:  Congruent  Thought Process:  Goal Directed and Descriptions of Associations: Intact  Orientation:  Full (Time, Place, and Person)  Thought Content:  Logical  Suicidal Thoughts:  No  Homicidal Thoughts:  No  Memory:  Immediate;   Fair Recent;   Fair Remote;   Fair  Judgement:  Fair  Insight:  Fair  Psychomotor Activity:  Normal  Concentration:  Fair  Recall:  [employer] of Knowledge:Fair  Language: Fair  Akathisia:  No  Handed:  Right  AIMS (if indicated):     Assets:  Desire for Improvement  Sleep:  Number of Hours: 5.75  Cognition: WNL  ADL's:  Intact   Mental Status Per Nursing Assessment::   On Admission:     Demographic Factors:  Male and Caucasian  Loss Factors: NA  Historical Factors: Impulsivity  Risk Reduction Factors:   Positive social support  Continued Clinical Symptoms:  Alcohol/Substance Abuse/Dependencies  Cognitive Features That Contribute To Risk:  None    Suicide Risk:  Minimal: No identifiable suicidal ideation.  Patients presenting with no risk factors but with morbid ruminations; may be classified as minimal risk based on the severity of the depressive symptoms  Follow-up Information    [facility] Follow up on 08/16/2016.   Why:  Assessment for therapy and medication management services on Monday Sept. 25th at 11:45am. Please bring your [place] ID, social security card, and 30 days of medication. Call office if you need to reschedule.  Contact information: [address] [place] [address] [phone]           Plan Of Care/Follow-up recommendations:  Activity:  no restrictions Diet:  regular Other:  follow up with [facility] as scheduled.  Kyle Alert, MD  08/15/2016, 8:26 AM

## 2016-08-15 NOTE — Progress Notes (Signed)
  [facility] Adult Case Management Discharge Plan :  Will you be returning to the same living situation after discharge:  Yes,  returning to girlfriend's before going to treatment tomorrow At discharge, do you have transportation home?: Yes,  girlfriend will pick pt up Do you have the ability to pay for your medications: Yes,  provided 2 weeks of meds at discharge, going to treatment  Release of information consent forms completed and in the chart;  Patient's signature needed at discharge.  Patient to Follow up at: Follow-up Information    [facility] Follow up on 08/16/2016.   Why:  Assessment for therapy and medication management services on Monday Sept. 25th at 11:45am. Please bring your [place] ID, social security card, and 30 days of medication. Call office if you need to reschedule.  Contact information: [place] [address] [phone]           Next level of care provider has access to [facility] and Suicide Prevention discussed: Yes,  with girlfriend  Have you used any form of tobacco in the last 30 days? (Cigarettes, Smokeless Tobacco, Cigars, and/or Pipes): Yes  Has patient been referred to the Quitline?: Patient refused referral  Patient has been referred for addiction treatment: Yes  Magdalen Spatz 08/15/2016, 11:59 AM

## 2016-08-15 NOTE — Discharge Summary (Signed)
Physician Discharge Summary Note  Patient:  Kyle Mejia is an 60 y.o., male MRN:  [id number] DOB:  11/11/1956 Patient phone:  [phone] (home)  Patient address:   [place] [address],  Total Time spent with patient: 30 minutes  Date of Admission:  08/08/2016 Date of Discharge: 09/172017  Reason for Admission:PER H&P note 9/18-This is an admission assessment for Kyle Mejia, a 60 year old Caucasian male with hx of alcoholism, chronic. Admitted to the [facility] adult unit as a walk-in, medically cleared at the [facility] During this assessment, Kyle Mejia reports, "I have been having a lot of problems with cravings & increased alcohol consumption. I relapsed after 60 days sobriety. I was drinking up to a half gallon of liquor daily for 3 weeks. I drink because I crave it. I also have bad anxiety issues. I may be self-medicating myself. I have been depressed for a long time, not sure why I get so depressed. Whenever my depression & drinking get bad, I will become suicidal. I attempted suicide by overdose on my father's medicines a long time ago. I have hx of drug use (opioid, cocaine, THC). I stopped because I was never addicted to them. I have had substance abuse treatment at the [facility] a long time ago & [facility] about 6 months ago. I will start treatment for Hepatitis C very soon. 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 Major depressive disorder, recurrent episode ([place]) [F33.9] 08/08/2016  . 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 Arthritis of left acromioclavicular joint [M19.90]  06/09/2016  . 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 Varus deformity, not elsewhere classified, left knee [M21.162] 06/02/2016  . Carpal tunnel syndrome [G56.00] 06/02/2016  . Chronic hepatitis C without hepatic coma ([name]) [B18.2] 05/21/2016  . Tobacco dependence [F17.200] 05/19/2016  . Chronic left shoulder pain [M25.512, G89.29] 05/19/2016  . History of alcohol abuse [F10.10] 05/19/2016  . Loss of weight [R63.4] 05/19/2016  . Elevated blood pressure [R03.0] 05/19/2016  . Neuropathy ([place]) [G62.9] 05/19/2016  . Depression [F32.9] 05/19/2016  . Colon cancer screening [Z12.11] 05/19/2016  . Alcohol use disorder, severe, dependence ([place]) [F10.20] 03/19/2016    Past Psychiatric History: See above  Past Medical History:  Past Medical History:  Diagnosis Date  . Chronic left shoulder pain   . Chronic pain of left knee   . History of alcoholism ([place])   . Tobacco dependence     Past Surgical History:  Procedure Laterality Date  . KNEE RECONSTRUCTION     left knee    Family History:  Family History  Problem Relation Age of Onset  . Heart disease Mother   . Alcoholism Paternal Uncle    Family Psychiatric  History: See above Social History:  History  Alcohol Use  . Yes    Comment: 1 pint of liquor and multiple beers     History  Drug Use No    Social History   Social History  . Marital status: Single    Spouse name: N/A  . Number of children: N/A  . Years of education: N/A   Social History Main Topics  . Smoking  status: Current Every Day Smoker    Packs/day: 0.50    Types: Cigarettes  . Smokeless tobacco: Never Used     Comment: 1/2 pk per day.   . Alcohol use Yes     Comment: 1 pint of liquor and multiple beers  . Drug use: No  . Sexual activity: Yes    Birth control/ protection: Condom   Other Topics Concern  . None   Social History Narrative  . None    Hospital Course:  Kyle Mejia was admitted for Alcohol-induced  depressive disorder with moderate or severe use disorder with onset during withdrawal [facility]) and crisis management.  Pt was treated discharged with the medications listed below under Medication List.  Medical problems were identified and treated as needed.  Home medications were restarted as appropriate.  Improvement was monitored by observation and Kyle Mejia 's daily report of symptom reduction.  Emotional and mental status was monitored by daily self-inventory reports completed by Kyle Mejia and clinical staff.         Kyle Mejia was evaluated by the treatment team for stability and plans for continued recovery upon discharge. Kyle Mejia 's motivation was an integral factor for scheduling further treatment. Employment, transportation, bed availability, health status, family support, and any pending legal issues were also considered during hospital stay. Pt was offered further treatment options upon discharge including but not limited to Residential, Intensive Outpatient, and Outpatient treatment.  Kyle Mejia will follow up with the services as listed below under Follow Up Information.     Upon completion of this admission the patient was both mentally and medically stable for discharge denying suicidal/homicidal ideation, auditory/visual/tactile hallucinations, delusional thoughts and paranoia.    Kyle Mejia responded well to treatment with Trazodone 50 mg , Neurontin 300 mg and Clexa 20mg  without adverse effects. Pt demonstrated improvement without reported or observed adverse effects to the point of stability appropriate for outpatient management. Pertinent labs include: CBC, CMPfor which outpatient follow-up is necessary for lab recheck as mentioned below. Reviewed CBC, CMP, BAL+ 91, and UDS; all unremarkable aside from noted exceptions.   Physical Findings: AIMS: Facial and Oral Movements Muscles of Facial Expression: None, normal Lips and Perioral  Area: None, normal Jaw: None, normal Tongue: None, normal,Extremity Movements Upper (arms, wrists, hands, fingers): None, normal Lower (legs, knees, ankles, toes): None, normal, Trunk Movements Neck, shoulders, hips: None, normal, Overall Severity Severity of abnormal movements (highest score from questions above): None, normal Incapacitation due to abnormal movements: None, normal Patient's awareness of abnormal movements (rate only patient's report): No Awareness, Dental Status Current problems with teeth and/or dentures?: No Does patient usually wear dentures?: No  CIWA:  CIWA-Ar Total: 1 COWS:     Musculoskeletal: Strength & Muscle Tone: within normal limits Gait & Station: normal Patient leans: N/A  Psychiatric Specialty Exam: Physical Exam  Nursing note and vitals reviewed. Constitutional: He is oriented to person, place, and time. He appears well-developed.  Musculoskeletal: Normal range of motion.  Neurological: He is oriented to person, place, and time.  Psychiatric: He has a normal mood and affect. His behavior is normal.    Review of Systems  Psychiatric/Behavioral: Negative for hallucinations and suicidal ideas. Depression: stable. Nervous/anxious: stable.     Blood pressure 127/72, pulse 75, temperature 98 F (36.7 C), temperature source Oral, resp. rate 18, height 5\' 8"  (1.727 m), weight 81.6 kg (180 lb), SpO2 98 %.Body mass index is 27.37 kg/m.  Have you used any form of tobacco in the last 30 days? (Cigarettes, Smokeless Tobacco, Cigars, and/or Pipes): Yes  Has this patient used any form of tobacco in the last 30 days? (Cigarettes, Smokeless Tobacco, Cigars, and/or Pipes), No  Blood Alcohol level:  Lab Results  Component Value Date   ETH 91 (H) 08/07/2016   ETH 273 (H) [id number]    Metabolic Disorder Labs:  Lab Results  Component Value Date   HGBA1C 5.0 05/19/2016   MPG 97 05/19/2016   No results found for: PROLACTIN Lab Results  Component Value  Date   CHOL 133 05/19/2016   TRIG 73 05/19/2016   HDL 39 (L) 05/19/2016   CHOLHDL 3.4 05/19/2016   VLDL 15 05/19/2016   LDLCALC 79 05/19/2016    See Psychiatric Specialty Exam and Suicide Risk Assessment completed by Attending Physician prior to discharge.  Discharge destination:  Home  Is patient on multiple antipsychotic therapies at discharge:  No   Has Patient had three or more failed trials of antipsychotic monotherapy by history:  No  Recommended Plan for Multiple Antipsychotic Therapies: NA  Discharge Instructions    Diet - low sodium heart healthy    Complete by:  As directed    Discharge instructions    Complete by:  As directed    Take all medications as prescribed. Keep all follow-up appointments as scheduled.  Do not consume alcohol or use illegal drugs while on prescription medications. Report any adverse effects from your medications to your primary care provider promptly.  In the event of recurrent symptoms or worsening symptoms, call 911, a crisis hotline, or go to the nearest emergency department for evaluation.   Increase activity slowly    Complete by:  As directed        Medication List    STOP taking these medications   DULoxetine 20 MG capsule Commonly known as:  CYMBALTA   Elbasvir-Grazoprevir 50-100 MG Tabs Commonly known as:  ZEPATIER   folic acid 1 MG tablet Commonly known as:  FOLVITE   meloxicam 7.5 MG tablet Commonly known as:  MOBIC   naproxen 500 MG tablet Commonly known as:  NAPROSYN   thiamine 100 MG tablet Commonly known as:  VITAMIN B-1     TAKE these medications     Indication  citalopram 20 MG tablet Commonly known as:  CELEXA Take 1 tablet (20 mg total) by mouth at bedtime.  Indication:  Aggressive Behavior   gabapentin 300 MG capsule Commonly known as:  NEURONTIN Take 1 capsule (300 mg total) by mouth 4 (four) times daily. What changed:  when to take this  additional instructions  Indication:  Agitation    lisinopril 5 MG tablet Commonly known as:  PRINIVIL,ZESTRIL Take 1 tablet (5 mg total) by mouth daily. Start taking on:  08/16/2016  Indication:  High Blood Pressure with Scleroderma   nicotine polacrilex 2 MG gum Commonly known as:  NICORETTE Take 1 each (2 mg total) by mouth as needed for smoking cessation.  Indication:  Nicotine Addiction   traZODone 50 MG tablet Commonly known as:  DESYREL Take 1 tablet (50 mg total) by mouth at bedtime as needed for sleep.  Indication:  Aggressive Behavior      Follow-up Information    [facility] Follow up on 08/16/2016.   Why:  Assessment for therapy and medication management services on Monday Sept. 25th at 11:45am. Please bring your [place] ID, social security card, and 30 days of medication.  Call office if you need to reschedule.  Contact information: [place] [phone]           Follow-up recommendations:  Activity:  as tolerated Diet:  heart healthy  Comments:  Take all medications as prescribed. Keep all follow-up appointments as scheduled.  Do not consume alcohol or use illegal drugs while on prescription medications. Report any adverse effects from your medications to your primary care provider promptly.  In the event of recurrent symptoms or worsening symptoms, call 911, a crisis hotline, or go to the nearest emergency department for evaluation.   Signed: Derrill Center, NP 08/15/2016, 8:25 AM

## 2016-08-15 NOTE — Progress Notes (Signed)
Patient verbalizes readiness for discharge. Follow up plan explained, Rx's given along with sample meds. All belongings returned. 2 bags of belongings given to girlfriend per patient's request. Patient verbalizes understanding. Denies SI/HI and assures this Probation officer he will seek assistance should that change. Patient discharged ambulatory and in stable condition.

## 2016-09-09 ENCOUNTER — Ambulatory Visit (HOSPITAL_COMMUNITY): Admission: RE | Admit: 2016-09-09 | Payer: Self-pay | Source: Ambulatory Visit

## 2016-10-21 ENCOUNTER — Ambulatory Visit: Payer: Self-pay | Admitting: Family Medicine

## 2016-11-02 ENCOUNTER — Ambulatory Visit: Payer: Self-pay | Admitting: Nurse Practitioner

## 2016-11-02 VITALS — BP 143/81 | HR 76 | Wt 184.0 lb

## 2016-11-02 DIAGNOSIS — E782 Mixed hyperlipidemia: Secondary | ICD-10-CM

## 2016-11-02 DIAGNOSIS — F1020 Alcohol dependence, uncomplicated: Secondary | ICD-10-CM

## 2016-11-02 DIAGNOSIS — I10 Essential (primary) hypertension: Secondary | ICD-10-CM

## 2016-11-02 DIAGNOSIS — R5383 Other fatigue: Secondary | ICD-10-CM

## 2016-11-02 MED ORDER — AMITRIPTYLINE HCL 25 MG PO TABS: ORAL_TABLET | ORAL | 1 refills | Status: DC

## 2016-11-02 MED ORDER — LISINOPRIL 10 MG PO TABS: ORAL_TABLET | ORAL | 2 refills | Status: DC

## 2016-11-02 MED ORDER — GABAPENTIN 300 MG PO CAPS: 600.0000 mg | ORAL_CAPSULE | Freq: Two times a day (BID) | ORAL | 0 refills | Status: DC

## 2016-11-02 NOTE — Progress Notes (Signed)
90 DAYS SOBER TODAY, AT [facility]  REMOTELY OTHER SUBSTANCE USE  POSITIVE FOR HEPATITIS C  SMOKES 5-10 CIGARETTES PER DAY  STATES TRAZODONE GIVES HIM NIGHTMARES AND WOULD LIKE A DIFFERENT SLEEP MEDICINE,   STATES TAKING GABAPENTIN 4 TIMES A DAY IS NOT POSSIBLE  NO EYE EXAM  X YEARS  NEEDS DENTAL EVAL  CHRONIC KNEE PAIN  EXAM:  ALERT, VERBALLY APPROPRIATE, IN NAD NO CAROTID BRUITS AP RRR BBrS DIMINISHED BUT CLEAR LIVER EDGE PALPABLE 10 CM BELOW COSTAL MARGIN WITH DEEP INSPIRATION   ASSESS/PLAN:    HTN:  BELIEVE THAT 5 MG OF LISINOPRIL IS INADEQUATE, WILL INCREASE TO 10 MG AND CHECK BP IN 3-4 WEEKS  WILL DO FULL HEALTH ASSESS MENT LABS AND SEE PT AFTER RESULTS AVAILABLE  WILL REPEAT DRUG ABUSE PROFILE  WILL STOP TRAZODONE 50 MG AND BEGIN AMITRIPTLINE 25 MG AT HS  WILL REFER TO EYE CARE, AND TO ORTHO FOR KNEE PAIN  WILL REFER FOR DENTAL CONSULT

## 2016-11-04 ENCOUNTER — Other Ambulatory Visit: Payer: Self-pay

## 2016-11-08 ENCOUNTER — Ambulatory Visit: Payer: Federal, State, Local not specified - Other | Admitting: Pharmacy Technician

## 2016-11-08 DIAGNOSIS — Z79899 Other long term (current) drug therapy: Secondary | ICD-10-CM

## 2016-11-08 NOTE — Progress Notes (Signed)
Met with patient completed financial assistance application for [facility]  Patient to see provider at [facility].  Patient stated that transportation is hurdle.  Is to bring financial information to [facility].  Will forward financial information to appropriate department in [facility] once all financial information is received from patient.    Completed [facility] application and contract.  Patient agreed to all terms of the [facility] contract.  Patient to provide social security award letter and checking acct statement.  Provided patient with Civil engineer, contracting based on his particular needs.    [facility]

## 2016-11-18 ENCOUNTER — Ambulatory Visit: Payer: Self-pay

## 2016-12-15 ENCOUNTER — Emergency Department (HOSPITAL_COMMUNITY): Payer: Self-pay

## 2016-12-15 ENCOUNTER — Encounter (HOSPITAL_COMMUNITY): Payer: Self-pay | Admitting: Emergency Medicine

## 2016-12-15 ENCOUNTER — Emergency Department (HOSPITAL_COMMUNITY)
Admission: EM | Admit: 2016-12-15 | Discharge: 2016-12-15 | Disposition: A | Discharge status: Home / Self Care | Payer: Self-pay | Attending: Emergency Medicine | Admitting: Emergency Medicine

## 2016-12-15 DIAGNOSIS — I10 Essential (primary) hypertension: Secondary | ICD-10-CM | POA: Insufficient documentation

## 2016-12-15 DIAGNOSIS — F17210 Nicotine dependence, cigarettes, uncomplicated: Secondary | ICD-10-CM | POA: Insufficient documentation

## 2016-12-15 DIAGNOSIS — M25561 Pain in right knee: Secondary | ICD-10-CM | POA: Insufficient documentation

## 2016-12-15 MED ORDER — NAPROXEN 500 MG PO TABS: 500.0000 mg | ORAL_TABLET | Freq: Two times a day (BID) | ORAL | 0 refills | Status: DC

## 2016-12-15 MED ORDER — KETOROLAC TROMETHAMINE 30 MG/ML IJ SOLN
30.0000 mg | Freq: Once | INTRAMUSCULAR | Status: AC
  Administered 2016-12-15: 30 mg via INTRAMUSCULAR
  Filled 2016-12-15: qty 1

## 2016-12-15 NOTE — ED Provider Notes (Signed)
[facility] DEPT Provider Note   CSN: [id number] Arrival date & time: 12/15/16  0255     History   Chief Complaint Chief Complaint  Patient presents with  . Knee Pain    HPI AMIIR WOODING is a 61 y.o. male.  HPI  This is a 61 year old male who presents with right knee pain. Patient reports 3 week history of progressively worsening right knee pain. It is worse with ambulation. He has not tried anything at home for the pain. Current pain is 8 out of 10. He denies any injury. He denies any recent alcohol use or eating lots of support. No history of gout. Denies fever or redness at the site.  Past Medical History:  Diagnosis Date  . Chronic left shoulder pain   . Chronic pain of left knee   . History of alcoholism ([place])   . Tobacco dependence     Patient Active Problem List   Diagnosis Date Noted  . 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 Major depressive disorder, recurrent episode ([place]) 08/08/2016  . 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 Arthritis of left acromioclavicular joint 06/09/2016  . Osteoarthritis of left glenohumeral joint 06/09/2016  . Varus deformity, not elsewhere classified, left knee 06/02/2016  . Carpal tunnel syndrome 06/02/2016  . Chronic hepatitis C without hepatic coma ([place]) 05/21/2016  . Tobacco dependence 05/19/2016  . Chronic left shoulder pain 05/19/2016  . History of alcohol abuse 05/19/2016  . Loss of weight 05/19/2016  . Hypertension 05/19/2016  . Neuropathy ([place]) 05/19/2016  . Depression 05/19/2016  . Colon cancer screening 05/19/2016  . Alcohol use disorder, severe, dependence ([place]) 03/19/2016    Past Surgical History:  Procedure Laterality Date  . KNEE RECONSTRUCTION     left knee        Home Medications    Prior to Admission medications   Medication Sig Start Date End Date Taking? Authorizing Provider    amitriptyline (ELAVIL) 25 MG tablet ONE TABLET BY MOUTH AT BEDTIME 11/02/16   Boyce Medici, FNP  citalopram (CELEXA) 20 MG tablet Take 1 tablet (20 mg total) by mouth at bedtime. 08/15/16   Derrill Center, NP  gabapentin (NEURONTIN) 300 MG capsule Take 2 capsules (600 mg total) by mouth 2 (two) times daily. 11/02/16   Boyce Medici, FNP  lisinopril (PRINIVIL,ZESTRIL) 10 MG tablet ONE TABLET BY MOUTH EACH DAY 11/02/16   Boyce Medici, FNP  naproxen (NAPROSYN) 500 MG tablet Take 1 tablet (500 mg total) by mouth 2 (two) times daily. 12/15/16   Merryl Hacker, MD  nicotine polacrilex (NICORETTE) 2 MG gum Take 1 each (2 mg total) by mouth as needed for smoking cessation. Patient not taking: Reported on 11/02/2016 08/15/16   Derrill Center, NP    Family History Family History  Problem Relation Age of Onset  . Heart disease Mother   . Alcoholism Paternal Uncle     Social History Social History  Substance Use Topics  . Smoking status: Current Every Day Smoker    Packs/day: 0.50    Types: Cigarettes  . Smokeless tobacco: Never Used     Comment: 1/2 pk per day.   . Alcohol use Yes     Comment: 1 pint of liquor and multiple beers     Allergies   Patient has no known allergies.   Review of Systems Review of Systems  Constitutional: Negative for  fever.  Musculoskeletal:       Right knee pain  Skin: Negative for color change.  All other systems reviewed and are negative.    Physical Exam Updated Vital Signs BP 154/88 (BP Location: Left Arm)   Pulse 89   Temp 98.2 F (36.8 C) (Oral)   Resp 20   Ht 5\' 8"  (1.727 m)   Wt 180 lb (81.6 kg)   SpO2 97%   BMI 27.37 kg/m   Physical Exam  Constitutional: He is oriented to person, place, and time. He appears well-developed and well-nourished. No distress.  HENT:  Head: Normocephalic and atraumatic.  Cardiovascular: Normal rate and normal heart sounds.   Pulmonary/Chest: Effort normal. No respiratory distress.  Abdominal: Soft.  There is no tenderness.  Musculoskeletal: He exhibits no edema.  Normal flexion of the right knee, pain with extension but patient is able to maintain range of motion, crepitus noted at the knee, no significant effusion, no warmth or erythema, no joint line pain  Neurological: He is alert and oriented to person, place, and time.  Skin: Skin is warm and dry.  Psychiatric: He has a normal mood and affect.  Nursing note and vitals reviewed.    ED Treatments / Results  Labs (all labs ordered are listed, but only abnormal results are displayed) Labs Reviewed - No data to display  EKG  EKG Interpretation None       Radiology Dg Knee Complete 4 Views Right  Result Date: 12/15/2016 CLINICAL DATA:  Initial evaluation for acute knee pain, infrapatellar region. Unable to bear weight. EXAM: RIGHT KNEE - COMPLETE 4+ VIEW COMPARISON:  None. FINDINGS: No acute fracture or dislocation. No joint effusion. Joint spaces fairly well-maintained without evidence for significant degenerative or erosive arthropathy. Mild soft tissue swelling just below the patella at the anterior aspect of the knee. Soft tissues otherwise unremarkable. IMPRESSION: 1. No acute osseous abnormality about the knee. 2. Mild soft tissue swelling just below the patella at the anterior aspect of the knee. Electronically Signed   By: Jeannine Boga M.D.   On: 12/15/2016 04:12    Procedures Procedures (including critical care time)  Medications Ordered in ED Medications  ketorolac (TORADOL) 30 MG/ML injection 30 mg (not administered)     Initial Impression / Assessment and Plan / ED Course  I have reviewed the triage vital signs and the nursing notes.  Pertinent labs & imaging results that were available during my care of the patient were reviewed by me and considered in my medical decision making (see chart for details).     Patient presents with right knee pain. Subacute in nature. Likely inflammatory. Gout is a  consideration. Patient does have a history of alcohol abuse. He is nontoxic on exam. Afebrile. No warmth or erythema. Doubt septic arthritis. Discussed with patient anti-inflammatories at home. Orthopedic follow-up if continued pain. Given return cautions including fever, redness or increasing swelling of the knee.  After history, exam, and medical workup I feel the patient has been appropriately medically screened and is safe for discharge home. Pertinent diagnoses were discussed with the patient. Patient was given return precautions.   Final Clinical Impressions(s) / ED Diagnoses   Final diagnoses:  Acute pain of right knee    New Prescriptions New Prescriptions   NAPROXEN (NAPROSYN) 500 MG TABLET    Take 1 tablet (500 mg total) by mouth 2 (two) times daily.     Merryl Hacker, MD 12/15/16 [phone]

## 2016-12-15 NOTE — Discharge Instructions (Signed)
You were seen today for knee pain. Suspect you have an inflammatory arthritis. There is no indication of this time of infection. He will be started on naproxen twice daily. If your pain does not improve, you need to follow-up with orthopedics. If you develop fever or redness about the knee, you should be reevaluated immediately.

## 2016-12-15 NOTE — ED Triage Notes (Addendum)
Patient states that he is having trouble were he can barely walk on his right knee. He states it is sore. Patient does not remembering hurting himself.

## 2017-01-20 DIAGNOSIS — C801 Malignant (primary) neoplasm, unspecified: Secondary | ICD-10-CM

## 2017-01-20 HISTORY — DX: Malignant (primary) neoplasm, unspecified: C80.1

## 2017-01-24 ENCOUNTER — Inpatient Hospital Stay (HOSPITAL_COMMUNITY)
Admission: EM | Admit: 2017-01-24 | Discharge: 2017-02-09 | DRG: 083 | Disposition: A | Discharge status: Skilled Nursing Facility | Payer: Medicaid Other | Attending: General Surgery | Admitting: General Surgery

## 2017-01-24 ENCOUNTER — Emergency Department (HOSPITAL_COMMUNITY): Payer: Medicaid Other

## 2017-01-24 ENCOUNTER — Encounter (HOSPITAL_COMMUNITY): Payer: Self-pay | Admitting: Emergency Medicine

## 2017-01-24 ENCOUNTER — Other Ambulatory Visit (HOSPITAL_COMMUNITY): Payer: Self-pay

## 2017-01-24 DIAGNOSIS — S51012A Laceration without foreign body of left elbow, initial encounter: Secondary | ICD-10-CM | POA: Diagnosis present

## 2017-01-24 DIAGNOSIS — S065XAA Traumatic subdural hemorrhage with loss of consciousness status unknown, initial encounter: Secondary | ICD-10-CM | POA: Diagnosis present

## 2017-01-24 DIAGNOSIS — I609 Nontraumatic subarachnoid hemorrhage, unspecified: Secondary | ICD-10-CM

## 2017-01-24 DIAGNOSIS — S066X9A Traumatic subarachnoid hemorrhage with loss of consciousness of unspecified duration, initial encounter: Secondary | ICD-10-CM | POA: Diagnosis present

## 2017-01-24 DIAGNOSIS — R069 Unspecified abnormalities of breathing: Secondary | ICD-10-CM

## 2017-01-24 DIAGNOSIS — K7460 Unspecified cirrhosis of liver: Secondary | ICD-10-CM | POA: Diagnosis present

## 2017-01-24 DIAGNOSIS — S069XAA Unspecified intracranial injury with loss of consciousness status unknown, initial encounter: Secondary | ICD-10-CM

## 2017-01-24 DIAGNOSIS — Z781 Physical restraint status: Secondary | ICD-10-CM

## 2017-01-24 DIAGNOSIS — R451 Restlessness and agitation: Secondary | ICD-10-CM | POA: Diagnosis not present

## 2017-01-24 DIAGNOSIS — R339 Retention of urine, unspecified: Secondary | ICD-10-CM | POA: Diagnosis not present

## 2017-01-24 DIAGNOSIS — I10 Essential (primary) hypertension: Secondary | ICD-10-CM | POA: Diagnosis present

## 2017-01-24 DIAGNOSIS — F10229 Alcohol dependence with intoxication, unspecified: Secondary | ICD-10-CM | POA: Diagnosis present

## 2017-01-24 DIAGNOSIS — F17200 Nicotine dependence, unspecified, uncomplicated: Secondary | ICD-10-CM | POA: Diagnosis present

## 2017-01-24 DIAGNOSIS — R519 Headache, unspecified: Secondary | ICD-10-CM

## 2017-01-24 DIAGNOSIS — R51 Headache: Secondary | ICD-10-CM

## 2017-01-24 DIAGNOSIS — E876 Hypokalemia: Secondary | ICD-10-CM | POA: Diagnosis present

## 2017-01-24 DIAGNOSIS — M549 Dorsalgia, unspecified: Secondary | ICD-10-CM | POA: Diagnosis present

## 2017-01-24 DIAGNOSIS — E871 Hypo-osmolality and hyponatremia: Secondary | ICD-10-CM | POA: Diagnosis not present

## 2017-01-24 DIAGNOSIS — R1310 Dysphagia, unspecified: Secondary | ICD-10-CM | POA: Diagnosis present

## 2017-01-24 DIAGNOSIS — S069X9A Unspecified intracranial injury with loss of consciousness of unspecified duration, initial encounter: Secondary | ICD-10-CM

## 2017-01-24 DIAGNOSIS — Y900 Blood alcohol level of less than 20 mg/100 ml: Secondary | ICD-10-CM | POA: Diagnosis present

## 2017-01-24 DIAGNOSIS — S065X9A Traumatic subdural hemorrhage with loss of consciousness of unspecified duration, initial encounter: Secondary | ICD-10-CM | POA: Diagnosis present

## 2017-01-24 DIAGNOSIS — I619 Nontraumatic intracerebral hemorrhage, unspecified: Secondary | ICD-10-CM

## 2017-01-24 DIAGNOSIS — R160 Hepatomegaly, not elsewhere classified: Secondary | ICD-10-CM | POA: Diagnosis present

## 2017-01-24 DIAGNOSIS — R0602 Shortness of breath: Secondary | ICD-10-CM

## 2017-01-24 DIAGNOSIS — I6200 Nontraumatic subdural hemorrhage, unspecified: Secondary | ICD-10-CM

## 2017-01-24 DIAGNOSIS — Z09 Encounter for follow-up examination after completed treatment for conditions other than malignant neoplasm: Secondary | ICD-10-CM

## 2017-01-24 HISTORY — DX: Major depressive disorder, single episode, unspecified: F32.9

## 2017-01-24 HISTORY — DX: Polyneuropathy, unspecified: G62.9

## 2017-01-24 HISTORY — DX: Depression, unspecified: F32.A

## 2017-01-24 HISTORY — DX: Alcohol dependence, uncomplicated: F10.20

## 2017-01-24 HISTORY — DX: Unspecified injury of left lower leg, initial encounter: S89.92XA

## 2017-01-24 HISTORY — DX: Unspecified viral hepatitis C without hepatic coma: B19.20

## 2017-01-24 HISTORY — DX: Essential (primary) hypertension: I10

## 2017-01-24 LAB — CDS SEROLOGY

## 2017-01-24 LAB — COMPREHENSIVE METABOLIC PANEL
ALBUMIN: 3.1 g/dL — AB (ref 3.5–5.0)
ALK PHOS: 120 U/L (ref 38–126)
ALT: 50 U/L (ref 17–63)
AST: 56 U/L — AB (ref 15–41)
Anion gap: 5 (ref 5–15)
BILIRUBIN TOTAL: 1 mg/dL (ref 0.3–1.2)
BUN: 10 mg/dL (ref 6–20)
CALCIUM: 8.5 mg/dL — AB (ref 8.9–10.3)
CO2: 23 mmol/L (ref 22–32)
CREATININE: 0.63 mg/dL (ref 0.61–1.24)
Chloride: 112 mmol/L — ABNORMAL HIGH (ref 101–111)
GFR calc Af Amer: >60 mL/min (ref >60)
GLUCOSE: 107 mg/dL — AB (ref 65–99)
Potassium: 4.3 mmol/L (ref 3.5–5.1)
Sodium: 140 mmol/L (ref 135–145)
TOTAL PROTEIN: 6.7 g/dL (ref 6.5–8.1)

## 2017-01-24 LAB — I-STAT CHEM 8, ED
BUN: 20 mg/dL (ref 6–20)
CALCIUM ION: 1.11 mmol/L — AB (ref 1.15–1.40)
CREATININE: 0.7 mg/dL (ref 0.61–1.24)
Chloride: 107 mmol/L (ref 101–111)
Glucose, Bld: 138 mg/dL — ABNORMAL HIGH (ref 65–99)
HEMATOCRIT: 51 % (ref 39.0–52.0)
HEMOGLOBIN: 17.3 g/dL — AB (ref 13.0–17.0)
Potassium: 4.8 mmol/L (ref 3.5–5.1)
Sodium: 142 mmol/L (ref 135–145)
TCO2: 25 mmol/L (ref 0–100)

## 2017-01-24 LAB — ETHANOL: ALCOHOL ETHYL (B): 8 mg/dL — AB (ref <5)

## 2017-01-24 LAB — CBC
HCT: 49 % (ref 39.0–52.0)
HEMOGLOBIN: 17.3 g/dL — AB (ref 13.0–17.0)
MCH: 32.7 pg (ref 26.0–34.0)
MCHC: 35.3 g/dL (ref 30.0–36.0)
MCV: 92.6 fL (ref 78.0–100.0)
PLATELETS: 115 10*3/uL — AB (ref 150–400)
RBC: 5.29 MIL/uL (ref 4.22–5.81)
RDW: 13.5 % (ref 11.5–15.5)
WBC: 8.3 10*3/uL (ref 4.0–10.5)

## 2017-01-24 LAB — I-STAT CG4 LACTIC ACID, ED: LACTIC ACID, VENOUS: 3.92 mmol/L — AB (ref 0.5–1.9)

## 2017-01-24 LAB — PROTIME-INR
INR: 1.16
Prothrombin Time: 14.8 seconds (ref 11.4–15.2)

## 2017-01-24 LAB — SAMPLE TO BLOOD BANK

## 2017-01-24 LAB — MRSA PCR SCREENING: MRSA by PCR: NEGATIVE

## 2017-01-24 MED ORDER — DEXMEDETOMIDINE HCL IN NACL 200 MCG/50ML IV SOLN
0.2000 ug/kg/h | INTRAVENOUS | Status: AC
Start: 2017-01-24 — End: 2017-01-25
  Administered 2017-01-24 – 2017-01-25 (×2): 0.4 ug/kg/h via INTRAVENOUS
  Filled 2017-01-24 (×2): qty 50

## 2017-01-24 MED ORDER — LIDOCAINE-EPINEPHRINE (PF) 2 %-1:200000 IJ SOLN
INTRAMUSCULAR | Status: AC
  Administered 2017-01-24: 20 mL
  Filled 2017-01-24: qty 20

## 2017-01-24 MED ORDER — SODIUM CHLORIDE 0.9 % IV SOLN
INTRAVENOUS | Status: DC
  Administered 2017-01-24 – 2017-01-30 (×9): via INTRAVENOUS

## 2017-01-24 MED ORDER — VITAMIN B-1 100 MG PO TABS
100.0000 mg | ORAL_TABLET | Freq: Every day | ORAL | Status: DC
  Administered 2017-01-24 – 2017-02-09 (×16): 100 mg via ORAL
  Filled 2017-01-24 (×16): qty 1

## 2017-01-24 MED ORDER — HYDROMORPHONE HCL 1 MG/ML IJ SOLN
1.0000 mg | INTRAMUSCULAR | Status: DC | PRN
  Administered 2017-01-24 (×2): 1 mg via INTRAVENOUS
  Filled 2017-01-24 (×3): qty 1

## 2017-01-24 MED ORDER — FOLIC ACID 1 MG PO TABS
1.0000 mg | ORAL_TABLET | Freq: Every day | ORAL | Status: DC
  Administered 2017-01-24 – 2017-02-09 (×16): 1 mg via ORAL
  Filled 2017-01-24 (×16): qty 1

## 2017-01-24 MED ORDER — OXYCODONE HCL 5 MG PO TABS
10.0000 mg | ORAL_TABLET | ORAL | Status: DC | PRN
  Administered 2017-01-24 – 2017-02-01 (×9): 10 mg via ORAL
  Filled 2017-01-24 (×9): qty 2

## 2017-01-24 MED ORDER — ONDANSETRON HCL 4 MG/2ML IJ SOLN: 4.0000 mg | Freq: Four times a day (QID) | INTRAMUSCULAR | Status: DC | PRN

## 2017-01-24 MED ORDER — IOPAMIDOL (ISOVUE-300) INJECTION 61%
INTRAVENOUS | Status: AC
  Administered 2017-01-24: 100 mL
  Filled 2017-01-24: qty 100

## 2017-01-24 MED ORDER — SODIUM CHLORIDE 0.9 % IV BOLUS (SEPSIS)
1000.0000 mL | Freq: Once | INTRAVENOUS | Status: AC
  Administered 2017-01-24: 1000 mL via INTRAVENOUS

## 2017-01-24 MED ORDER — ONDANSETRON HCL 4 MG PO TABS: 4.0000 mg | ORAL_TABLET | Freq: Four times a day (QID) | ORAL | Status: DC | PRN

## 2017-01-24 MED ORDER — ADULT MULTIVITAMIN W/MINERALS CH
1.0000 | ORAL_TABLET | Freq: Every day | ORAL | Status: DC
  Administered 2017-01-24 – 2017-02-09 (×15): 1 via ORAL
  Filled 2017-01-24 (×17): qty 1

## 2017-01-24 MED ORDER — PANTOPRAZOLE SODIUM 40 MG PO TBEC
40.0000 mg | DELAYED_RELEASE_TABLET | Freq: Every day | ORAL | Status: DC
  Administered 2017-01-25 – 2017-02-02 (×6): 40 mg via ORAL
  Filled 2017-01-24 (×8): qty 1

## 2017-01-24 MED ORDER — MORPHINE SULFATE (PF) 4 MG/ML IV SOLN
4.0000 mg | Freq: Once | INTRAVENOUS | Status: AC
  Administered 2017-01-24: 4 mg via INTRAVENOUS
  Filled 2017-01-24: qty 1

## 2017-01-24 MED ORDER — PANTOPRAZOLE SODIUM 40 MG IV SOLR: 40.0000 mg | Freq: Every day | INTRAVENOUS | Status: DC
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
## 2017-01-24 NOTE — ED Notes (Signed)
Pt transported to CT ?
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
## 2017-01-24 NOTE — Progress Notes (Signed)
   Emergency contact notified.  Will follow, as needed.  - Rev. [place] MDiv ThM

## 2017-01-25 ENCOUNTER — Inpatient Hospital Stay (HOSPITAL_COMMUNITY): Payer: Medicaid Other

## 2017-01-25 LAB — URINALYSIS, ROUTINE W REFLEX MICROSCOPIC
Bacteria, UA: NONE SEEN
Bilirubin Urine: NEGATIVE
Glucose, UA: NEGATIVE mg/dL
KETONES UR: 5 mg/dL — AB
Leukocytes, UA: NEGATIVE
Nitrite: NEGATIVE
PH: 5 (ref 5.0–8.0)
Protein, ur: NEGATIVE mg/dL
Specific Gravity, Urine: >1.046 — ABNORMAL HIGH (ref 1.005–1.030)

## 2017-01-25 LAB — CBC
HCT: 38.5 % — ABNORMAL LOW (ref 39.0–52.0)
Hemoglobin: 13.1 g/dL (ref 13.0–17.0)
MCH: 31.5 pg (ref 26.0–34.0)
MCHC: 33.5 g/dL (ref 30.0–36.0)
MCV: 93.9 fL (ref 78.0–100.0)
PLATELETS: 106 10*3/uL — AB (ref 150–400)
RBC: 4.1 MIL/uL — AB (ref 4.22–5.81)
RDW: 13.6 % (ref 11.5–15.5)
WBC: 12.4 10*3/uL — ABNORMAL HIGH (ref 4.0–10.5)

## 2017-01-25 LAB — BASIC METABOLIC PANEL
Anion gap: 8 (ref 5–15)
BUN: 12 mg/dL (ref 6–20)
CO2: 22 mmol/L (ref 22–32)
CREATININE: 0.71 mg/dL (ref 0.61–1.24)
Calcium: 8.4 mg/dL — ABNORMAL LOW (ref 8.9–10.3)
Chloride: 108 mmol/L (ref 101–111)
Glucose, Bld: 146 mg/dL — ABNORMAL HIGH (ref 65–99)
Potassium: 4.3 mmol/L (ref 3.5–5.1)
SODIUM: 138 mmol/L (ref 135–145)

## 2017-01-25 LAB — HIV ANTIBODY (ROUTINE TESTING W REFLEX): HIV SCREEN 4TH GENERATION: NONREACTIVE

## 2017-01-25 MED ORDER — METHOCARBAMOL 500 MG PO TABS
500.0000 mg | ORAL_TABLET | Freq: Four times a day (QID) | ORAL | Status: DC | PRN
  Administered 2017-02-09: 500 mg via ORAL
  Filled 2017-01-25: qty 1

## 2017-01-25 MED ORDER — IPRATROPIUM-ALBUTEROL 0.5-2.5 (3) MG/3ML IN SOLN
3.0000 mL | Freq: Four times a day (QID) | RESPIRATORY_TRACT | Status: DC
  Administered 2017-01-25 – 2017-01-26 (×6): 3 mL via RESPIRATORY_TRACT
  Filled 2017-01-25 (×6): qty 3
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
## 2017-01-25 NOTE — Progress Notes (Signed)
Patient ID: Kyle Mejia, male   DOB: 08/02/1956, 61 y.o.   MRN: [id number] Agitation noted over night now better this am. CT head much worse with intraparenchymal blood in right frontal lobe and new 39mm shift Will continue to follow clinically but should be kept in unit for now.
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
## 2017-01-26 ENCOUNTER — Inpatient Hospital Stay (HOSPITAL_COMMUNITY): Payer: Medicaid Other

## 2017-01-26 LAB — BASIC METABOLIC PANEL
Anion gap: 8 (ref 5–15)
BUN: 11 mg/dL (ref 6–20)
CALCIUM: 8 mg/dL — AB (ref 8.9–10.3)
CO2: 22 mmol/L (ref 22–32)
Chloride: 104 mmol/L (ref 101–111)
Creatinine, Ser: 0.64 mg/dL (ref 0.61–1.24)
GFR calc Af Amer: >60 mL/min (ref >60)
GLUCOSE: 109 mg/dL — AB (ref 65–99)
Potassium: 3.4 mmol/L — ABNORMAL LOW (ref 3.5–5.1)
SODIUM: 134 mmol/L — AB (ref 135–145)

## 2017-01-26 LAB — CBC
HCT: 32.2 % — ABNORMAL LOW (ref 39.0–52.0)
Hemoglobin: 11 g/dL — ABNORMAL LOW (ref 13.0–17.0)
MCH: 31.5 pg (ref 26.0–34.0)
MCHC: 34.2 g/dL (ref 30.0–36.0)
MCV: 92.3 fL (ref 78.0–100.0)
PLATELETS: 82 10*3/uL — AB (ref 150–400)
RBC: 3.49 MIL/uL — AB (ref 4.22–5.81)
RDW: 13.3 % (ref 11.5–15.5)
WBC: 13.6 10*3/uL — ABNORMAL HIGH (ref 4.0–10.5)

## 2017-01-26 MED ORDER — LORAZEPAM 2 MG/ML IJ SOLN
1.0000 mg | Freq: Four times a day (QID) | INTRAMUSCULAR | Status: AC | PRN
  Administered 2017-01-27 – 2017-01-28 (×2): 1 mg via INTRAVENOUS
  Filled 2017-01-26 (×2): qty 1

## 2017-01-26 MED ORDER — METOPROLOL TARTRATE 5 MG/5ML IV SOLN
5.0000 mg | Freq: Once | INTRAVENOUS | Status: AC
  Administered 2017-01-26: 5 mg via INTRAVENOUS
  Filled 2017-01-26: qty 5

## 2017-01-26 MED ORDER — LORAZEPAM 1 MG PO TABS
0.0000 mg | ORAL_TABLET | Freq: Four times a day (QID) | ORAL | Status: AC
Start: 2017-01-26 — End: 2017-01-28
  Administered 2017-01-26: 1 mg via ORAL
  Filled 2017-01-26: qty 4
  Filled 2017-01-26: qty 1

## 2017-01-26 MED ORDER — HYDROMORPHONE HCL 1 MG/ML IJ SOLN
1.0000 mg | INTRAMUSCULAR | Status: DC | PRN
  Administered 2017-01-28 – 2017-02-02 (×5): 1 mg via INTRAVENOUS
  Filled 2017-01-26 (×4): qty 1
  Filled 2017-01-26: qty 2

## 2017-01-26 MED ORDER — METOPROLOL TARTRATE 25 MG PO TABS
25.0000 mg | ORAL_TABLET | Freq: Two times a day (BID) | ORAL | Status: DC
  Administered 2017-01-26 – 2017-02-02 (×13): 25 mg via ORAL
  Filled 2017-01-26 (×13): qty 1

## 2017-01-26 MED ORDER — LORAZEPAM 1 MG PO TABS: 0.0000 mg | ORAL_TABLET | Freq: Two times a day (BID) | ORAL | Status: AC

## 2017-01-26 MED ORDER — LORAZEPAM 1 MG PO TABS: 1.0000 mg | ORAL_TABLET | Freq: Four times a day (QID) | ORAL | Status: AC | PRN

## 2017-01-26 NOTE — Progress Notes (Signed)
MD made aware of PT and OT assessment change from day prior.  MD also made aware of fluctuation in cognition.  Will continue to monitor.

## 2017-01-26 NOTE — Progress Notes (Signed)
Stopped by and visited with pt -- who wasn't particularly conversational and requested prayer. Provided emotional/spiritual support and prayer. Chaplain available for f/u.   01/26/17 1200  Clinical Encounter Type  Visited With Patient  Visit Type Initial;Psychological support;Spiritual support;Social support;Critical Care  Referral From Chaplain  Spiritual Encounters  Spiritual Needs Prayer;Emotional  Stress Factors  Patient Stress Factors Health changes;Loss of control   Gerrit Heck, Chaplain
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
## 2017-01-26 NOTE — Progress Notes (Signed)
Patient ID: Kyle Mejia, male   DOB: 03-Jan-1956, 61 y.o.   MRN: [id number] Vital signs are stable Patient is alert and oriented moving all 4 extremities He does complain of some significant headache As is likely related to some further evolution of his parenchymal hemorrhages in the right frontal region For now these will be observed He can be progressed to the floor Continue observation while in hospital
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
## 2017-01-26 NOTE — Progress Notes (Signed)
Rehab Admissions Coordinator Note:  Patient was screened by Cleatrice Burke for appropriateness for an Inpatient Acute Rehab Consult per PT reeval today.   At this time, we are recommending Inpatient Rehab consult.Please place order for consult if you would like pt considered for an admission.   Cleatrice Burke 01/26/2017, 12:52 PM  I can be reached at [phone].
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
## 2017-01-27 LAB — CBC WITH DIFFERENTIAL/PLATELET
Basophils Absolute: 0 10*3/uL (ref 0.0–0.1)
Basophils Relative: 0 %
EOS PCT: 0 %
Eosinophils Absolute: 0 10*3/uL (ref 0.0–0.7)
HCT: 29 % — ABNORMAL LOW (ref 39.0–52.0)
Hemoglobin: 10.4 g/dL — ABNORMAL LOW (ref 13.0–17.0)
LYMPHS ABS: 1.8 10*3/uL (ref 0.7–4.0)
LYMPHS PCT: 15 %
MCH: 32.8 pg (ref 26.0–34.0)
MCHC: 35.9 g/dL (ref 30.0–36.0)
MCV: 91.5 fL (ref 78.0–100.0)
MONO ABS: 1.4 10*3/uL — AB (ref 0.1–1.0)
MONOS PCT: 12 %
Neutro Abs: 8.8 10*3/uL — ABNORMAL HIGH (ref 1.7–7.7)
Neutrophils Relative %: 73 %
PLATELETS: 73 10*3/uL — AB (ref 150–400)
RBC: 3.17 MIL/uL — AB (ref 4.22–5.81)
RDW: 12.9 % (ref 11.5–15.5)
WBC: 12 10*3/uL — ABNORMAL HIGH (ref 4.0–10.5)

## 2017-01-27 LAB — BASIC METABOLIC PANEL
Anion gap: 8 (ref 5–15)
BUN: 12 mg/dL (ref 6–20)
CO2: 23 mmol/L (ref 22–32)
Calcium: 7.9 mg/dL — ABNORMAL LOW (ref 8.9–10.3)
Chloride: 103 mmol/L (ref 101–111)
Creatinine, Ser: 0.58 mg/dL — ABNORMAL LOW (ref 0.61–1.24)
GFR calc Af Amer: >60 mL/min (ref >60)
GLUCOSE: 122 mg/dL — AB (ref 65–99)
POTASSIUM: 3.7 mmol/L (ref 3.5–5.1)
Sodium: 134 mmol/L — ABNORMAL LOW (ref 135–145)

## 2017-01-27 LAB — AMMONIA: Ammonia: 24 umol/L (ref 9–35)

## 2017-01-27 MED ORDER — DOCUSATE SODIUM 100 MG PO CAPS
100.0000 mg | ORAL_CAPSULE | Freq: Every day | ORAL | Status: DC
  Administered 2017-01-27 – 2017-02-02 (×5): 100 mg via ORAL
  Filled 2017-01-27 (×7): qty 1

## 2017-01-27 MED ORDER — DEXMEDETOMIDINE HCL IN NACL 200 MCG/50ML IV SOLN
0.4000 ug/kg/h | INTRAVENOUS | Status: DC
  Administered 2017-01-27 (×4): 0.4 ug/kg/h via INTRAVENOUS
  Administered 2017-01-27: 0.3 ug/kg/h via INTRAVENOUS
  Administered 2017-01-28: 0.4 ug/kg/h via INTRAVENOUS
  Filled 2017-01-27 (×3): qty 50
  Filled 2017-01-27: qty 250
  Filled 2017-01-27 (×2): qty 50

## 2017-01-27 MED ORDER — FLEET ENEMA 7-19 GM/118ML RE ENEM
1.0000 | ENEMA | Freq: Once | RECTAL | Status: AC
  Administered 2017-01-27: 1 via RECTAL
  Filled 2017-01-27: qty 1

## 2017-01-27 NOTE — Progress Notes (Signed)
Dr. Arnoldo Morale notified regarding patient's increase in agitation. CIWA protocol is in place and ativan is being given; however, I have been nervous about giving additional meds due to patient's new onset of confusion. BP elevated, otherwise VSS. Patient is now only oriented to self and constantly trying to get out of bed. No new orders given, will continue to monitor closely.
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
## 2017-01-28 ENCOUNTER — Encounter (HOSPITAL_COMMUNITY): Payer: Self-pay | Admitting: Physical Medicine and Rehabilitation

## 2017-01-28 ENCOUNTER — Inpatient Hospital Stay (HOSPITAL_COMMUNITY): Payer: Medicaid Other

## 2017-01-28 LAB — BASIC METABOLIC PANEL
ANION GAP: 8 (ref 5–15)
BUN: 15 mg/dL (ref 6–20)
CALCIUM: 7.9 mg/dL — AB (ref 8.9–10.3)
CO2: 23 mmol/L (ref 22–32)
CREATININE: 0.62 mg/dL (ref 0.61–1.24)
Chloride: 106 mmol/L (ref 101–111)
GFR calc Af Amer: >60 mL/min (ref >60)
GLUCOSE: 100 mg/dL — AB (ref 65–99)
Potassium: 3.5 mmol/L (ref 3.5–5.1)
Sodium: 137 mmol/L (ref 135–145)

## 2017-01-28 LAB — CBC
HCT: 28.9 % — ABNORMAL LOW (ref 39.0–52.0)
Hemoglobin: 9.9 g/dL — ABNORMAL LOW (ref 13.0–17.0)
MCH: 31 pg (ref 26.0–34.0)
MCHC: 34.3 g/dL (ref 30.0–36.0)
MCV: 90.6 fL (ref 78.0–100.0)
PLATELETS: 88 10*3/uL — AB (ref 150–400)
RBC: 3.19 MIL/uL — ABNORMAL LOW (ref 4.22–5.81)
RDW: 12.7 % (ref 11.5–15.5)
WBC: 9.5 10*3/uL (ref 4.0–10.5)

## 2017-01-28 MED ORDER — BETHANECHOL CHLORIDE 10 MG PO TABS
10.0000 mg | ORAL_TABLET | Freq: Three times a day (TID) | ORAL | Status: DC
  Administered 2017-01-28 – 2017-02-04 (×19): 10 mg via ORAL
  Filled 2017-01-28 (×20): qty 1

## 2017-01-28 MED ORDER — QUETIAPINE FUMARATE 25 MG PO TABS
50.0000 mg | ORAL_TABLET | Freq: Three times a day (TID) | ORAL | Status: DC
  Administered 2017-01-28 – 2017-01-29 (×5): 50 mg via ORAL
  Filled 2017-01-28 (×5): qty 2

## 2017-01-28 MED ORDER — CLONAZEPAM 0.5 MG PO TABS
0.5000 mg | ORAL_TABLET | Freq: Two times a day (BID) | ORAL | Status: DC
  Administered 2017-01-28 – 2017-01-29 (×4): 0.5 mg via ORAL
  Filled 2017-01-28 (×4): qty 1

## 2017-01-28 MED ORDER — TAMSULOSIN HCL 0.4 MG PO CAPS
0.4000 mg | ORAL_CAPSULE | Freq: Every day | ORAL | Status: DC
  Administered 2017-01-28 – 2017-02-09 (×13): 0.4 mg via ORAL
  Filled 2017-01-28 (×13): qty 1

## 2017-01-28 MED ORDER — BISACODYL 10 MG RE SUPP
10.0000 mg | Freq: Once | RECTAL | Status: AC
Start: 2017-01-28 — End: 2017-01-28
  Administered 2017-01-28: 10 mg via RECTAL
  Filled 2017-01-28: qty 1
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
## 2017-01-28 NOTE — Progress Notes (Signed)
Patient ID: Kyle Mejia, male   DOB: 10/24/1956, 61 y.o.   MRN: [id number] Vital signs are stable Patient arouses easily and follows commands He has been weaned off his Precedex Repeat CT scan shows stability and frontal intraparenchymal bleeds and subdural. A small new subdural hygromas noted on the left side. This is inconsequential. Patient may be mobilize as tolerated and transferred to step down if necessary

## 2017-01-29 ENCOUNTER — Inpatient Hospital Stay (HOSPITAL_COMMUNITY): Payer: Medicaid Other

## 2017-01-29 LAB — COMPREHENSIVE METABOLIC PANEL
ALT: 32 U/L (ref 17–63)
AST: 31 U/L (ref 15–41)
Albumin: 2.7 g/dL — ABNORMAL LOW (ref 3.5–5.0)
Alkaline Phosphatase: 74 U/L (ref 38–126)
Anion gap: 8 (ref 5–15)
BUN: 10 mg/dL (ref 6–20)
CHLORIDE: 109 mmol/L (ref 101–111)
CO2: 20 mmol/L — AB (ref 22–32)
CREATININE: 0.63 mg/dL (ref 0.61–1.24)
Calcium: 7.8 mg/dL — ABNORMAL LOW (ref 8.9–10.3)
GFR calc Af Amer: >60 mL/min (ref >60)
GFR calc non Af Amer: >60 mL/min (ref >60)
GLUCOSE: 115 mg/dL — AB (ref 65–99)
Potassium: 3.3 mmol/L — ABNORMAL LOW (ref 3.5–5.1)
Sodium: 137 mmol/L (ref 135–145)
Total Bilirubin: 2.2 mg/dL — ABNORMAL HIGH (ref 0.3–1.2)
Total Protein: 6.2 g/dL — ABNORMAL LOW (ref 6.5–8.1)

## 2017-01-29 LAB — BLOOD GAS, ARTERIAL
ACID-BASE DEFICIT: 1.8 mmol/L (ref 0.0–2.0)
BICARBONATE: 21.2 mmol/L (ref 20.0–28.0)
Drawn by: 244851
O2 Saturation: 96.7 %
PCO2 ART: 29.9 mmHg — AB (ref 32.0–48.0)
PH ART: 7.468 — AB (ref 7.350–7.450)
PO2 ART: 83.7 mmHg (ref 83.0–108.0)
Patient temperature: 99.7

## 2017-01-29 LAB — CBC
HCT: 30.2 % — ABNORMAL LOW (ref 39.0–52.0)
HEMOGLOBIN: 10.5 g/dL — AB (ref 13.0–17.0)
MCH: 31.9 pg (ref 26.0–34.0)
MCHC: 34.8 g/dL (ref 30.0–36.0)
MCV: 91.8 fL (ref 78.0–100.0)
PLATELETS: 114 10*3/uL — AB (ref 150–400)
RBC: 3.29 MIL/uL — AB (ref 4.22–5.81)
RDW: 13.3 % (ref 11.5–15.5)
WBC: 10.1 10*3/uL (ref 4.0–10.5)

## 2017-01-29 LAB — BASIC METABOLIC PANEL
ANION GAP: 11 (ref 5–15)
BUN: 13 mg/dL (ref 6–20)
CALCIUM: 7.8 mg/dL — AB (ref 8.9–10.3)
CO2: 20 mmol/L — AB (ref 22–32)
CREATININE: 0.63 mg/dL (ref 0.61–1.24)
Chloride: 106 mmol/L (ref 101–111)
GFR calc Af Amer: >60 mL/min (ref >60)
GLUCOSE: 96 mg/dL (ref 65–99)
Potassium: 3.1 mmol/L — ABNORMAL LOW (ref 3.5–5.1)
Sodium: 137 mmol/L (ref 135–145)

## 2017-01-29 LAB — AFP TUMOR MARKER: AFP TUMOR MARKER: 247.6 ng/mL — AB (ref 0.0–8.3)

## 2017-01-29 MED ORDER — HYDRALAZINE HCL 20 MG/ML IJ SOLN
20.0000 mg | INTRAMUSCULAR | Status: DC | PRN
  Administered 2017-01-29 – 2017-02-04 (×2): 20 mg via INTRAVENOUS
  Filled 2017-01-29 (×3): qty 1

## 2017-01-29 MED ORDER — HYDRALAZINE HCL 20 MG/ML IJ SOLN
10.0000 mg | INTRAMUSCULAR | Status: DC | PRN
  Administered 2017-01-29: 10 mg via INTRAVENOUS
  Filled 2017-01-29: qty 1

## 2017-01-29 MED ORDER — SODIUM CHLORIDE 0.9 % IV SOLN
30.0000 meq | Freq: Once | INTRAVENOUS | Status: AC
  Administered 2017-01-29: 30 meq via INTRAVENOUS
  Filled 2017-01-29: qty 15

## 2017-01-29 NOTE — Progress Notes (Signed)
Pt has been more lethargic since Seroquel and Klonopin admin, orientation level unchanged but requiring more stimulation for verbal responses.  Neurosurgery made aware.  Pt BP also difficult to manage today despite 2 doses PRN Hydralazine.  BP parameters liberalized to 180 per MD.
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
## 2017-01-29 NOTE — Progress Notes (Signed)
Patient ID: Kyle Mejia, male   DOB: [birth year], 61 y.o.   MRN: [id number] Awake alert, verbalizes, MAEx4, seems stable, denies headache

## 2017-01-30 DIAGNOSIS — I6200 Nontraumatic subdural hemorrhage, unspecified: Secondary | ICD-10-CM

## 2017-01-30 DIAGNOSIS — S069X9A Unspecified intracranial injury with loss of consciousness of unspecified duration, initial encounter: Secondary | ICD-10-CM

## 2017-01-30 DIAGNOSIS — I609 Nontraumatic subarachnoid hemorrhage, unspecified: Secondary | ICD-10-CM

## 2017-01-30 DIAGNOSIS — I619 Nontraumatic intracerebral hemorrhage, unspecified: Secondary | ICD-10-CM

## 2017-01-30 DIAGNOSIS — R0602 Shortness of breath: Secondary | ICD-10-CM

## 2017-01-30 DIAGNOSIS — S069XAA Unspecified intracranial injury with loss of consciousness status unknown, initial encounter: Secondary | ICD-10-CM

## 2017-01-30 MED ORDER — POTASSIUM CHLORIDE 20 MEQ/15ML (10%) PO SOLN
40.0000 meq | Freq: Two times a day (BID) | ORAL | Status: DC
  Administered 2017-01-30 – 2017-02-09 (×19): 40 meq via ORAL
  Filled 2017-01-30 (×21): qty 30

## 2017-01-30 MED ORDER — POTASSIUM CHLORIDE IN NACL 40-0.9 MEQ/L-% IV SOLN
INTRAVENOUS | Status: DC
  Administered 2017-01-30 – 2017-02-04 (×7): 75 mL/h via INTRAVENOUS
  Filled 2017-01-30 (×13): qty 1000

## 2017-01-30 MED ORDER — QUETIAPINE FUMARATE ER 50 MG PO TB24
50.0000 mg | ORAL_TABLET | Freq: Every day | ORAL | Status: DC
  Administered 2017-01-30 – 2017-02-02 (×4): 50 mg via ORAL
  Filled 2017-01-30 (×5): qty 1

## 2017-01-30 MED ORDER — CLONAZEPAM 0.5 MG PO TABS: 0.5000 mg | ORAL_TABLET | Freq: Every day | ORAL | Status: DC
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
## 2017-01-30 NOTE — Progress Notes (Signed)
Patient ID: Kyle Mejia, male   DOB: 31-Mar-1956, 61 y.o.   MRN: [id number] Subjective: Patient reports some headache, some back pain  Objective: Vital signs in last 24 hours: Temp:  [98.4 F (36.9 C)-102.6 F (39.2 C)] 98.8 F (37.1 C) (03/11 0800) Pulse Rate:  [81-115] 94 (03/11 0800) Resp:  [17-34] 29 (03/11 0800) BP: (123-167)/(57-83) 157/83 (03/11 0800) SpO2:  [95 %-99 %] 99 % (03/11 0800)  Intake/Output from previous day: 03/10 0701 - 03/11 0700 In: 1875 [I.V.:1875] Out: 1125 [Urine:1125] Intake/Output this shift: Total I/O In: 75 [I.V.:75] Out: -   awake, verbalizes, maex4 to command  Lab Results: Lab Results  Component Value Date   WBC 10.1 01/29/2017   HGB 10.5 (L) 01/29/2017   HCT 30.2 (L) 01/29/2017   MCV 91.8 01/29/2017   PLT 114 (L) 01/29/2017   Lab Results  Component Value Date   INR 1.16 01/24/2017   BMET Lab Results  Component Value Date   NA 137 01/29/2017   K 3.3 (L) 01/29/2017   CL 109 01/29/2017   CO2 20 (L) 01/29/2017   GLUCOSE 115 (H) 01/29/2017   BUN 10 01/29/2017   CREATININE 0.63 01/29/2017   CALCIUM 7.8 (L) 01/29/2017    Studies/Results: Dg Chest Port 1 View  Result Date: 01/29/2017 CLINICAL DATA:  Shortness of breath EXAM: PORTABLE CHEST 1 VIEW COMPARISON:  01/24/2017; chest CT - 01/24/2017 FINDINGS: Grossly unchanged enlarged cardiac silhouette and mediastinal contours. Mild pulmonary venous congestion without frank evidence of edema. No focal airspace opacities. No pleural effusion or pneumothorax. No acute osseus abnormalities. IMPRESSION: Cardiomegaly and mild pulmonary venous congestion without superimposed acute cardiopulmonary disease on this AP portable examination. Further evaluation with a PA and lateral chest radiograph may be obtained as clinically indicated. Electronically Signed   By: Sandi Mariscal M.D.   On: 01/29/2017 11:34    Assessment/Plan: CCC, stable   LOS: 6 days    [place] Behlke S 01/30/2017, 9:30  AM

## 2017-01-31 LAB — BASIC METABOLIC PANEL
ANION GAP: 9 (ref 5–15)
BUN: 8 mg/dL (ref 6–20)
CO2: 21 mmol/L — ABNORMAL LOW (ref 22–32)
Calcium: 8.3 mg/dL — ABNORMAL LOW (ref 8.9–10.3)
Chloride: 105 mmol/L (ref 101–111)
Creatinine, Ser: 0.59 mg/dL — ABNORMAL LOW (ref 0.61–1.24)
GFR calc Af Amer: >60 mL/min (ref >60)
Glucose, Bld: 127 mg/dL — ABNORMAL HIGH (ref 65–99)
POTASSIUM: 4.3 mmol/L (ref 3.5–5.1)
SODIUM: 135 mmol/L (ref 135–145)

## 2017-01-31 MED ORDER — CLONAZEPAM 0.5 MG PO TABS
0.2500 mg | ORAL_TABLET | Freq: Every day | ORAL | Status: DC
  Administered 2017-01-31 – 2017-02-08 (×8): 0.25 mg via ORAL
  Filled 2017-01-31 (×8): qty 1

## 2017-01-31 NOTE — Progress Notes (Signed)
Patient ID: Kyle Mejia, male   DOB: [birth year], 61 y.o.   MRN: [id number] Vital signs are stable Patient has variable level of consciousness with sometimes alert following commands today he is somewhat somnolent but will arouse to voice and answers appropriately. Continue supportive care Will need rehabilitation
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
## 2017-01-31 NOTE — Progress Notes (Addendum)
Briefly met with pt at bedside as he is up in chair. He was able to tell me his daughter's name, Loree Fee and girlfriend's name, Jenny Reichmann. I spoke with Whitney by phone to begin discussions of pt's need for an inpt rehab admission pending his progress and bed availability when pt medically ready. She states her Mom, Jenny Reichmann, can provide 24/7 care of pt with her at d.c. I will follow pt's progress. Other rehab venues need to be explored also due to bed availability issues . [phone]  I received phone call from pt's girlfriend, Jenny Reichmann, who states she is unable to care for pt at home at d/c. She has stage 4 COPD, and can not provide physical care that is anticipated he will need. Pt has two sister in [place] in their 27s who Jenny Reichmann is keeping up to date on his needs, but they are also unable to offer care. I explained to Jenny Reichmann that pt will be SNF rehab and [place]. I will alert RN CM and SW. We will sign off. [phone]

## 2017-01-31 NOTE — Progress Notes (Signed)
Pt states he needs to void, unable to void at this time. Bladder Scanned patient for 776 ml. I & O cathed per Md order and Hospital protocol with Jani Gravel RN assistance, for approx 600 ml clear amber urine. Patient resting tolerated well

## 2017-02-01 MED ORDER — RESOURCE THICKENUP CLEAR PO POWD
ORAL | Status: DC | PRN
  Filled 2017-02-01: qty 125

## 2017-02-01 MED ORDER — ORAL CARE MOUTH RINSE
15.0000 mL | Freq: Two times a day (BID) | OROMUCOSAL | Status: DC
  Administered 2017-02-01 – 2017-02-08 (×13): 15 mL via OROMUCOSAL
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
## 2017-02-01 NOTE — Progress Notes (Signed)
Patient ID: Kyle Mejia, male   DOB: 03/28/1956, 61 y.o.   MRN: [id number] Vital signs are stable Patient is much more alert today Following commands Somewhat sluggish but definitely improved

## 2017-02-01 NOTE — Progress Notes (Signed)
Pt states he needs to void, unable to void at this time. Bladder Scanned patient for 700 ml. I & O cathed per Md order and Hospital protocol with Callie Fielding  RN assistance, for approx 700 ml clear amber urine. Patient resting tolerated well
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
## 2017-02-01 NOTE — Progress Notes (Signed)
Patient complaining of the need to urinate but unable to do so. RN performed bladder scan which resulted 508cc. RN paged PA and made aware of situation and received orders to place foley. Foley placed with hospital protocol by Erick Blinks, RN and Nigel Mormon, RN. 500cc out of foley on insertion. Patient tolerated well and is now resting.   Basil Dess, RN

## 2017-02-01 NOTE — Progress Notes (Addendum)
Pt states he needs to void, unable to void at this time. Bladder Scanned patient for 700 ml. I & O cathed per Md order and Hospital protocol with Jani Gravel  RN assistance, for approx 800 ml clear amber urine. Patient resting tolerated well
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
## 2017-02-02 ENCOUNTER — Inpatient Hospital Stay (HOSPITAL_COMMUNITY): Payer: Medicaid Other

## 2017-02-02 MED ORDER — ACETAMINOPHEN 325 MG PO TABS
650.0000 mg | ORAL_TABLET | Freq: Four times a day (QID) | ORAL | Status: DC | PRN
  Administered 2017-02-07: 650 mg via ORAL
  Filled 2017-02-02 (×2): qty 2

## 2017-02-02 MED ORDER — TRAMADOL HCL 50 MG PO TABS: 50.0000 mg | ORAL_TABLET | Freq: Four times a day (QID) | ORAL | Status: DC | PRN

## 2017-02-02 MED ORDER — OXYCODONE HCL 5 MG PO TABS
5.0000 mg | ORAL_TABLET | ORAL | Status: DC | PRN
  Administered 2017-02-03 – 2017-02-09 (×5): 5 mg via ORAL
  Filled 2017-02-02 (×5): qty 1

## 2017-02-02 NOTE — Progress Notes (Signed)
Patient ID: Kyle Mejia, male   DOB: 1956-10-04, 61 y.o.   MRN: [id number] Arousable and alerts to voice. Follows commands. Rehabilitation from significant frontal contusion will likely take some time Will likely need placement in SNF or CIR

## 2017-02-02 NOTE — Clinical Social Work Note (Signed)
PASARR under manual review. H&P, FL2, and 30 day note faxed to [facility] for review as requested.  Dayton Scrape, Max

## 2017-02-02 NOTE — Progress Notes (Signed)
SLP Cancellation Note  Patient Details Name: Kyle Mejia MRN: [id number] DOB: 23-Apr-1956   Cancelled treatment:       Reason Eval/Treat Not Completed: Fatigue/lethargy limiting ability to participate. RN to page SLP if patient becomes more alert this pm.   [place], [place] [phone]    Gabriel Rainwater Meryl 02/02/2017, 10:37 AM
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
## 2017-02-02 NOTE — Progress Notes (Signed)
Trauma Service Note  Subjective: Patient barely arousable.  Got some dilaudid throughout the night.  Objective: Vital signs in last 24 hours: Temp:  [98 F (36.7 C)-99.2 F (37.3 C)] 98.4 F (36.9 C) (03/14 0800) Pulse Rate:  [65-95] 65 (03/14 0800) Resp:  [19-28] 21 (03/14 0800) BP: (137-167)/(58-82) 148/58 (03/14 0800) SpO2:  [90 %-98 %] 96 % (03/14 0800) Last BM Date: 01/29/17  Intake/Output from previous day: 03/13 0701 - 03/14 0700 In: 900 [I.V.:900] Out: 1550 [Urine:1550] Intake/Output this shift: No intake/output data recorded.  General: No distress  Lungs: Clear  Abd: Benign  Extremities: No chagnes.  Left elbow laceration repaired draining some serous fluid.  Sutures can be removed.  Neuro: GCS 13.  Moves all fours.  Get agitated when not somnolent  Lab Results:  BMET  Recent Labs  01/31/17 0314  NA 135  K 4.3  CL 105  CO2 21*  GLUCOSE 127*  BUN 8  CREATININE 0.59*  CALCIUM 8.3*   PT/INR No results for input(s): LABPROT, INR in the last 72 hours. ABG No results for input(s): PHART, HCO3 in the last 72 hours.  Invalid input(s): PCO2, PO2  Studies/Results: No results found.  Anti-infectives: Anti-infectives    None      Assessment/Plan: s/p  Change pain medications.  LOS: 9 days   Kathryne Eriksson. Dahlia Bailiff, MD, FACS [phone] Trauma Surgeon 02/02/2017

## 2017-02-03 ENCOUNTER — Inpatient Hospital Stay (HOSPITAL_COMMUNITY): Payer: Medicaid Other

## 2017-02-03 LAB — BASIC METABOLIC PANEL
ANION GAP: 8 (ref 5–15)
BUN: 9 mg/dL (ref 6–20)
CALCIUM: 8.5 mg/dL — AB (ref 8.9–10.3)
CO2: 22 mmol/L (ref 22–32)
Chloride: 99 mmol/L — ABNORMAL LOW (ref 101–111)
Creatinine, Ser: 0.55 mg/dL — ABNORMAL LOW (ref 0.61–1.24)
GFR calc Af Amer: >60 mL/min (ref >60)
GFR calc non Af Amer: >60 mL/min (ref >60)
GLUCOSE: 100 mg/dL — AB (ref 65–99)
POTASSIUM: 4.1 mmol/L (ref 3.5–5.1)
Sodium: 129 mmol/L — ABNORMAL LOW (ref 135–145)

## 2017-02-03 MED ORDER — BISACODYL 10 MG RE SUPP: 10.0000 mg | Freq: Every day | RECTAL | Status: DC | PRN

## 2017-02-03 MED ORDER — DOCUSATE SODIUM 50 MG/5ML PO LIQD
100.0000 mg | Freq: Every day | ORAL | Status: DC
  Administered 2017-02-03 – 2017-02-09 (×7): 100 mg via ORAL
  Filled 2017-02-03 (×6): qty 10

## 2017-02-03 MED ORDER — POLYETHYLENE GLYCOL 3350 17 G PO PACK: 17.0000 g | PACK | Freq: Two times a day (BID) | ORAL | Status: DC | PRN

## 2017-02-03 MED ORDER — METOPROLOL TARTRATE 50 MG PO TABS
50.0000 mg | ORAL_TABLET | Freq: Two times a day (BID) | ORAL | Status: DC
  Administered 2017-02-03 – 2017-02-04 (×4): 50 mg via ORAL
  Administered 2017-02-05: 25 mg via ORAL
  Filled 2017-02-03 (×4): qty 1

## 2017-02-03 MED ORDER — DOCUSATE SODIUM 50 MG/5ML PO LIQD
100.0000 mg | Freq: Every day | ORAL | Status: DC
  Filled 2017-02-03: qty 10

## 2017-02-03 MED ORDER — PANTOPRAZOLE SODIUM 40 MG PO PACK
40.0000 mg | PACK | Freq: Every day | ORAL | Status: DC
  Administered 2017-02-03 – 2017-02-09 (×7): 40 mg
  Filled 2017-02-03 (×8): qty 20

## 2017-02-03 MED ORDER — LORAZEPAM 2 MG/ML IJ SOLN
1.0000 mg | Freq: Once | INTRAMUSCULAR | Status: AC
  Administered 2017-02-04: 1 mg via INTRAVENOUS
  Filled 2017-02-03: qty 1

## 2017-02-03 NOTE — Progress Notes (Signed)
Patient ID: Kyle Mejia, male   DOB: 1955/12/04, 61 y.o.   MRN: [id number] Vital signs are stable Patient is more alert today Last scan on March 9. We'll order new CT for follow-up of right frontal contusion and interests cerebral a small subdural hygroma of opposite side.Marland Kitchen
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
## 2017-02-03 NOTE — Plan of Care (Signed)
Problem: Safety: Goal: Ability to remain free from injury will improve Outcome: Progressing Pt has safety mittens on and call bell within reach, bed alarm on
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
## 2017-02-03 NOTE — Progress Notes (Signed)
Modified Barium Swallow Progress Note  Patient Details  Name: Kyle Mejia MRN: [id number] Date of Birth: 05-19-1956  Today's Date: 02/03/2017  Modified Barium Swallow completed.  Full report located under Chart Review in the Imaging Section.  Brief recommendations include the following:  Clinical Impression  Patient presents with moderate oropharygeal dysphagia, primarily due to cognitive deficits and lethargy. Patient with flucutating levels of alertness resulting in intermittent oral holding and an increase in swallow iniitation delay whicn when combined with midlly decreased laryngeal closure, results in deep penetration of thin liquids occurring before and during the swallow. Penetration sensed and patient reponds with throat clear however inconsistently successful to fully clear they laryngeal vestibule. USe of cup sips with SLP assistance for bolus size control, penetration episodes significantly reduced. Although nectar thick liquids not penetrated initiatlly, patient with mild post swallow residuals which were intermittently penetrated post swallow. Recommend dysphagia 1, thin liquids, no straws to decrease aspiration risk.    Swallow Evaluation Recommendations       SLP Diet Recommendations: Dysphagia 1 (Puree) solids;Thin liquid   Liquid Administration via: Cup;No straw   Medication Administration: Crushed with puree   Supervision: Patient able to self feed;Staff to assist with self feeding;Full supervision/cueing for compensatory strategies   Compensations: Slow rate;Small sips/bites;Minimize environmental distractions;Lingual sweep for clearance of pocketing   Postural Changes: Seated upright at 90 degrees   Oral Care Recommendations: Oral care BID      Gabriel Rainwater MA, CCC-SLP [phone]   Kyle Mejia 02/03/2017,1:37 PM
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
## 2017-02-04 ENCOUNTER — Inpatient Hospital Stay (HOSPITAL_COMMUNITY): Payer: Medicaid Other

## 2017-02-04 LAB — GLUCOSE, CAPILLARY: Glucose-Capillary: 120 mg/dL — ABNORMAL HIGH (ref 65–99)

## 2017-02-04 MED ORDER — LORAZEPAM 2 MG/ML IJ SOLN
0.5000 mg | Freq: Once | INTRAMUSCULAR | Status: AC | PRN
  Administered 2017-02-04: 0.5 mg via INTRAVENOUS
  Filled 2017-02-04: qty 1

## 2017-02-04 MED ORDER — BETHANECHOL CHLORIDE 25 MG PO TABS
25.0000 mg | ORAL_TABLET | Freq: Three times a day (TID) | ORAL | Status: DC
  Administered 2017-02-04 – 2017-02-09 (×16): 25 mg via ORAL
  Filled 2017-02-04 (×16): qty 1

## 2017-02-04 MED ORDER — CLONIDINE HCL 0.1 MG PO TABS
0.2000 mg | ORAL_TABLET | Freq: Two times a day (BID) | ORAL | Status: DC
  Administered 2017-02-04 – 2017-02-09 (×10): 0.2 mg via ORAL
  Filled 2017-02-04 (×10): qty 2

## 2017-02-04 MED ORDER — LORAZEPAM 0.5 MG PO TABS: 0.5000 mg | ORAL_TABLET | Freq: Once | ORAL | Status: AC | PRN

## 2017-02-04 NOTE — Progress Notes (Signed)
Neurosurgery MD paged per trauma instruction.

## 2017-02-04 NOTE — Progress Notes (Signed)
CT obtained, results pending. Patient RR 40, shallow with snore. Patient CBG 120. Patient respiratory status reported to MD. Per Dr. Saintclair Halsted stat CXR and ABG ordered.
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
## 2017-02-04 NOTE — Significant Event (Signed)
Rapid Response Event Note  Overview:  Pt seen while rounding earlier in night. RN was in process of paging neurosurgeon for possible orders concerning neuro changes. Later in night, RRT called by bedside RN concerning pts neuro status and snoring respirations.  RN in CT with patient when RRT called. Time Called: 2237 Arrival Time: 2240 Event Type: Neurologic  Initial Focused Assessment: Pt thrashing on CT table, not following commands or verbalizing(unchanged from previous assessment while rounding earlier). Pupils 5 (L sluggish, R brisk),  Pt moving all extremities. Skin warm and dry.  Patient with snoring respirations, tachypneic.VS: HR-93, BP-131/78, RR-30, SpO2-98 on RA, T-98.7 axillary. CBG-120.    Interventions: Pt given 0.5mg  ativan for CT scan. CT done, pt transported back to room.   Plan of Care (if not transferred): Monitor pt.  Followup with Dr. Saintclair Halsted regarding CT results. Call RRT if assistance needed.   Event Summary: Name of Physician Notified: Dr. Saintclair Halsted at  (PTA RRT)    at          [place], Carren Rang

## 2017-02-04 NOTE — Progress Notes (Signed)
Patient restlessness increased, patient states h/a worse. Patient RR 40. MD paged.

## 2017-02-04 NOTE — Progress Notes (Signed)
Pt observed to be restless and agitated with arms and leg swinging, o2 sat 98% on room air, BP 172/85, Dr Redmond Pulling (on call) paged and notified, ordered iv Ativan 1mg , same given at [place], pt reassured, will however continue to monitor. Kyle Mejia, Kyle Mejia
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
## 2017-02-04 NOTE — Progress Notes (Signed)
Patient very restless during bedside report. Patient continuously rubbing head, patient nodded "yes" when asked if his head hurt. Patient pupils 5 mm, left pupil sluggish. BP obtained.  I20 mg IV hydralazine administered. Patients SBP goal < 140. 5 mg OXY IR PO administered for patients h/a. RN will continue to monitor closely. RR RN rounded on patient.  Family called inquiring on patient status. RN will return call .

## 2017-02-04 NOTE — Progress Notes (Signed)
Patient ID: Kyle Mejia, male   DOB: 06/09/1956, 61 y.o.   MRN: [id number] Arousable but less so than yesterday Speech eval noted CT head yesterday shows resolution of sub dural hygroma with contusions in middle of resolution with surrounding edema Continues supportive care Contusions will need to resolve with time.

## 2017-02-04 NOTE — Progress Notes (Signed)
STAT CT, 0.2 mg clonidine PO BID, 0.5 ativan PO or IV once PRN for imaging per Kary Kos

## 2017-02-04 NOTE — Progress Notes (Signed)
RN transporting patient for CT. CN notified. CT results to be reported to Dr Saintclair Halsted. RN will continue to monitor.

## 2017-02-05 ENCOUNTER — Inpatient Hospital Stay (HOSPITAL_COMMUNITY): Payer: Medicaid Other

## 2017-02-05 LAB — BASIC METABOLIC PANEL
ANION GAP: 7 (ref 5–15)
BUN: 14 mg/dL (ref 6–20)
CHLORIDE: 99 mmol/L — AB (ref 101–111)
CO2: 21 mmol/L — AB (ref 22–32)
CREATININE: 0.54 mg/dL — AB (ref 0.61–1.24)
Calcium: 8.5 mg/dL — ABNORMAL LOW (ref 8.9–10.3)
GFR calc non Af Amer: >60 mL/min (ref >60)
Glucose, Bld: 118 mg/dL — ABNORMAL HIGH (ref 65–99)
POTASSIUM: 4.1 mmol/L (ref 3.5–5.1)
SODIUM: 127 mmol/L — AB (ref 135–145)

## 2017-02-05 LAB — BLOOD GAS, ARTERIAL
Acid-base deficit: 0.6 mmol/L (ref 0.0–2.0)
BICARBONATE: 22.4 mmol/L (ref 20.0–28.0)
Drawn by: 426241
FIO2: 21
O2 Saturation: 96.1 %
PATIENT TEMPERATURE: 98.6
PCO2 ART: 29.4 mmHg — AB (ref 32.0–48.0)
PH ART: 7.494 — AB (ref 7.350–7.450)
PO2 ART: 79 mmHg — AB (ref 83.0–108.0)

## 2017-02-05 MED ORDER — SODIUM CHLORIDE 1 G PO TABS
2.0000 g | ORAL_TABLET | Freq: Three times a day (TID) | ORAL | Status: DC
  Administered 2017-02-05 – 2017-02-09 (×11): 2 g via ORAL
  Filled 2017-02-05 (×15): qty 2

## 2017-02-05 MED ORDER — DEXAMETHASONE SODIUM PHOSPHATE 10 MG/ML IJ SOLN
10.0000 mg | Freq: Four times a day (QID) | INTRAMUSCULAR | Status: AC
  Administered 2017-02-05 (×4): 10 mg via INTRAVENOUS
  Filled 2017-02-05 (×4): qty 1

## 2017-02-05 MED ORDER — POTASSIUM CHLORIDE IN NACL 20-0.9 MEQ/L-% IV SOLN
INTRAVENOUS | Status: DC
  Administered 2017-02-04 – 2017-02-06 (×4): via INTRAVENOUS
  Filled 2017-02-05 (×3): qty 1000

## 2017-02-05 MED ORDER — METOPROLOL TARTRATE 25 MG PO TABS
25.0000 mg | ORAL_TABLET | Freq: Two times a day (BID) | ORAL | Status: DC
  Administered 2017-02-05 – 2017-02-09 (×8): 25 mg via ORAL
  Filled 2017-02-05 (×9): qty 1

## 2017-02-05 NOTE — Progress Notes (Signed)
MD paged for CT results

## 2017-02-05 NOTE — Progress Notes (Signed)
Patient family updated of stat CT and plan going forward. RN spoke with Loma Sousa, patients daughter, which v/u.
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
## 2017-02-06 LAB — BASIC METABOLIC PANEL
ANION GAP: 9 (ref 5–15)
BUN: 27 mg/dL — ABNORMAL HIGH (ref 6–20)
CHLORIDE: 106 mmol/L (ref 101–111)
CO2: 18 mmol/L — AB (ref 22–32)
Calcium: 8.7 mg/dL — ABNORMAL LOW (ref 8.9–10.3)
Creatinine, Ser: 0.58 mg/dL — ABNORMAL LOW (ref 0.61–1.24)
GFR calc non Af Amer: >60 mL/min (ref >60)
Glucose, Bld: 150 mg/dL — ABNORMAL HIGH (ref 65–99)
Potassium: 3.8 mmol/L (ref 3.5–5.1)
SODIUM: 133 mmol/L — AB (ref 135–145)
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
## 2017-02-06 NOTE — Progress Notes (Signed)
Patient ID: Kyle Mejia, male   DOB: May 23, 1956, 61 y.o.   MRN: [id number] Patient much more awake and alert and interactive less agitated  Neurologically nonfocal  Recheck sodium this morning continue to mobilize
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
## 2017-02-07 LAB — GLUCOSE, CAPILLARY: Glucose-Capillary: 87 mg/dL (ref 65–99)
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
## 2017-02-07 NOTE — Progress Notes (Signed)
Patient ID: Kyle Mejia, male   DOB: 06-Mar-1956, 61 y.o.   MRN: [id number] Signs are stable Patient appears reasonably alert He is able to focus his attention for a few brief
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
## 2017-02-07 NOTE — Progress Notes (Signed)
Patient is more alert today during the 7a-7pm shift. Patient alert and able to communicate with staff. Has to be reminded where he is and why. Much calm this shift and not much redirection needed. Restraint needs to be d/c'd as patient is following command. Will continue to monitor.
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
## 2017-02-08 LAB — BASIC METABOLIC PANEL
Anion gap: 8 (ref 5–15)
BUN: 21 mg/dL — AB (ref 6–20)
CO2: 26 mmol/L (ref 22–32)
CREATININE: 0.62 mg/dL (ref 0.61–1.24)
Calcium: 8.9 mg/dL (ref 8.9–10.3)
Chloride: 102 mmol/L (ref 101–111)
GFR calc Af Amer: >60 mL/min (ref >60)
GLUCOSE: 106 mg/dL — AB (ref 65–99)
Potassium: 4 mmol/L (ref 3.5–5.1)
SODIUM: 136 mmol/L (ref 135–145)
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
## 2017-02-09 MED ORDER — CLONAZEPAM 0.5 MG PO TABS: 0.2500 mg | ORAL_TABLET | Freq: Every day | ORAL | 0 refills | Status: DC

## 2017-02-09 MED ORDER — ADULT MULTIVITAMIN W/MINERALS CH: 1.0000 | ORAL_TABLET | Freq: Every day | ORAL | Status: DC

## 2017-02-09 MED ORDER — ORAL CARE MOUTH RINSE: 15.0000 mL | Freq: Two times a day (BID) | OROMUCOSAL | 0 refills | Status: DC

## 2017-02-09 MED ORDER — METOPROLOL TARTRATE 25 MG PO TABS: 25.0000 mg | ORAL_TABLET | Freq: Two times a day (BID) | ORAL | Status: DC

## 2017-02-09 MED ORDER — DOCUSATE SODIUM 50 MG/5ML PO LIQD: 100.0000 mg | Freq: Every day | ORAL | 0 refills | Status: DC

## 2017-02-09 MED ORDER — THIAMINE HCL 100 MG PO TABS: 100.0000 mg | ORAL_TABLET | Freq: Every day | ORAL | Status: DC

## 2017-02-09 MED ORDER — PANTOPRAZOLE SODIUM 40 MG PO PACK: 40.0000 mg | PACK | Freq: Every day | ORAL | Status: DC

## 2017-02-09 MED ORDER — SODIUM CHLORIDE 1 G PO TABS: 2.0000 g | ORAL_TABLET | Freq: Three times a day (TID) | ORAL | Status: DC

## 2017-02-09 MED ORDER — CLONIDINE HCL 0.2 MG PO TABS: 0.2000 mg | ORAL_TABLET | Freq: Two times a day (BID) | ORAL | 11 refills | Status: DC

## 2017-02-09 MED ORDER — ACETAMINOPHEN 325 MG PO TABS: 650.0000 mg | ORAL_TABLET | Freq: Four times a day (QID) | ORAL | Status: DC | PRN

## 2017-02-09 MED ORDER — TAMSULOSIN HCL 0.4 MG PO CAPS: 0.4000 mg | ORAL_CAPSULE | Freq: Every day | ORAL | Status: DC

## 2017-02-09 MED ORDER — FOLIC ACID 1 MG PO TABS: 1.0000 mg | ORAL_TABLET | Freq: Every day | ORAL | Status: DC

## 2017-02-09 NOTE — Progress Notes (Signed)
Called and Verified with [facility], patient awaiting transportation

## 2017-02-09 NOTE — Clinical Social Work Placement (Signed)
   CLINICAL SOCIAL WORK PLACEMENT  NOTE  Date:  02/09/2017  Patient Details  Name: Kyle Mejia MRN: [id number] Date of Birth: 09-Oct-1956  Clinical Social Work is seeking post-discharge placement for this patient at the [place] level of care (*CSW will initial, date and re-position this form in  chart as items are completed):  Yes   Patient/family provided with [place] Work Department's list of facilities offering this level of care within the geographic area requested by the patient (or if unable, by the patient's family).  Yes   Patient/family informed of their freedom to choose among providers that offer the needed level of care, that participate in Medicare, Medicaid or managed care program needed by the patient, have an available bed and are willing to accept the patient.  Yes   Patient/family informed of [place]'s ownership interest in [facility] and [facility], as well as of the fact that they are under no obligation to receive care at these facilities.  PASRR submitted to EDS on 02/02/17     PASRR number received on 02/02/17     Existing PASRR number confirmed on       FL2 transmitted to all facilities in geographic area requested by pt/family on 02/02/17     FL2 transmitted to all facilities within larger geographic area on       Patient informed that his/her managed care company has contracts with or will negotiate with certain facilities, including the following:        Yes   Patient/family informed of bed offers received.  Patient chooses bed at [facility] and [place] recommends and patient chooses bed at      Patient to be transferred to [facility] on 02/09/17.  Patient to be transferred to facility by Ambulance     Patient family notified on 02/09/17 of transfer.  Name of family member notified:  Patient significant other - [name]       Additional  Comment:   [name], [place]

## 2017-02-09 NOTE — Clinical Social Work Note (Signed)
Clinical Social Worker facilitated patient discharge including contacting patient family and facility to confirm patient discharge plans.  Clinical information faxed to facility and family agreeable with plan.  CSW arranged ambulance transport via [facility] to [facility] and [place].  RN to call report prior to discharge.  Clinical Social Worker will sign off for now as social work intervention is no longer needed. Please consult [place] again if new need arises.  [name], Kyle Mejia
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
## 2017-02-09 NOTE — Progress Notes (Signed)
Report called to [facility] and [place]
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
## 2017-02-10 ENCOUNTER — Encounter (HOSPITAL_COMMUNITY): Payer: Self-pay | Admitting: Emergency Medicine

## 2017-02-10 ENCOUNTER — Telehealth: Payer: Self-pay | Admitting: Emergency Medicine

## 2017-02-10 NOTE — Telephone Encounter (Signed)
Facility calling for new patient appointment with Dr Benay Spice; patient is currently at [facility] and [place]; per inpatient note "Of note there was an incidental finding of a liver mass on CT scan; patient has been referred to outpatient oncology and will follow-up with Dr. Benay Spice for this."  Message sent to [facility] for new patient appointment.

## 2017-02-11 ENCOUNTER — Telehealth: Payer: Self-pay | Admitting: Oncology

## 2017-02-11 NOTE — Telephone Encounter (Signed)
An appt has been scheduled for the pt to see Dr. Benay Spice on 4/5 at 2pm. [facility] and left a vm with the appt date and time.

## 2017-02-22 ENCOUNTER — Telehealth: Payer: Self-pay | Admitting: Oncology
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
## 2017-02-24 ENCOUNTER — Ambulatory Visit: Payer: Self-pay | Admitting: Oncology

## 2017-03-08 ENCOUNTER — Telehealth: Payer: Self-pay | Admitting: Oncology

## 2017-03-08 NOTE — Telephone Encounter (Signed)
Pt cld to reschedule appt from 4/5 to 5/1 w/Dr. Benay Spice at 2pm. Pt aware to arrive 30 minutes early. Agreed to the appt date and time.

## 2017-03-10 ENCOUNTER — Encounter (HOSPITAL_COMMUNITY): Payer: Self-pay | Admitting: Emergency Medicine

## 2017-03-10 ENCOUNTER — Emergency Department (HOSPITAL_COMMUNITY): Payer: Medicaid Other

## 2017-03-10 ENCOUNTER — Emergency Department (HOSPITAL_COMMUNITY)
Admission: EM | Admit: 2017-03-10 | Discharge: 2017-03-10 | Disposition: A | Discharge status: Home / Self Care | Payer: Medicaid Other | Attending: Emergency Medicine | Admitting: Emergency Medicine

## 2017-03-10 DIAGNOSIS — X58XXXA Exposure to other specified factors, initial encounter: Secondary | ICD-10-CM | POA: Diagnosis not present

## 2017-03-10 DIAGNOSIS — Y929 Unspecified place or not applicable: Secondary | ICD-10-CM | POA: Diagnosis not present

## 2017-03-10 DIAGNOSIS — Y999 Unspecified external cause status: Secondary | ICD-10-CM | POA: Diagnosis not present

## 2017-03-10 DIAGNOSIS — Y939 Activity, unspecified: Secondary | ICD-10-CM | POA: Insufficient documentation

## 2017-03-10 DIAGNOSIS — I951 Orthostatic hypotension: Secondary | ICD-10-CM | POA: Diagnosis not present

## 2017-03-10 DIAGNOSIS — E860 Dehydration: Secondary | ICD-10-CM | POA: Diagnosis not present

## 2017-03-10 DIAGNOSIS — Z79899 Other long term (current) drug therapy: Secondary | ICD-10-CM | POA: Insufficient documentation

## 2017-03-10 DIAGNOSIS — F17210 Nicotine dependence, cigarettes, uncomplicated: Secondary | ICD-10-CM | POA: Diagnosis not present

## 2017-03-10 DIAGNOSIS — S51012A Laceration without foreign body of left elbow, initial encounter: Secondary | ICD-10-CM | POA: Diagnosis not present

## 2017-03-10 DIAGNOSIS — I10 Essential (primary) hypertension: Secondary | ICD-10-CM | POA: Insufficient documentation

## 2017-03-10 DIAGNOSIS — N3001 Acute cystitis with hematuria: Secondary | ICD-10-CM | POA: Diagnosis not present

## 2017-03-10 DIAGNOSIS — R42 Dizziness and giddiness: Secondary | ICD-10-CM | POA: Insufficient documentation

## 2017-03-10 DIAGNOSIS — S59902A Unspecified injury of left elbow, initial encounter: Secondary | ICD-10-CM | POA: Diagnosis present

## 2017-03-10 LAB — URINALYSIS, ROUTINE W REFLEX MICROSCOPIC
BILIRUBIN URINE: NEGATIVE
GLUCOSE, UA: NEGATIVE mg/dL
Ketones, ur: NEGATIVE mg/dL
LEUKOCYTES UA: NEGATIVE
NITRITE: POSITIVE — AB
PH: 5 (ref 5.0–8.0)
Protein, ur: NEGATIVE mg/dL
SPECIFIC GRAVITY, URINE: 1.018 (ref 1.005–1.030)

## 2017-03-10 LAB — CBC WITH DIFFERENTIAL/PLATELET
Basophils Absolute: 0 10*3/uL (ref 0.0–0.1)
Basophils Relative: 0 %
Eosinophils Absolute: 0 10*3/uL (ref 0.0–0.7)
Eosinophils Relative: 0 %
HCT: 39.9 % (ref 39.0–52.0)
HEMOGLOBIN: 13.4 g/dL (ref 13.0–17.0)
LYMPHS ABS: 1.3 10*3/uL (ref 0.7–4.0)
LYMPHS PCT: 17 %
MCH: 30.6 pg (ref 26.0–34.0)
MCHC: 33.6 g/dL (ref 30.0–36.0)
MCV: 91.1 fL (ref 78.0–100.0)
MONOS PCT: 9 %
Monocytes Absolute: 0.7 10*3/uL (ref 0.1–1.0)
Neutro Abs: 5.5 10*3/uL (ref 1.7–7.7)
Neutrophils Relative %: 73 %
PLATELETS: 113 10*3/uL — AB (ref 150–400)
RBC: 4.38 MIL/uL (ref 4.22–5.81)
RDW: 13.5 % (ref 11.5–15.5)
WBC: 7.6 10*3/uL (ref 4.0–10.5)

## 2017-03-10 LAB — BASIC METABOLIC PANEL
ANION GAP: 6 (ref 5–15)
BUN: 14 mg/dL (ref 6–20)
CALCIUM: 8.4 mg/dL — AB (ref 8.9–10.3)
CO2: 24 mmol/L (ref 22–32)
Chloride: 111 mmol/L (ref 101–111)
Creatinine, Ser: 0.68 mg/dL (ref 0.61–1.24)
Glucose, Bld: 114 mg/dL — ABNORMAL HIGH (ref 65–99)
POTASSIUM: 4.4 mmol/L (ref 3.5–5.1)
SODIUM: 141 mmol/L (ref 135–145)

## 2017-03-10 LAB — CBG MONITORING, ED: Glucose-Capillary: 104 mg/dL — ABNORMAL HIGH (ref 65–99)

## 2017-03-10 MED ORDER — CEPHALEXIN 250 MG PO CAPS
500.0000 mg | ORAL_CAPSULE | Freq: Once | ORAL | Status: AC
  Administered 2017-03-10: 500 mg via ORAL
  Filled 2017-03-10: qty 2

## 2017-03-10 MED ORDER — SODIUM CHLORIDE 0.9 % IV BOLUS (SEPSIS)
1000.0000 mL | Freq: Once | INTRAVENOUS | Status: AC
  Administered 2017-03-10: 1000 mL via INTRAVENOUS

## 2017-03-10 MED ORDER — BACITRACIN ZINC 500 UNIT/GM EX OINT
TOPICAL_OINTMENT | Freq: Once | CUTANEOUS | Status: AC
  Administered 2017-03-10: 1 via TOPICAL
  Filled 2017-03-10: qty 1.8

## 2017-03-10 MED ORDER — SODIUM CHLORIDE 0.9 % IV BOLUS (SEPSIS): 500.0000 mL | Freq: Once | INTRAVENOUS | Status: DC

## 2017-03-10 MED ORDER — CEPHALEXIN 500 MG PO CAPS: 500.0000 mg | ORAL_CAPSULE | Freq: Four times a day (QID) | ORAL | 0 refills | Status: DC
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
## 2017-03-10 NOTE — ED Notes (Signed)
Patient Alert and oriented X4. Stable and ambulatory. Patient verbalized understanding of the discharge instructions.  Patient belongings were taken by the patient. Patient taken by [facility] back to [facility]

## 2017-03-10 NOTE — ED Notes (Signed)
Patient transported to CT 

## 2017-03-10 NOTE — Discharge Instructions (Addendum)
It was my pleasure taking care of you today!  Please take all of your antibiotics until finished! Follow up with your primary care physician early next week for recheck of urine. Return to ER for fevers, vomiting, new/worsening symptoms, any additional concerns.  Increase hydration.  Keep your elbow wound clean and dry. Wash with soap and water at least twice daily. If you notice any signs of infection including redness or warmth around wound or drainage from site, see your primary doctor or return to ER for wound check.

## 2017-03-10 NOTE — ED Notes (Signed)
Attempted to collect UA. Pt stated he could not produce at this time. Urinal at bedside. Will encourage Pt.

## 2017-03-10 NOTE — ED Triage Notes (Signed)
Arrived via EMS from [facility] office. Patient stood up felt lightheaded. No LOC did developed a skin tear left elbow no bleeding at this time. Alert answering and following commands appropriate. States general weakness. EMS reported BP standing 84/Systolic and laying down 62/[id number]. Administered 350ML 0.9 NS repeat BP 106 /76.

## 2017-03-22 ENCOUNTER — Ambulatory Visit (HOSPITAL_BASED_OUTPATIENT_CLINIC_OR_DEPARTMENT_OTHER): Payer: Self-pay | Admitting: Oncology

## 2017-03-22 ENCOUNTER — Telehealth: Payer: Self-pay | Admitting: *Deleted

## 2017-03-22 ENCOUNTER — Telehealth: Payer: Self-pay | Admitting: Oncology

## 2017-03-22 ENCOUNTER — Other Ambulatory Visit: Payer: Self-pay | Admitting: Oncology

## 2017-03-22 VITALS — BP 135/76 | HR 70 | Temp 98.6°F | Resp 18 | Ht 67.0 in | Wt 173.8 lb

## 2017-03-22 DIAGNOSIS — R160 Hepatomegaly, not elsewhere classified: Secondary | ICD-10-CM

## 2017-03-22 DIAGNOSIS — R932 Abnormal findings on diagnostic imaging of liver and biliary tract: Secondary | ICD-10-CM

## 2017-03-22 DIAGNOSIS — Z77018 Contact with and (suspected) exposure to other hazardous metals: Secondary | ICD-10-CM

## 2017-03-22 DIAGNOSIS — F1010 Alcohol abuse, uncomplicated: Secondary | ICD-10-CM

## 2017-03-22 DIAGNOSIS — B1920 Unspecified viral hepatitis C without hepatic coma: Secondary | ICD-10-CM

## 2017-03-22 NOTE — Telephone Encounter (Signed)
"  My boyfriend is scheduled today for an appointment with Dr. Benay Spice.  There's been a problem at the [facility] facility with his transporatation today.  What's the fastest first available appointment?" May 15th is first new patient slot for this provider.   "Can you call me back.  I'm going to check to see if they've left yet to get him  There on time."  Called [facility], "Oval Linsey just called me and they will get him there on time today.  Thanks."

## 2017-03-22 NOTE — Telephone Encounter (Signed)
Gave patient AVS and calender per 5/1 los. Fabby with [facility] to contact patient with Scan appt.
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
## 2017-03-30 ENCOUNTER — Other Ambulatory Visit: Payer: Self-pay

## 2017-04-01 ENCOUNTER — Ambulatory Visit
Admission: RE | Admit: 2017-04-01 | Discharge: 2017-04-01 | Disposition: A | Discharge status: Home / Self Care | Payer: No Typology Code available for payment source | Source: Ambulatory Visit | Attending: Oncology | Admitting: Oncology

## 2017-04-01 DIAGNOSIS — R160 Hepatomegaly, not elsewhere classified: Secondary | ICD-10-CM

## 2017-04-01 DIAGNOSIS — Z77018 Contact with and (suspected) exposure to other hazardous metals: Secondary | ICD-10-CM

## 2017-04-01 MED ORDER — GADOXETATE DISODIUM 0.25 MMOL/ML IV SOLN
8.0000 mL | Freq: Once | INTRAVENOUS | Status: AC | PRN
  Administered 2017-04-01: 8 mL via INTRAVENOUS

## 2017-04-05 ENCOUNTER — Telehealth: Payer: Self-pay | Admitting: Oncology

## 2017-04-05 ENCOUNTER — Ambulatory Visit (HOSPITAL_BASED_OUTPATIENT_CLINIC_OR_DEPARTMENT_OTHER): Payer: Self-pay | Admitting: Oncology

## 2017-04-05 VITALS — BP 128/84 | HR 71 | Temp 98.3°F | Resp 18 | Ht 67.0 in | Wt 171.7 lb

## 2017-04-05 DIAGNOSIS — C220 Liver cell carcinoma: Secondary | ICD-10-CM

## 2017-04-05 NOTE — Telephone Encounter (Signed)
Scheduled appt per 5/15 los. Gave patient AVS and calender per 5/15 los.  
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
## 2017-04-12 ENCOUNTER — Telehealth: Payer: Self-pay | Admitting: Pharmacist

## 2017-04-12 DIAGNOSIS — C220 Liver cell carcinoma: Secondary | ICD-10-CM

## 2017-04-12 NOTE — Telephone Encounter (Signed)
Oral Chemotherapy Pharmacist Encounter  Received notification from Dr. Benay Spice that he plans to start patient on Nexavar for hepatocellular carcinoma, however patient does not have insurance coverage.  I spoke with patient, permission granted to assist in applying for manufacturer assistance to try to receive Nexavar at $0 out of pocket cost.  Labs reviewed, OK for treatment, noted bilirubin 2.2 on 01/29/17 CMET, no dose adjustment necessary  Current medication list in Epic assessed, DDI with Celexa identified: Category C interaction due to both agents with possible QTc prolongation. 03/10/17 EKGs reviewed, QTc 426 msec, stable from previous year Recommend EKG after initiation of Nexavar treatment to ensure QTc remains WNL  Confirmed lab and chemo education appointments for 5/23 with patient. He will sign manufacturer assistance application at that time and either bring income documentation with him or we will develop a plan to get those documents to me to complete his application.  Once application complete, Oral Oncology Clinic will fax to REACH [phone], ph: [phone]).  Oral Oncology Clinic will continue to follow.  Johny Drilling, PharmD, BCPS, BCOP 04/12/2017  4:46 PM Oral Oncology Clinic [phone]

## 2017-04-13 ENCOUNTER — Encounter: Payer: Self-pay | Admitting: *Deleted

## 2017-04-13 ENCOUNTER — Other Ambulatory Visit (HOSPITAL_BASED_OUTPATIENT_CLINIC_OR_DEPARTMENT_OTHER): Payer: Self-pay

## 2017-04-13 ENCOUNTER — Other Ambulatory Visit: Payer: Self-pay

## 2017-04-13 DIAGNOSIS — C220 Liver cell carcinoma: Secondary | ICD-10-CM

## 2017-04-13 LAB — COMPREHENSIVE METABOLIC PANEL
ALBUMIN: 3.3 g/dL — AB (ref 3.5–5.0)
ALT: 35 U/L (ref 0–55)
AST: 36 U/L — AB (ref 5–34)
Alkaline Phosphatase: 159 U/L — ABNORMAL HIGH (ref 40–150)
Anion Gap: 8 mEq/L (ref 3–11)
BILIRUBIN TOTAL: 0.99 mg/dL (ref 0.20–1.20)
BUN: 11.8 mg/dL (ref 7.0–26.0)
CO2: 25 mEq/L (ref 22–29)
Calcium: 9.1 mg/dL (ref 8.4–10.4)
Chloride: 109 mEq/L (ref 98–109)
Creatinine: 0.7 mg/dL (ref 0.7–1.3)
EGFR: >90 mL/min/{1.73_m2} (ref >90)
GLUCOSE: 122 mg/dL (ref 70–140)
POTASSIUM: 4.4 meq/L (ref 3.5–5.1)
SODIUM: 141 meq/L (ref 136–145)
TOTAL PROTEIN: 7 g/dL (ref 6.4–8.3)

## 2017-04-13 LAB — CBC WITH DIFFERENTIAL/PLATELET
BASO%: 0.7 % (ref 0.0–2.0)
Basophils Absolute: 0.1 10*3/uL (ref 0.0–0.1)
EOS%: 3.1 % (ref 0.0–7.0)
Eosinophils Absolute: 0.2 10*3/uL (ref 0.0–0.5)
HCT: 45.1 % (ref 38.4–49.9)
HGB: 15.4 g/dL (ref 13.0–17.1)
LYMPH%: 28.2 % (ref 14.0–49.0)
MCH: 31.4 pg (ref 27.2–33.4)
MCHC: 34.1 g/dL (ref 32.0–36.0)
MCV: 92 fL (ref 79.3–98.0)
MONO#: 0.8 10*3/uL (ref 0.1–0.9)
MONO%: 11.2 % (ref 0.0–14.0)
NEUT%: 56.8 % (ref 39.0–75.0)
NEUTROS ABS: 4 10*3/uL (ref 1.5–6.5)
Platelets: 133 10*3/uL — ABNORMAL LOW (ref 140–400)
RBC: 4.9 10*6/uL (ref 4.20–5.82)
RDW: 13.4 % (ref 11.0–14.6)
WBC: 7.1 10*3/uL (ref 4.0–10.3)
lymph#: 2 10*3/uL (ref 0.9–3.3)

## 2017-04-14 ENCOUNTER — Ambulatory Visit (INDEPENDENT_AMBULATORY_CARE_PROVIDER_SITE_OTHER): Payer: Self-pay | Admitting: Family Medicine

## 2017-04-14 ENCOUNTER — Encounter: Payer: Self-pay | Admitting: Family Medicine

## 2017-04-14 VITALS — BP 134/84 | HR 91 | Temp 98.0°F | Resp 14 | Wt 170.4 lb

## 2017-04-14 DIAGNOSIS — S069X5D Unspecified intracranial injury with loss of consciousness greater than 24 hours with return to pre-existing conscious level, subsequent encounter: Secondary | ICD-10-CM

## 2017-04-14 DIAGNOSIS — R7989 Other specified abnormal findings of blood chemistry: Secondary | ICD-10-CM

## 2017-04-14 DIAGNOSIS — R946 Abnormal results of thyroid function studies: Secondary | ICD-10-CM

## 2017-04-14 DIAGNOSIS — C220 Liver cell carcinoma: Secondary | ICD-10-CM

## 2017-04-14 DIAGNOSIS — R5383 Other fatigue: Secondary | ICD-10-CM

## 2017-04-14 DIAGNOSIS — Z13220 Encounter for screening for lipoid disorders: Secondary | ICD-10-CM

## 2017-04-14 DIAGNOSIS — G4700 Insomnia, unspecified: Secondary | ICD-10-CM

## 2017-04-14 DIAGNOSIS — I10 Essential (primary) hypertension: Secondary | ICD-10-CM

## 2017-04-14 LAB — LIPID PANEL
Cholesterol: 152 mg/dL (ref <200)
HDL: 33 mg/dL — ABNORMAL LOW (ref >40)
LDL CALC: 99 mg/dL (ref <100)
TRIGLYCERIDES: 99 mg/dL (ref <150)
Total CHOL/HDL Ratio: 4.6 Ratio (ref <5.0)
VLDL: 20 mg/dL (ref <30)

## 2017-04-14 LAB — AFP TUMOR MARKER: AFP, SERUM, TUMOR MARKER: 443.9 ng/mL — AB (ref 0.0–8.3)

## 2017-04-14 MED ORDER — SORAFENIB TOSYLATE 200 MG PO TABS: ORAL_TABLET | ORAL | 0 refills | Status: DC

## 2017-04-14 MED ORDER — HYDROXYZINE HCL 50 MG PO TABS: 50.0000 mg | ORAL_TABLET | Freq: Every day | ORAL | 2 refills | Status: DC

## 2017-04-14 MED FILL — hydrOXYzine HCL 50 MG TABS: 50 | 30 days supply | Qty: 30 | Fill #0

## 2017-04-14 NOTE — Telephone Encounter (Signed)
Oral Chemotherapy Pharmacist Encounter  I met patient in chemo education room on 5/23 to complete REACH application. Completed application faxed to REACH at [phone] on 5/23  This encounter will continue to be updated until final determination.  Oral Oncology Clinic will continue to follow.   Jesse , PharmD, BCPS, BCOP 04/14/2017  10:32 AM Oral Oncology Clinic [phone]  

## 2017-04-14 NOTE — Patient Instructions (Signed)
I have referred you to neurology and prescribed Hydroxyzine 50 mg at bedtime for sleep.
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
## 2017-04-15 LAB — THYROID PANEL WITH TSH
FREE THYROXINE INDEX: 3.7 (ref 1.4–3.8)
T3 UPTAKE: 20 % — AB (ref 22–35)
T4 TOTAL: 18.3 ug/dL — AB (ref 4.5–12.0)
TSH: 2.23 m[IU]/L (ref 0.40–4.50)

## 2017-04-15 LAB — HEMOGLOBIN A1C
HEMOGLOBIN A1C: 4.9 % (ref <5.7)
Mean Plasma Glucose: 94 mg/dL

## 2017-04-20 NOTE — Progress Notes (Signed)
Nutrition  Patient identified on Malnutrition Screening Report for weight loss and poor appetite.  Left message on home voicemail for patient to return call.  Suhaas Agena B. Zenia Resides, [place], [place] Registered Dietitian [phone] (pager)
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
## 2017-04-20 NOTE — Telephone Encounter (Signed)
Oral Chemotherapy Pharmacist Encounter  I called the REACH program at [phone] to follow-up on application for Nexavar. They will need a 2nd bank statement to use as proof of income (1 bank statement was sent with original application). I called and spoke with patient's partner, Jenny Reichmann. She will bring in requested bank statement tomorrow (5/31) before lunch. Once received, I will fax it to the REACH program at [phone]. Case ID: [id number] will be put on cover sheet.  This encounter will continue to be updated until final determination.  Oral Oncology Clinic will continue to follow.   Johny Drilling, PharmD, BCPS, BCOP 04/20/2017  4:45 PM Oral Oncology Clinic [phone]

## 2017-04-21 NOTE — Telephone Encounter (Signed)
Oral Chemotherapy Pharmacist Encounter  Bank statement received and faxed to REACH at [phone]. Case ID: [id number] and name/DOB of patient put on cover sheet.  This encounter will continue to be updated until final determination.  Oral Oncology Clinic will continue to follow.   Johny Drilling, PharmD, BCPS, BCOP 04/21/2017  4:03 PM Oral Oncology Clinic [phone]

## 2017-04-25 NOTE — Addendum Note (Signed)
Addended by: Scot Jun on: 04/25/2017 10:00 PM   Modules accepted: Orders

## 2017-05-03 ENCOUNTER — Ambulatory Visit (HOSPITAL_BASED_OUTPATIENT_CLINIC_OR_DEPARTMENT_OTHER): Payer: Self-pay | Admitting: Nurse Practitioner

## 2017-05-03 ENCOUNTER — Other Ambulatory Visit (HOSPITAL_BASED_OUTPATIENT_CLINIC_OR_DEPARTMENT_OTHER): Payer: Self-pay

## 2017-05-03 VITALS — BP 119/67 | HR 72 | Temp 98.6°F | Resp 18 | Ht 67.0 in | Wt 167.0 lb

## 2017-05-03 DIAGNOSIS — C220 Liver cell carcinoma: Secondary | ICD-10-CM

## 2017-05-03 DIAGNOSIS — B1920 Unspecified viral hepatitis C without hepatic coma: Secondary | ICD-10-CM

## 2017-05-03 LAB — CBC WITH DIFFERENTIAL/PLATELET
BASO%: 0.6 % (ref 0.0–2.0)
Basophils Absolute: 0.1 10*3/uL (ref 0.0–0.1)
EOS%: 2.5 % (ref 0.0–7.0)
Eosinophils Absolute: 0.2 10*3/uL (ref 0.0–0.5)
HCT: 47.7 % (ref 38.4–49.9)
HEMOGLOBIN: 16.3 g/dL (ref 13.0–17.1)
LYMPH#: 2.7 10*3/uL (ref 0.9–3.3)
LYMPH%: 31.2 % (ref 14.0–49.0)
MCH: 31.3 pg (ref 27.2–33.4)
MCHC: 34.2 g/dL (ref 32.0–36.0)
MCV: 91.7 fL (ref 79.3–98.0)
MONO#: 0.8 10*3/uL (ref 0.1–0.9)
MONO%: 9.6 % (ref 0.0–14.0)
NEUT%: 56.1 % (ref 39.0–75.0)
NEUTROS ABS: 4.9 10*3/uL (ref 1.5–6.5)
Platelets: 152 10*3/uL (ref 140–400)
RBC: 5.2 10*6/uL (ref 4.20–5.82)
RDW: 13.1 % (ref 11.0–14.6)
WBC: 8.7 10*3/uL (ref 4.0–10.3)

## 2017-05-03 LAB — COMPREHENSIVE METABOLIC PANEL
ALBUMIN: 3.3 g/dL — AB (ref 3.5–5.0)
ALT: 24 U/L (ref 0–55)
AST: 25 U/L (ref 5–34)
Alkaline Phosphatase: 133 U/L (ref 40–150)
Anion Gap: 9 mEq/L (ref 3–11)
BUN: 16 mg/dL (ref 7.0–26.0)
CALCIUM: 9.9 mg/dL (ref 8.4–10.4)
CO2: 27 mEq/L (ref 22–29)
CREATININE: 0.8 mg/dL (ref 0.7–1.3)
Chloride: 107 mEq/L (ref 98–109)
EGFR: >90 mL/min/{1.73_m2} (ref >90)
GLUCOSE: 105 mg/dL (ref 70–140)
POTASSIUM: 4.7 meq/L (ref 3.5–5.1)
SODIUM: 144 meq/L (ref 136–145)
Total Bilirubin: 0.67 mg/dL (ref 0.20–1.20)
Total Protein: 7.3 g/dL (ref 6.4–8.3)

## 2017-05-03 MED ORDER — PROCHLORPERAZINE MALEATE 5 MG PO TABS: 5.0000 mg | ORAL_TABLET | Freq: Four times a day (QID) | ORAL | 0 refills | Status: DC | PRN
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
## 2017-05-09 NOTE — Telephone Encounter (Signed)
Oral Chemotherapy Pharmacist Encounter  Received notification from [facility] program that patient has been successfully enrolled into their program to receive Nexavar at $0 out of pocket cost from the manufacturer. Effective dates: 04/28/17-04/28/18  REACH program phone: [phone], fax: [phone]  Patient has been notified and Nexavar start date 05/04/17 per MD note.  Oral Oncology Clinic will continue to follow.  Johny Drilling, PharmD, BCPS, BCOP 05/09/2017  1:16 PM Oral Oncology Clinic [phone]

## 2017-05-16 ENCOUNTER — Telehealth: Payer: Self-pay

## 2017-05-16 MED ORDER — FOLIC ACID 1 MG PO TABS: 1.0000 mg | ORAL_TABLET | Freq: Every day | ORAL | 2 refills | Status: DC

## 2017-05-16 MED ORDER — LISINOPRIL 10 MG PO TABS: ORAL_TABLET | ORAL | 2 refills | Status: DC

## 2017-05-16 MED ORDER — CLONIDINE HCL 0.2 MG PO TABS: 0.2000 mg | ORAL_TABLET | Freq: Two times a day (BID) | ORAL | 11 refills | Status: DC

## 2017-05-16 MED FILL — hydrOXYzine HCL 50 MG TABS: 50 | 30 days supply | Qty: 30 | Fill #1

## 2017-05-16 NOTE — Telephone Encounter (Signed)
Ok to refill Lisinopril, Clonidine, Folic Acid.  Advise patient that I did not evaluate him for any condition requiring Flomax, therefore I cannot refill medication.  His Klonopin was discontinued upon discharge from this rehabilitation facility. No refills will be provided for this medication. If he is experiencing anxiety, he will need to schedule a visit.

## 2017-05-16 NOTE — Telephone Encounter (Signed)
Patient notified of medication refills.

## 2017-05-18 ENCOUNTER — Other Ambulatory Visit (HOSPITAL_BASED_OUTPATIENT_CLINIC_OR_DEPARTMENT_OTHER): Payer: Medicaid Other

## 2017-05-18 ENCOUNTER — Ambulatory Visit (HOSPITAL_BASED_OUTPATIENT_CLINIC_OR_DEPARTMENT_OTHER): Payer: Medicaid Other | Admitting: Nurse Practitioner

## 2017-05-18 VITALS — BP 142/93 | HR 96 | Temp 98.2°F | Resp 18 | Ht 67.0 in | Wt 165.5 lb

## 2017-05-18 DIAGNOSIS — C220 Liver cell carcinoma: Secondary | ICD-10-CM | POA: Diagnosis present

## 2017-05-18 LAB — CBC WITH DIFFERENTIAL/PLATELET
BASO%: 0.7 % (ref 0.0–2.0)
Basophils Absolute: 0.1 10*3/uL (ref 0.0–0.1)
EOS ABS: 0.2 10*3/uL (ref 0.0–0.5)
EOS%: 2.6 % (ref 0.0–7.0)
HCT: 50 % — ABNORMAL HIGH (ref 38.4–49.9)
HEMOGLOBIN: 17 g/dL (ref 13.0–17.1)
LYMPH%: 27.5 % (ref 14.0–49.0)
MCH: 30.6 pg (ref 27.2–33.4)
MCHC: 34 g/dL (ref 32.0–36.0)
MCV: 89.9 fL (ref 79.3–98.0)
MONO#: 0.7 10*3/uL (ref 0.1–0.9)
MONO%: 9 % (ref 0.0–14.0)
NEUT%: 60.2 % (ref 39.0–75.0)
NEUTROS ABS: 4.9 10*3/uL (ref 1.5–6.5)
Platelets: 121 10*3/uL — ABNORMAL LOW (ref 140–400)
RBC: 5.57 10*6/uL (ref 4.20–5.82)
RDW: 13.6 % (ref 11.0–14.6)
WBC: 8.1 10*3/uL (ref 4.0–10.3)
lymph#: 2.2 10*3/uL (ref 0.9–3.3)

## 2017-05-18 LAB — COMPREHENSIVE METABOLIC PANEL
ALBUMIN: 3.4 g/dL — AB (ref 3.5–5.0)
ALK PHOS: 143 U/L (ref 40–150)
ALT: 54 U/L (ref 0–55)
AST: 48 U/L — ABNORMAL HIGH (ref 5–34)
Anion Gap: 10 mEq/L (ref 3–11)
BUN: 20.1 mg/dL (ref 7.0–26.0)
CO2: 26 meq/L (ref 22–29)
CREATININE: 0.7 mg/dL (ref 0.7–1.3)
Calcium: 9.5 mg/dL (ref 8.4–10.4)
Chloride: 109 mEq/L (ref 98–109)
GLUCOSE: 86 mg/dL (ref 70–140)
Potassium: 4.7 mEq/L (ref 3.5–5.1)
SODIUM: 145 meq/L (ref 136–145)
Total Bilirubin: 0.88 mg/dL (ref 0.20–1.20)
Total Protein: 7.5 g/dL (ref 6.4–8.3)
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
## 2017-05-18 NOTE — Patient Instructions (Signed)
Sorafenib Oral Tablet  What is this medicine?  SORAFENIB (soe RAF e nib) is a medicine that targets proteins in cancer cells and stops the cancer cells from growing. It is used to treat liver cancer, kidney cancer, and thyroid cancer.  This medicine may be used for other purposes; ask your health care provider or pharmacist if you have questions.  COMMON BRAND NAME(S): Nexavar  What should I tell my health care provider before I take this medicine?  They need to know if you have any of these conditions:  -bleeding problems  -heart disease  -high blood pressure  -kidney disease  -liver disease  -lung cancer  -recent surgery  -an unusual or allergic reaction to sorafenib, other medicines, foods, dyes, or preservatives  -pregnant or trying to get pregnant  -breast-feeding  How should I use this medicine?  Take this medicine by mouth with a glass of water. Follow the directions on the prescription label. Do not cut, crush or chew this medicine. Take this medicine on an empty stomach, at least 1 hour before or 2 hours after meals. Do not take with food. Take your medicine at regular intervals. Do not take it more often than directed. Do not stop taking except on your doctor's advice.  Talk to your pediatrician regarding the use of this medicine in children. Special care may be needed.  Overdosage: If you think you have taken too much of this medicine contact a poison control center or emergency room at once.  NOTE: This medicine is only for you. Do not share this medicine with others.  What if I miss a dose?  If you miss a dose, take it as soon as you can. If it is almost time for your next dose, take only that dose. Do not take double or extra doses.  What may interact with this medicine?  This medicine may interact with the following medications:  -carbamazepine  -dexamethasone  -medicines for seizures like carbamazepine, phenobarbital, phenytoin  -neomycin  -rifabutin  -rifampin  -St. John's Wort  -warfarin   This list may not describe all possible interactions. Give your health care provider a list of all the medicines, herbs, non-prescription drugs, or dietary supplements you use. Also tell them if you smoke, drink alcohol, or use illegal drugs. Some items may interact with your medicine.  What should I watch for while using this medicine?  This drug may make you feel generally unwell. This is not uncommon, as chemotherapy can affect healthy cells as well as cancer cells. Report any side effects. Continue your course of treatment even though you feel ill unless your doctor tells you to stop.  Men and women should use effective birth control while taking this medicine and for 2 weeks after stopping this medicine. Do not become pregnant while taking this medicine. Women should inform their doctor if they wish to become pregnant or think they might be pregnant. There is a potential for serious side effects to an unborn child. Talk to your health care professional or pharmacist for more information. Do not breast-feed an infant while taking this medicine.  If you are going to have surgery or any other procedures, tell your doctor you are taking this medicine.  What side effects may I notice from receiving this medicine?  Side effects that you should report to your doctor or health care professional as soon as possible:  -allergic reactions like skin rash, itching or hives, swelling of the face, lips, or tongue  -black,   tarry stools  -breathing problems  -chest pain or chest tightness  -dark urine  -dizziness  -fast or irregular heartbeat  -feeling faint or lightheaded  -high fever  -light-colored stools  -nausea, vomiting  -redness, blistering, peeling or loosening of the skin, including inside the mouth  -right upper belly pain  -sores on the hands or feet  -spitting up blood or brown material that looks like coffee grounds  -stomach pain  -yellowing of the eyes or skin   Side effects that usually do not require medical attention (report to your doctor or health care professional if they continue or are bothersome):  -diarrhea  -hair loss  -loss of appetite  -tiredness  -weight loss  This list may not describe all possible side effects. Call your doctor for medical advice about side effects. You may report side effects to FDA at 1-800-FDA-1088.  Where should I keep my medicine?  Keep out of the reach of children.  Store at room temperature between 15 and 30 degrees C (59 and 86 degrees F). Protect from moisture. Throw away any unused medicine after the expiration date.  NOTE: This sheet is a summary. It may not cover all possible information. If you have questions about this medicine, talk to your doctor, pharmacist, or health care provider.  © 2018 Elsevier/Gold Standard (2015-12-14 21:34:08)

## 2017-05-19 ENCOUNTER — Encounter: Payer: Self-pay | Admitting: *Deleted

## 2017-05-19 ENCOUNTER — Other Ambulatory Visit: Payer: Self-pay | Admitting: *Deleted

## 2017-05-19 DIAGNOSIS — C220 Liver cell carcinoma: Secondary | ICD-10-CM
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
## 2017-05-20 ENCOUNTER — Encounter: Payer: Self-pay | Admitting: *Deleted

## 2017-05-20 NOTE — Progress Notes (Signed)
[place] Work  Clinical Social Work had PCS form completed by RN/MD and was submitted via fax to [employer] to request CNA help at the home through [facility]. CSW also mailed list of ALF to significant other. CSW to follow and assist accordingly.   Clinical Social Work interventions:  Resource assistance  Kyle Mejia, [employer], OSW-C Clinical Social Worker [place]  [place] Phone: [phone] Fax: [phone]

## 2017-05-30 ENCOUNTER — Other Ambulatory Visit: Payer: Self-pay | Admitting: Pharmacist

## 2017-05-30 ENCOUNTER — Other Ambulatory Visit: Payer: Self-pay

## 2017-05-30 DIAGNOSIS — C220 Liver cell carcinoma: Secondary | ICD-10-CM

## 2017-05-30 MED ORDER — SORAFENIB TOSYLATE 200 MG PO TABS: ORAL_TABLET | ORAL | 1 refills | Status: DC

## 2017-05-31 ENCOUNTER — Other Ambulatory Visit: Payer: Self-pay | Admitting: Hematology

## 2017-05-31 MED ORDER — FOLIC ACID 1 MG PO TABS: 1.0000 mg | ORAL_TABLET | Freq: Every day | ORAL | 2 refills | Status: DC

## 2017-05-31 MED ORDER — CLONIDINE HCL 0.2 MG PO TABS: 0.2000 mg | ORAL_TABLET | Freq: Two times a day (BID) | ORAL | 11 refills | Status: DC

## 2017-05-31 MED ORDER — LISINOPRIL 10 MG PO TABS: ORAL_TABLET | ORAL | 2 refills | Status: DC

## 2017-05-31 MED FILL — FOLIC ACID 1 MG TABLET: 1 | 30 days supply | Qty: 30 | Fill #0

## 2017-05-31 MED FILL — LISINOPRIL 10 MG TABLET: 10 | 30 days supply | Qty: 30 | Fill #0

## 2017-05-31 MED FILL — cloNIDine HCL 0.2 MG TABS: 0.2 | 30 days supply | Qty: 60 | Fill #0

## 2017-05-31 NOTE — Telephone Encounter (Signed)
Per patient, medications were not sent to [facility] pharmacy.  Clonidine, folic acid, Lisinopril reordered and sent electronically to [facility].

## 2017-06-01 ENCOUNTER — Ambulatory Visit (HOSPITAL_BASED_OUTPATIENT_CLINIC_OR_DEPARTMENT_OTHER): Payer: Medicaid Other | Admitting: Nurse Practitioner

## 2017-06-01 ENCOUNTER — Other Ambulatory Visit (HOSPITAL_BASED_OUTPATIENT_CLINIC_OR_DEPARTMENT_OTHER): Payer: Medicaid Other

## 2017-06-01 VITALS — BP 154/102 | HR 111 | Temp 97.8°F | Resp 18 | Ht 67.0 in | Wt 165.1 lb

## 2017-06-01 DIAGNOSIS — C220 Liver cell carcinoma: Secondary | ICD-10-CM | POA: Diagnosis not present

## 2017-06-01 DIAGNOSIS — B1920 Unspecified viral hepatitis C without hepatic coma: Secondary | ICD-10-CM | POA: Diagnosis not present

## 2017-06-01 LAB — CBC WITH DIFFERENTIAL/PLATELET
BASO%: 0.3 % (ref 0.0–2.0)
Basophils Absolute: 0 10*3/uL (ref 0.0–0.1)
EOS ABS: 0.1 10*3/uL (ref 0.0–0.5)
EOS%: 1.5 % (ref 0.0–7.0)
HEMATOCRIT: 52 % — AB (ref 38.4–49.9)
HEMOGLOBIN: 18.6 g/dL — AB (ref 13.0–17.1)
LYMPH#: 3.5 10*3/uL — AB (ref 0.9–3.3)
LYMPH%: 36.2 % (ref 14.0–49.0)
MCH: 31.4 pg (ref 27.2–33.4)
MCHC: 35.8 g/dL (ref 32.0–36.0)
MCV: 87.7 fL (ref 79.3–98.0)
MONO#: 1 10*3/uL — AB (ref 0.1–0.9)
MONO%: 10.1 % (ref 0.0–14.0)
NEUT%: 51.9 % (ref 39.0–75.0)
NEUTROS ABS: 5 10*3/uL (ref 1.5–6.5)
NRBC: 0 % (ref 0–0)
PLATELETS: 96 10*3/uL — AB (ref 140–400)
RBC: 5.93 10*6/uL — AB (ref 4.20–5.82)
RDW: 13.5 % (ref 11.0–14.6)
WBC: 9.6 10*3/uL (ref 4.0–10.3)

## 2017-06-01 LAB — COMPREHENSIVE METABOLIC PANEL
ALBUMIN: 3.6 g/dL (ref 3.5–5.0)
ALT: 58 U/L — ABNORMAL HIGH (ref 0–55)
ANION GAP: 10 meq/L (ref 3–11)
AST: 46 U/L — ABNORMAL HIGH (ref 5–34)
Alkaline Phosphatase: 184 U/L — ABNORMAL HIGH (ref 40–150)
BILIRUBIN TOTAL: 1.32 mg/dL — AB (ref 0.20–1.20)
BUN: 19.9 mg/dL (ref 7.0–26.0)
CALCIUM: 9.3 mg/dL (ref 8.4–10.4)
CO2: 27 meq/L (ref 22–29)
CREATININE: 0.7 mg/dL (ref 0.7–1.3)
Chloride: 106 mEq/L (ref 98–109)
EGFR: >90 mL/min/{1.73_m2} (ref >90)
Glucose: 86 mg/dl (ref 70–140)
Potassium: 4.3 mEq/L (ref 3.5–5.1)
Sodium: 142 mEq/L (ref 136–145)
TOTAL PROTEIN: 7.9 g/dL (ref 6.4–8.3)
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
## 2017-06-02 ENCOUNTER — Encounter: Payer: Self-pay | Admitting: *Deleted

## 2017-06-02 ENCOUNTER — Telehealth: Payer: Self-pay | Admitting: *Deleted

## 2017-06-02 NOTE — Telephone Encounter (Signed)
Message from [place] with questions about medication. Returned call, clarified dose escalation instructions. Pt will take 2 Nexavar in AM and 2 in PM. Jenny Reichmann reports she was told that he will no longer receive medication from manufacturer since he now has Medicaid. She will call office when he has 1 week supply remaining. Will follow up with oral chemo navigator re: pharmacy recommendation for next cycle.

## 2017-06-02 NOTE — Progress Notes (Signed)
[place] Work  Clinical Social Work was referred by need for re-assessment of psychosocial needs.  Clinical Social Worker contacted caregiver at home to offer support and assess for needs.  Caregiver reports they still have not received packet of ALF listings that [facility] mailed 05/19/17. CSW sent list via email today. Caregiver reports pt often forgets things and has a hard time keeping up with information. She feels he may have misplaced letter. She reports she is working on applying for [employer] and is aware of need for TB test and FL2 to be completed through PCP office in order for pt to be placed in ALF. They plan to visit PCP at the end of the month in order to get these needed items completed. She also reports pt is aware of her health issues and need for him to be in ALF as she cannot provide adequate care long-term.  She reports pt is coping adequately with this information. They hope to visit a few ALFs in the near future. They agree to reach out to [facility] team as needed.    Clinical Social Work interventions:  Production manager education and referral  Loren Racer, LCSW, OSW-C Clinical Social Worker [place]  [place] Phone: [phone] Fax: [phone]

## 2017-06-03 ENCOUNTER — Telehealth: Payer: Self-pay | Admitting: Pharmacist

## 2017-06-03 DIAGNOSIS — C220 Liver cell carcinoma: Secondary | ICD-10-CM

## 2017-06-03 MED ORDER — SORAFENIB TOSYLATE 200 MG PO TABS: ORAL_TABLET | ORAL | 1 refills | Status: DC

## 2017-06-03 NOTE — Telephone Encounter (Signed)
Oral Chemotherapy Pharmacist Encounter  Received notification that patient now has Medicaid prescription coverage and will no longer be receiving manufacturer assistance for his Nexavar prescription.  Nexavar prescription e-scribed to the [facility].  I called Gasper Lloyd to update her on prescription status. She states Mr. Coller is taking his Nexavar at 400mg  (2 x 200mg  tablets) by mouth 2 times daily and has been at this increased dose since 06/02/17. He has missed 0 tablets of his Nexavar since start date 05/04/17. Jenny Reichmann states Mr Buchmann is very fatigued with very little appetite. This is stable over the past few weeks.  We will reach out to the patient in ~1-2 weeks for next Nexavar fill.  Oral Oncology Clinic will continue to follow.  Johny Drilling, PharmD, BCPS, BCOP 06/03/2017  1:12 PM Oral Oncology Clinic [phone]

## 2017-06-08 NOTE — Telephone Encounter (Signed)
Call from pt asking how to get Nexavar refilled. Informed him refill will come from [facility]. He has 36 pills remaining. Pt stated he is taking 2 in the AM and one every PM. Instructed him to take 2 twice daily as prescribed. Pharmacy will contact when ready for pick up. Pt will need reinforcement of dose instructions.

## 2017-06-14 ENCOUNTER — Ambulatory Visit (INDEPENDENT_AMBULATORY_CARE_PROVIDER_SITE_OTHER): Payer: Medicaid Other | Admitting: Family Medicine

## 2017-06-14 ENCOUNTER — Other Ambulatory Visit: Payer: Self-pay | Admitting: Family Medicine

## 2017-06-14 ENCOUNTER — Encounter: Payer: Self-pay | Admitting: Family Medicine

## 2017-06-14 VITALS — BP 135/90 | HR 105 | Temp 98.0°F | Resp 14 | Ht 67.0 in | Wt 162.0 lb

## 2017-06-14 DIAGNOSIS — G4700 Insomnia, unspecified: Secondary | ICD-10-CM

## 2017-06-14 DIAGNOSIS — C220 Liver cell carcinoma: Secondary | ICD-10-CM | POA: Diagnosis not present

## 2017-06-14 DIAGNOSIS — F329 Major depressive disorder, single episode, unspecified: Secondary | ICD-10-CM | POA: Diagnosis not present

## 2017-06-14 DIAGNOSIS — F32A Depression, unspecified: Secondary | ICD-10-CM

## 2017-06-14 DIAGNOSIS — F419 Anxiety disorder, unspecified: Secondary | ICD-10-CM

## 2017-06-14 DIAGNOSIS — R7989 Other specified abnormal findings of blood chemistry: Secondary | ICD-10-CM

## 2017-06-14 DIAGNOSIS — L918 Other hypertrophic disorders of the skin: Secondary | ICD-10-CM | POA: Diagnosis not present

## 2017-06-14 DIAGNOSIS — R946 Abnormal results of thyroid function studies: Secondary | ICD-10-CM

## 2017-06-14 LAB — POCT URINALYSIS DIP (DEVICE)
Glucose, UA: NEGATIVE mg/dL
KETONES UR: NEGATIVE mg/dL
Nitrite: POSITIVE — AB
PH: 5.5 (ref 5.0–8.0)
Protein, ur: 30 mg/dL — AB
Specific Gravity, Urine: >=1.03 (ref 1.005–1.030)
Urobilinogen, UA: 0.2 mg/dL (ref 0.0–1.0)

## 2017-06-14 MED ORDER — LISINOPRIL 10 MG PO TABS: ORAL_TABLET | ORAL | 3 refills | Status: DC

## 2017-06-14 MED ORDER — PAROXETINE HCL 20 MG PO TABS: 20.0000 mg | ORAL_TABLET | Freq: Every day | ORAL | 1 refills | Status: DC

## 2017-06-14 MED ORDER — HYDROXYZINE HCL 50 MG PO TABS: 200.0000 mg | ORAL_TABLET | Freq: Every day | ORAL | 2 refills | Status: DC

## 2017-06-14 MED ORDER — METOPROLOL SUCCINATE ER 25 MG PO TB24: 25.0000 mg | ORAL_TABLET | Freq: Every day | ORAL | 2 refills | Status: DC

## 2017-06-14 MED ORDER — CIPROFLOXACIN HCL 500 MG PO TABS: 500.0000 mg | ORAL_TABLET | Freq: Two times a day (BID) | ORAL | 0 refills | Status: DC
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
## 2017-06-14 NOTE — Patient Instructions (Addendum)
Please review your current medication list for current prescribed meds. I have discontinue Celexa and I am starting you on Paxil 20 mg for depression and anxiety. Return in 6 weeks for follow-up to evaluate effectiveness of treatment.   Skin Tag, Adult A skin tag (acrochordon) is a soft, extra growth of skin. Most skin tags are flesh-colored and rarely bigger than a pencil eraser. They commonly form near areas where there are folds in the skin, such as the armpit or groin. Skin tags are not dangerous, and they do not spread from person to person (are not contagious). You may have one skin tag or several. Skin tags do not require treatment. However, your health care provider may recommend removal of a skin tag if it:  Gets irritated from clothing.  Bleeds.  Is visible and unsightly.  Your health care provider can remove skin tags with a simple surgical procedure or a procedure that involves freezing the skin tag. Follow these instructions at home:  Watch for any changes in your skin tag. A normal skin tag does not require any other special care at home.  Take over-the-counter and prescription medicines only as told by your health care provider.  Keep all follow-up visits as told by your health care provider. This is important. Contact a health care provider if:  You have a skin tag that: ? Becomes painful. ? Changes color. ? Bleeds. ? Swells.  You develop more skin tags. This information is not intended to replace advice given to you by your health care provider. Make sure you discuss any questions you have with your health care provider. Document Released: 11/23/2015 Document Revised: 07/04/2016 Document Reviewed: 11/23/2015 Elsevier Interactive Patient Education  2018 [employer].   Paroxetine tablets What is this medicine? PAROXETINE (pa ROX e teen) is used to treat depression. It may also be used to treat anxiety disorders, obsessive compulsive disorder, panic attacks,  post traumatic stress, and premenstrual dysphoric disorder (PMDD). This medicine may be used for other purposes; ask your health care provider or pharmacist if you have questions. COMMON BRAND NAME(S): Paxil, Pexeva What should I tell my health care provider before I take this medicine? They need to know if you have any of these conditions: -bipolar disorder or a family history of bipolar disorder -bleeding disorders -glaucoma -heart disease -kidney disease -liver disease -low levels of sodium in the blood -seizures -suicidal thoughts, plans, or attempt; a previous suicide attempt by you or a family member -take MAOIs like Carbex, Eldepryl, Marplan, Nardil, and Parnate -take medicines that treat or prevent blood clots -thyroid disease -an unusual or allergic reaction to paroxetine, other medicines, foods, dyes, or preservatives -pregnant or trying to get pregnant -breast-feeding How should I use this medicine? Take this medicine by mouth with a glass of water. Follow the directions on the prescription label. You can take it with or without food. Take your medicine at regular intervals. Do not take your medicine more often than directed. Do not stop taking this medicine suddenly except upon the advice of your doctor. Stopping this medicine too quickly may cause serious side effects or your condition may worsen. A special MedGuide will be given to you by the pharmacist with each prescription and refill. Be sure to read this information carefully each time. Talk to your pediatrician regarding the use of this medicine in children. Special care may be needed. Overdosage: If you think you have taken too much of this medicine contact a poison control center or emergency  room at once. NOTE: This medicine is only for you. Do not share this medicine with others. What if I miss a dose? If you miss a dose, take it as soon as you can. If it is almost time for your next dose, take only that dose. Do not  take double or extra doses. What may interact with this medicine? Do not take this medicine with any of the following medications: -linezolid -MAOIs like Carbex, Eldepryl, Marplan, Nardil, and Parnate -methylene blue (injected into a vein) -pimozide -thioridazine This medicine may also interact with the following medications: -alcohol -amphetamines -aspirin and aspirin-like medicines -atomoxetine -certain medicines for depression, anxiety, or psychotic disturbances -certain medicines for irregular heart beat like propafenone, flecainide, encainide, and quinidine -certain medicines for migraine headache like almotriptan, eletriptan, frovatriptan, naratriptan, rizatriptan, sumatriptan, zolmitriptan -cimetidine -digoxin -diuretics -fentanyl -fosamprenavir -furazolidone -isoniazid -lithium -medicines that treat or prevent blood clots like warfarin, enoxaparin, and dalteparin -medicines for sleep -NSAIDs, medicines for pain and inflammation, like ibuprofen or naproxen -phenobarbital -phenytoin -procarbazine -rasagiline -ritonavir -supplements like St. John's wort, kava kava, valerian -tamoxifen -tramadol -tryptophan This list may not describe all possible interactions. Give your health care provider a list of all the medicines, herbs, non-prescription drugs, or dietary supplements you use. Also tell them if you smoke, drink alcohol, or use illegal drugs. Some items may interact with your medicine. What should I watch for while using this medicine? Tell your doctor if your symptoms do not get better or if they get worse. Visit your doctor or health care professional for regular checks on your progress. Because it may take several weeks to see the full effects of this medicine, it is important to continue your treatment as prescribed by your doctor. Patients and their families should watch out for new or worsening thoughts of suicide or depression. Also watch out for sudden changes in  feelings such as feeling anxious, agitated, panicky, irritable, hostile, aggressive, impulsive, severely restless, overly excited and hyperactive, or not being able to sleep. If this happens, especially at the beginning of treatment or after a change in dose, call your health care professional. Dennis Bast may get drowsy or dizzy. Do not drive, use machinery, or do anything that needs mental alertness until you know how this medicine affects you. Do not stand or sit up quickly, especially if you are an older patient. This reduces the risk of dizzy or fainting spells. Alcohol may interfere with the effect of this medicine. Avoid alcoholic drinks. Your mouth may get dry. Chewing sugarless gum or sucking hard candy, and drinking plenty of water will help. Contact your doctor if the problem does not go away or is severe. What side effects may I notice from receiving this medicine? Side effects that you should report to your doctor or health care professional as soon as possible: -allergic reactions like skin rash, itching or hives, swelling of the face, lips, or tongue -anxious -black, tarry stools -changes in vision -confusion -elevated mood, decreased need for sleep, racing thoughts, impulsive behavior -eye pain -fast, irregular heartbeat -feeling faint or lightheaded, falls -feeling agitated, angry, or irritable -hallucination, loss of contact with reality -loss of balance or coordination -loss of memory -painful or prolonged erections -restlessness, pacing, inability to keep still -seizures -stiff muscles -suicidal thoughts or other mood changes -trouble sleeping -unusual bleeding or bruising -unusually weak or tired -vomiting Side effects that usually do not require medical attention (report to your doctor or health care professional if they continue or are bothersome): -change  in appetite or weight -change in sex drive or performance -diarrhea -dizziness -dry mouth -increased  sweating -indigestion, nausea -tired -tremors This list may not describe all possible side effects. Call your doctor for medical advice about side effects. You may report side effects to FDA at 1-800-FDA-1088. Where should I keep my medicine? Keep out of the reach of children. Store at room temperature between 15 and 30 degrees C (59 and 86 degrees F). Keep container tightly closed. Throw away any unused medicine after the expiration date. NOTE: This sheet is a summary. It may not cover all possible information. If you have questions about this medicine, talk to your doctor, pharmacist, or health care provider.  2018 Elsevier/Gold Standard (2016-04-10 15:50:32)

## 2017-06-15 LAB — THYROID PANEL WITH TSH
Free Thyroxine Index: 3.7 (ref 1.4–3.8)
T3 UPTAKE: 22 % (ref 22–35)
T4 TOTAL: 16.9 ug/dL — AB (ref 4.5–12.0)
TSH: 5.67 m[IU]/L — AB (ref 0.40–4.50)

## 2017-06-20 ENCOUNTER — Telehealth: Payer: Self-pay | Admitting: Oncology

## 2017-06-20 MED FILL — NexAVAR 200 MG TABS: 200 | 30 days supply | Qty: 120 | Fill #0

## 2017-06-20 NOTE — Telephone Encounter (Signed)
Spoke with caretaker Jenny Reichmann confirming 71/10 lab/fu and 8/1 appointment with infectious disease clinic.

## 2017-06-22 ENCOUNTER — Ambulatory Visit (INDEPENDENT_AMBULATORY_CARE_PROVIDER_SITE_OTHER): Payer: Medicaid Other | Admitting: Internal Medicine

## 2017-06-22 ENCOUNTER — Encounter: Payer: Self-pay | Admitting: Internal Medicine

## 2017-06-22 VITALS — BP 118/77 | HR 93 | Temp 98.2°F | Ht 67.0 in | Wt 161.0 lb

## 2017-06-22 DIAGNOSIS — C220 Liver cell carcinoma: Secondary | ICD-10-CM

## 2017-06-22 DIAGNOSIS — B182 Chronic viral hepatitis C: Secondary | ICD-10-CM | POA: Diagnosis not present

## 2017-06-22 DIAGNOSIS — F1011 Alcohol abuse, in remission: Secondary | ICD-10-CM

## 2017-06-22 DIAGNOSIS — Z87898 Personal history of other specified conditions: Secondary | ICD-10-CM

## 2017-06-22 LAB — COMPLETE METABOLIC PANEL WITH GFR
ALT: 44 U/L (ref 9–46)
AST: 31 U/L (ref 10–35)
Albumin: 3.5 g/dL — ABNORMAL LOW (ref 3.6–5.1)
Alkaline Phosphatase: 124 U/L — ABNORMAL HIGH (ref 40–115)
BILIRUBIN TOTAL: 1.5 mg/dL — AB (ref 0.2–1.2)
BUN: 30 mg/dL — AB (ref 7–25)
CHLORIDE: 104 mmol/L (ref 98–110)
CO2: 26 mmol/L (ref 20–31)
CREATININE: 0.8 mg/dL (ref 0.70–1.25)
Calcium: 8.5 mg/dL — ABNORMAL LOW (ref 8.6–10.3)
GFR, Est Non African American: >89 mL/min (ref >=60)
Glucose, Bld: 129 mg/dL — ABNORMAL HIGH (ref 65–99)
Potassium: 4.2 mmol/L (ref 3.5–5.3)
Sodium: 139 mmol/L (ref 135–146)
TOTAL PROTEIN: 6.8 g/dL (ref 6.1–8.1)

## 2017-06-22 LAB — CBC WITH DIFFERENTIAL/PLATELET
BASOS ABS: 0 {cells}/uL (ref 0–200)
Basophils Relative: 0 %
EOS ABS: 138 {cells}/uL (ref 15–500)
Eosinophils Relative: 1 %
HEMATOCRIT: 48.1 % (ref 38.5–50.0)
Hemoglobin: 16.9 g/dL (ref 13.2–17.1)
LYMPHS PCT: 17 %
Lymphs Abs: 2346 cells/uL (ref 850–3900)
MCH: 31.4 pg (ref 27.0–33.0)
MCHC: 35.1 g/dL (ref 32.0–36.0)
MCV: 89.2 fL (ref 80.0–100.0)
MONO ABS: 1380 {cells}/uL — AB (ref 200–950)
MONOS PCT: 10 %
MPV: 11.2 fL (ref 7.5–12.5)
NEUTROS PCT: 72 %
Neutro Abs: 9936 cells/uL — ABNORMAL HIGH (ref 1500–7800)
PLATELETS: 107 10*3/uL — AB (ref 140–400)
RBC: 5.39 MIL/uL (ref 4.20–5.80)
RDW: 13.3 % (ref 11.0–15.0)
WBC: 13.8 10*3/uL — ABNORMAL HIGH (ref 3.8–10.8)

## 2017-06-22 LAB — PROTIME-INR
INR: 1.1
Prothrombin Time: 11.9 s — ABNORMAL HIGH (ref 9.0–11.5)

## 2017-06-22 LAB — HIV ANTIBODY (ROUTINE TESTING W REFLEX): HIV: NONREACTIVE

## 2017-06-22 LAB — HEPATITIS A ANTIBODY, TOTAL: Hep A Total Ab: NONREACTIVE

## 2017-06-22 LAB — HEPATITIS B CORE ANTIBODY, TOTAL: Hep B Core Total Ab: REACTIVE — AB

## 2017-06-22 LAB — HEPATITIS B SURFACE ANTIBODY,QUALITATIVE: HEP B S AB: REACTIVE — AB

## 2017-06-22 LAB — HEPATITIS B SURFACE ANTIGEN: HEP B S AG: NONREACTIVE

## 2017-06-22 NOTE — Assessment & Plan Note (Signed)
I discussed with him and his wife treatment options and goals of treatment.  Typically, cure of hepatitis C is to prevent cirrhosis and development of liver cancer, which he now has both.  I discussed there may be some benefit in his case with uncurable HCC to limit further damage to his liver in the setting of [place] so I think it is reasonable to consider treatment.  He will need a non-PI based treatment due to advanced liver disease.  Will likely need to use ribavirin. I will check his genotype today and consider treatment options

## 2017-06-22 NOTE — Assessment & Plan Note (Addendum)
Uncurable per oncology.  On chronic sorafenib Active HCC makes HCV cure lower with treatment.

## 2017-06-22 NOTE — Patient Instructions (Signed)
Date 06/22/17  Dear Mr. Burgo, As discussed in the [facility], your hepatitis C therapy will include highly effective medication(s) for treatment and will vary based on the type of hepatitis C and insurance approval.  Potential medications include:          Harvoni (sofosbuvir 90mg /ledipasvir 400mg ) tablet oral daily          OR     Epclusa (sofosbuvir 400mg /velpatasvir 100mg ) tablet oral daily          OR      Mavyret (glecaprevir 100 mg/pibrentasvir 40 mg): Take 3 tablets oral daily          OR     Zepatier (elbasvir 50 mg/grazoprevir 100 mg) oral daily, +/- ribavirin              Medications are typically for 12 or 24 weeks total ---------------------------------------------------------------- Your HCV Treatment Start Date: You will be notified by our office once the medication is approved and where you can pick it up (or if mailed)   ---------------------------------------------------------------- [place]:   [facility] [place],  [address] Phone: [phone] Hours: Monday to Friday 7:30 am to 6:00 pm   Please always contact your pharmacy at least 3-4 business days before you run out of medications to ensure your next month's medication is ready or 1 week prior to running out if you receive it by mail.  Remember, each prescription is for 28 days. ---------------------------------------------------------------- GENERAL NOTES REGARDING YOUR HEPATITIS C MEDICATION:  Some medications have the following interactions:  - Acid reducing agents such as H2 blockers (ie. Pepcid (famotidine), Zantac (ranitidine), Tagamet (cimetidine), Axid (nizatidine) and proton pump inhibitors (ie. Prilosec (omeprazole), Protonix (pantoprazole), Nexium (esomeprazole), or Aciphex (rabeprazole)). Do not take until you have discussed with a health care provider.    -Antacids that contain magnesium and/or aluminum hydroxide (ie. Milk of Magensia,  Rolaids, Gaviscon, Maalox, Mylanta, an dArthritis Pain Formula).  -Calcium carbonate (calcium supplements or antacids such as Tums, Caltrate, Os-Cal).  -St. John's wort or any products that contain St. John's wort like some herbal supplements  Please inform the office prior to starting any of these medications.  - The common side effects associated with Harvoni include:      1. Fatigue      2. Headache      3. Nausea      4. Diarrhea      5. Insomnia  Please note that this only lists the most common side effects and is NOT a comprehensive list of the potential side effects of these medications. For more information, please review the drug information sheets that come with your medication package from the pharmacy.  ---------------------------------------------------------------- GENERAL HELPFUL HINTS ON HCV THERAPY: 1. Stay well-hydrated. 2. Notify the [facility] of any changes in your other over-the-counter/herbal or prescription medications. 3. If you miss a dose of your medication, take the missed dose as soon as you remember. Return to your regular time/dose schedule the next day.  4.  Do not stop taking your medications without first talking with your healthcare provider. 5.  You may take Tylenol (acetaminophen), as long as the dose is less than 2000 mg (OR no more than 4 tablets of the Tylenol Extra Strengths 500mg  tablet) in 24 hours. 6.  You will see our pharmacist-specialist within the first 2 weeks of starting your medication to monitor for any possible side effects. 7.  You will have labs once during treatment, soon  after treatment completion and one final lab 6 months after treatment completion to verify the virus is out of your system.  Scharlene Gloss, [place] for [facility] [place], [place]  [id number] [phone]

## 2017-06-22 NOTE — Progress Notes (Signed)
   Subjective:    Patient ID: Kyle Mejia, male    DOB: 1956-06-04, 61 y.o.   MRN: [id number]  HPI He is here for follow up of HCV.   I saw him in September 2017 as a new patient and started the work up for hepatitis C.  He had an elevated AFP but never got an ultrasound or came back for treatment consideration.  He comes in now with known, uncurable [place].  He is on sorafenib which he is to take continuously as long as he can tolerate it.  Otherwise, no plans per Dr. Benay Spice of surgery, ablation, emboliazation or other treatment.  He has a history of alcohol abuse.     Review of Systems  Constitutional: Positive for fatigue.  Gastrointestinal: Negative for diarrhea and nausea.       Objective:   Physical Exam  Constitutional:  Chronically ill-appearing  Eyes: No scleral icterus.  Cardiovascular: Normal rate, regular rhythm and normal heart sounds.   No murmur heard. Pulmonary/Chest: Effort normal and breath sounds normal. No respiratory distress.  Lymphadenopathy:    He has no cervical adenopathy.  Skin: No rash noted.   SH: no alcohol now       Assessment & Plan:

## 2017-06-22 NOTE — Assessment & Plan Note (Signed)
Currently not drinking 

## 2017-06-26 LAB — HCV RNA, QN PCR RFLX GENO, LIPA
HCV RNA, PCR, QN: 175000 [IU]/mL — AB
HCV RNA, PCR, QN: 5.24 {Log_IU}/mL — AB

## 2017-06-26 LAB — HEPATITIS C GENOTYPE

## 2017-06-27 ENCOUNTER — Ambulatory Visit: Payer: Self-pay | Admitting: Oncology

## 2017-06-27 ENCOUNTER — Other Ambulatory Visit: Payer: Self-pay

## 2017-06-27 ENCOUNTER — Other Ambulatory Visit: Payer: Self-pay | Admitting: Pharmacist Clinician (PhC)/ Clinical Pharmacy Specialist

## 2017-06-27 MED ORDER — SOFOSBUVIR-VELPATASVIR 400-100 MG PO TABS: 1.0000 | ORAL_TABLET | Freq: Every day | ORAL | 2 refills | Status: DC

## 2017-06-27 MED ORDER — RIBAVIRIN 200 MG PO TABS: ORAL_TABLET | ORAL | 2 refills | Status: DC

## 2017-06-27 NOTE — Progress Notes (Signed)
He has medicaid so has to use [place] and riba for decompensated.

## 2017-06-29 ENCOUNTER — Telehealth: Payer: Self-pay | Admitting: Pharmacy Technician

## 2017-06-29 NOTE — Telephone Encounter (Signed)
Kyle Mejia was denied Epclusa to treat hepatitis C because he has abused alcohol and is not in treatment (his admission on the readiness to treat form required by [facility]). I gave his caretaker Kyle Mejia the information to the [facility] [phone].  She said she will talk with him about it.  If he attends the program, I will reapply with Medicaid for approval.

## 2017-07-01 ENCOUNTER — Ambulatory Visit (HOSPITAL_BASED_OUTPATIENT_CLINIC_OR_DEPARTMENT_OTHER): Payer: Medicaid Other | Admitting: Oncology

## 2017-07-01 ENCOUNTER — Other Ambulatory Visit (HOSPITAL_BASED_OUTPATIENT_CLINIC_OR_DEPARTMENT_OTHER): Payer: Medicaid Other

## 2017-07-01 VITALS — BP 140/87 | HR 81 | Temp 99.2°F | Resp 18 | Ht 67.0 in | Wt 156.6 lb

## 2017-07-01 DIAGNOSIS — C220 Liver cell carcinoma: Secondary | ICD-10-CM

## 2017-07-01 LAB — CBC WITH DIFFERENTIAL/PLATELET
BASO%: 0.7 % (ref 0.0–2.0)
BASOS ABS: 0.1 10*3/uL (ref 0.0–0.1)
EOS%: 3.2 % (ref 0.0–7.0)
Eosinophils Absolute: 0.3 10*3/uL (ref 0.0–0.5)
HCT: 47.9 % (ref 38.4–49.9)
HGB: 16.8 g/dL (ref 13.0–17.1)
LYMPH#: 3.5 10*3/uL — AB (ref 0.9–3.3)
LYMPH%: 38.7 % (ref 14.0–49.0)
MCH: 31.1 pg (ref 27.2–33.4)
MCHC: 35.1 g/dL (ref 32.0–36.0)
MCV: 88.7 fL (ref 79.3–98.0)
MONO#: 0.7 10*3/uL (ref 0.1–0.9)
MONO%: 8.1 % (ref 0.0–14.0)
NEUT#: 4.4 10*3/uL (ref 1.5–6.5)
NEUT%: 49.3 % (ref 39.0–75.0)
PLATELETS: 110 10*3/uL — AB (ref 140–400)
RBC: 5.4 10*6/uL (ref 4.20–5.82)
RDW: 13.5 % (ref 11.0–14.6)
WBC: 9 10*3/uL (ref 4.0–10.3)

## 2017-07-01 LAB — COMPREHENSIVE METABOLIC PANEL
ALBUMIN: 3.4 g/dL — AB (ref 3.5–5.0)
ALK PHOS: 126 U/L (ref 40–150)
ALT: 43 U/L (ref 0–55)
AST: 36 U/L — ABNORMAL HIGH (ref 5–34)
Anion Gap: 8 mEq/L (ref 3–11)
BILIRUBIN TOTAL: 1.56 mg/dL — AB (ref 0.20–1.20)
BUN: 22 mg/dL (ref 7.0–26.0)
CO2: 29 mEq/L (ref 22–29)
Calcium: 9 mg/dL (ref 8.4–10.4)
Chloride: 105 mEq/L (ref 98–109)
Creatinine: 0.9 mg/dL (ref 0.7–1.3)
EGFR: 87 mL/min/{1.73_m2} — AB (ref >90)
GLUCOSE: 87 mg/dL (ref 70–140)
Potassium: 4.6 mEq/L (ref 3.5–5.1)
SODIUM: 141 meq/L (ref 136–145)
TOTAL PROTEIN: 7.4 g/dL (ref 6.4–8.3)

## 2017-07-01 MED ORDER — MEGESTROL ACETATE 400 MG/10ML PO SUSP: 200.0000 mg | Freq: Two times a day (BID) | ORAL | 1 refills | Status: DC
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
## 2017-07-02 LAB — AFP TUMOR MARKER

## 2017-07-05 MED FILL — LISINOPRIL 10 MG TABLET: 10 | 30 days supply | Qty: 30 | Fill #1

## 2017-07-05 MED FILL — FOLIC ACID 1 MG TABLET: 1 | 30 days supply | Qty: 30 | Fill #1

## 2017-07-05 MED FILL — cloNIDine HCL 0.2 MG TABS: 0.2 | 30 days supply | Qty: 60 | Fill #1

## 2017-07-12 MED FILL — NexAVAR 200 MG TABS: 200 | 30 days supply | Qty: 120 | Fill #1

## 2017-07-14 ENCOUNTER — Encounter: Payer: Self-pay | Admitting: Neurology

## 2017-07-14 ENCOUNTER — Encounter: Payer: Self-pay | Admitting: Psychology

## 2017-07-14 ENCOUNTER — Ambulatory Visit (INDEPENDENT_AMBULATORY_CARE_PROVIDER_SITE_OTHER): Payer: Medicaid Other | Admitting: Neurology

## 2017-07-14 VITALS — BP 128/88 | HR 81 | Ht 68.0 in | Wt 159.0 lb

## 2017-07-14 DIAGNOSIS — S065X9A Traumatic subdural hemorrhage with loss of consciousness of unspecified duration, initial encounter: Secondary | ICD-10-CM

## 2017-07-14 DIAGNOSIS — I609 Nontraumatic subarachnoid hemorrhage, unspecified: Secondary | ICD-10-CM | POA: Diagnosis not present

## 2017-07-14 DIAGNOSIS — R419 Unspecified symptoms and signs involving cognitive functions and awareness: Secondary | ICD-10-CM | POA: Diagnosis not present

## 2017-07-14 DIAGNOSIS — I6200 Nontraumatic subdural hemorrhage, unspecified: Secondary | ICD-10-CM | POA: Diagnosis not present

## 2017-07-14 DIAGNOSIS — C220 Liver cell carcinoma: Secondary | ICD-10-CM

## 2017-07-14 DIAGNOSIS — S065XAA Traumatic subdural hemorrhage with loss of consciousness status unknown, initial encounter: Secondary | ICD-10-CM

## 2017-07-14 DIAGNOSIS — Z87820 Personal history of traumatic brain injury: Secondary | ICD-10-CM | POA: Diagnosis not present

## 2017-07-14 NOTE — Patient Instructions (Addendum)
Your memory score is fairly good today.   Please make sure you follow up with your neurosurgeon.   For your memory complaints, we will request a formal neuropsychological test (aka cognitive testing) for your memory complaints. This requires a referral to a trained and licensed neuropsychologist and will be a separate appointment at a different clinic.  We will call you with the results and take it from there.   You have complaints of memory loss: memory loss or changes in cognitive function can have many reasons and does not always mean you have dementia. Conditions that can contribute to subjective or objective memory loss include: depression, stress, poor sleep from insomnia or sleep apnea, dehydration, fluctuation in blood sugar values, thyroid or electrolyte dysfunction and certain vitamin deficiencies. Dementia can be caused by stroke, brain atherosclerosis or brain vascular disease due to vascular risk factors (smoking, high blood pressure, high cholesterol, obesity and uncontrolled diabetes), certain degenerative brain disorders (including Parkinson's disease and Multiple sclerosis) and by Alzheimer's disease or other, more rare and sometimes hereditary causes. Your memory loss is rather mild at this point, which, of course is reassuring.
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
## 2017-07-20 ENCOUNTER — Other Ambulatory Visit (HOSPITAL_BASED_OUTPATIENT_CLINIC_OR_DEPARTMENT_OTHER): Payer: Medicaid Other

## 2017-07-20 ENCOUNTER — Telehealth: Payer: Self-pay

## 2017-07-20 ENCOUNTER — Ambulatory Visit (HOSPITAL_BASED_OUTPATIENT_CLINIC_OR_DEPARTMENT_OTHER): Payer: Medicaid Other | Admitting: Nurse Practitioner

## 2017-07-20 VITALS — BP 101/62 | HR 68 | Temp 98.7°F | Resp 18 | Ht 68.0 in | Wt 160.1 lb

## 2017-07-20 DIAGNOSIS — C220 Liver cell carcinoma: Secondary | ICD-10-CM

## 2017-07-20 LAB — COMPREHENSIVE METABOLIC PANEL
ALBUMIN: 3 g/dL — AB (ref 3.5–5.0)
ALK PHOS: 86 U/L (ref 40–150)
ALT: 37 U/L (ref 0–55)
ANION GAP: 5 meq/L (ref 3–11)
AST: 27 U/L (ref 5–34)
BUN: 29.3 mg/dL — AB (ref 7.0–26.0)
CALCIUM: 9.1 mg/dL (ref 8.4–10.4)
CHLORIDE: 109 meq/L (ref 98–109)
CO2: 25 mEq/L (ref 22–29)
CREATININE: 0.8 mg/dL (ref 0.7–1.3)
EGFR: >90 mL/min/{1.73_m2} (ref >90)
Glucose: 106 mg/dl (ref 70–140)
Potassium: 4.1 mEq/L (ref 3.5–5.1)
Sodium: 140 mEq/L (ref 136–145)
Total Bilirubin: 0.72 mg/dL (ref 0.20–1.20)
Total Protein: 6.7 g/dL (ref 6.4–8.3)

## 2017-07-20 LAB — CBC WITH DIFFERENTIAL/PLATELET
BASO%: 0.3 % (ref 0.0–2.0)
BASOS ABS: 0 10*3/uL (ref 0.0–0.1)
EOS ABS: 0.2 10*3/uL (ref 0.0–0.5)
EOS%: 2.4 % (ref 0.0–7.0)
HCT: 39.3 % (ref 38.4–49.9)
HEMOGLOBIN: 13.4 g/dL (ref 13.0–17.1)
LYMPH%: 38.7 % (ref 14.0–49.0)
MCH: 30.5 pg (ref 27.2–33.4)
MCHC: 34.1 g/dL (ref 32.0–36.0)
MCV: 89.5 fL (ref 79.3–98.0)
MONO#: 0.9 10*3/uL (ref 0.1–0.9)
MONO%: 9.6 % (ref 0.0–14.0)
NEUT%: 49 % (ref 39.0–75.0)
NEUTROS ABS: 4.4 10*3/uL (ref 1.5–6.5)
PLATELETS: 114 10*3/uL — AB (ref 140–400)
RBC: 4.39 10*6/uL (ref 4.20–5.82)
RDW: 14.2 % (ref 11.0–14.6)
WBC: 8.9 10*3/uL (ref 4.0–10.3)
lymph#: 3.5 10*3/uL — ABNORMAL HIGH (ref 0.9–3.3)

## 2017-07-20 NOTE — Telephone Encounter (Signed)
Spoke with wife concerning upcoming appointment for 9/18. Per los
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
## 2017-07-21 LAB — AFP TUMOR MARKER: AFP, SERUM, TUMOR MARKER: 817.6 ng/mL — AB (ref 0.0–8.3)

## 2017-07-22 ENCOUNTER — Telehealth: Payer: Self-pay

## 2017-07-22 NOTE — Telephone Encounter (Signed)
Nutrition Follow-up:  Messaged received from NP, Lattie Haw to follow-up with Kyle Mejia regarding nutrition related questions.    Patient receiving sorafenib for hepatocellular cancer. Followed by Dr. Achille Rich this am and reports patient is not eating well despite being on megace.  Drinks ensure/boost plus shakes or store brand equivalent, usually drinks 2 per day. Kyle Mejia is having a difficult time caring for him as she has stage IV COPD and not well herself.  Reports she is seeking assisted living for patient.  Has questions about foods that patient can easily prepare or things she can have on hand that patient can eat easily.    Kyle Mejia did not have much time to talk as having to go to MD appointment for herself.  Medications: megace, folic acid  Labs: reviewed  Anthropometrics:   Weight decreased to 160 lb 1. 6 oz from 170 lb 6.4 oz in May 2018   NUTRITION DIAGNOSIS: Inadequate oral intake continues   INTERVENTION:   Discussed with wife strategies to increase calories and protein as volume of meals if very low.  Also discussed foods hign in protein Will mail fact sheets  Encouraged oral nutrition supplements with at least 350 calories or more. Discussed ways wife can increase calories and protein of supplements.   Will mail coupons for supplements and contact information    MONITORING, EVALUATION, GOAL: patient will consume adequate calories and protein to prevent weight loss   NEXT VISIT: as need, wife to contact  Kyle Mejia, [place], [place] Registered Dietitian [phone] (pager)

## 2017-07-26 ENCOUNTER — Ambulatory Visit (INDEPENDENT_AMBULATORY_CARE_PROVIDER_SITE_OTHER): Payer: Medicaid Other | Admitting: Family Medicine

## 2017-07-26 ENCOUNTER — Telehealth: Payer: Self-pay | Admitting: Family Medicine

## 2017-07-26 VITALS — BP 129/76 | HR 68 | Temp 98.2°F | Resp 14 | Ht 68.0 in | Wt 157.6 lb

## 2017-07-26 DIAGNOSIS — C220 Liver cell carcinoma: Secondary | ICD-10-CM | POA: Diagnosis not present

## 2017-07-26 DIAGNOSIS — M25562 Pain in left knee: Secondary | ICD-10-CM | POA: Diagnosis not present

## 2017-07-26 DIAGNOSIS — G4700 Insomnia, unspecified: Secondary | ICD-10-CM

## 2017-07-26 DIAGNOSIS — G8929 Other chronic pain: Secondary | ICD-10-CM

## 2017-07-26 MED ORDER — PREDNISONE 20 MG PO TABS: ORAL_TABLET | ORAL | 0 refills | Status: DC

## 2017-07-26 MED ORDER — HYDROXYZINE HCL 50 MG PO TABS: 200.0000 mg | ORAL_TABLET | Freq: Every day | ORAL | 2 refills | Status: DC

## 2017-07-26 MED ORDER — PAROXETINE HCL 20 MG PO TABS: 20.0000 mg | ORAL_TABLET | Freq: Every day | ORAL | 1 refills | Status: AC

## 2017-07-26 MED ORDER — LISINOPRIL 10 MG PO TABS: ORAL_TABLET | ORAL | 3 refills | Status: AC

## 2017-07-26 MED ORDER — METOPROLOL SUCCINATE ER 25 MG PO TB24: 25.0000 mg | ORAL_TABLET | Freq: Every day | ORAL | 2 refills | Status: AC

## 2017-07-26 MED ORDER — FOLIC ACID 1 MG PO TABS: 1.0000 mg | ORAL_TABLET | Freq: Every day | ORAL | 2 refills | Status: AC

## 2017-07-26 MED ORDER — CLONAZEPAM 0.5 MG PO TABS: 0.5000 mg | ORAL_TABLET | Freq: Every day | ORAL | 0 refills | Status: DC
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
## 2017-07-26 NOTE — Telephone Encounter (Signed)
Refill sent into [facility]. Thanks!

## 2017-07-26 NOTE — Patient Instructions (Addendum)
Take Prednisone 20 mg,  in mornings with breakfast as follows:  Take 3 pills for 3 days, Take 2 pills for 3 days, and Take 1 pill for 3 days.  Complete all medication.    Continue hydroxyzine and adding Clonopin for insomnia. Follow-up in 4 weeks for evaluate.  Go downstairs to radiology to obtain x-ray of left knee.   Knee Pain, Adult Many things can cause knee pain. The pain often goes away on its own with time and rest. If the pain does not go away, tests may be done to find out what is causing the pain. Follow these instructions at home: Activity  Rest your knee.  Do not do things that cause pain.  Avoid activities where both feet leave the ground at the same time (high-impact activities). Examples are running, jumping rope, and doing jumping jacks. General instructions  Take medicines only as told by your doctor.  Raise (elevate) your knee when you are resting. Make sure your knee is higher than your heart.  Sleep with a pillow under your knee.  If told, put ice on the knee: ? Put ice in a plastic bag. ? Place a towel between your skin and the bag. ? Leave the ice on for 20 minutes, 2-3 times a day.  Ask your doctor if you should wear an elastic knee support.  Lose weight if you are overweight. Being overweight can make your knee hurt more.  Do not use any tobacco products. These include cigarettes, chewing tobacco, or electronic cigarettes. If you need help quitting, ask your doctor. Smoking may slow down healing. Contact a doctor if:  The pain does not stop.  The pain changes or gets worse.  You have a fever along with knee pain.  Your knee gives out or locks up.  Your knee swells, and becomes worse. Get help right away if:  Your knee feels warm.  You cannot move your knee.  You have very bad knee pain.  You have chest pain.  You have trouble breathing. Summary  Many things can cause knee pain. The pain often goes away on its own with time and  rest.  Avoid activities that put stress on your knee. These include running and jumping rope.  Get help right away if you cannot move your knee, or if your knee feels warm, or if you have trouble breathing. This information is not intended to replace advice given to you by your health care provider. Make sure you discuss any questions you have with your health care provider. Document Released: 02/04/2009 Document Revised: 11/02/2016 Document Reviewed: 11/02/2016 Elsevier Interactive Patient Education  2017 [employer].

## 2017-07-26 NOTE — Telephone Encounter (Signed)
Please refill all prescription except anti-neoplastic medications. Send to [facility].

## 2017-07-28 ENCOUNTER — Telehealth: Payer: Self-pay | Admitting: Family Medicine

## 2017-07-28 NOTE — Telephone Encounter (Signed)
Jenny Reichmann called because she feels that the patient needs to be in an assisted living facility and would like for an FL2 to be completed on him. Patient states that she needs to talk to [name] about him because he is refusing to take his medicine, is forgetful and she feels that he is a danger to himself, and she is no longer able to provide care to him, since she has stage 4 COPD. Please advise.

## 2017-07-29 NOTE — Telephone Encounter (Signed)
Left a vm for patient to callback 

## 2017-08-01 ENCOUNTER — Other Ambulatory Visit: Payer: Self-pay | Admitting: Oncology

## 2017-08-01 DIAGNOSIS — C220 Liver cell carcinoma: Secondary | ICD-10-CM

## 2017-08-02 ENCOUNTER — Ambulatory Visit (HOSPITAL_COMMUNITY)
Admission: RE | Admit: 2017-08-02 | Discharge: 2017-08-02 | Disposition: A | Discharge status: Home / Self Care | Payer: Medicaid Other | Source: Ambulatory Visit | Attending: Family Medicine | Admitting: Family Medicine

## 2017-08-02 DIAGNOSIS — M25562 Pain in left knee: Secondary | ICD-10-CM | POA: Insufficient documentation

## 2017-08-02 DIAGNOSIS — G8929 Other chronic pain: Secondary | ICD-10-CM | POA: Diagnosis not present

## 2017-08-02 NOTE — Telephone Encounter (Signed)
Spoke with patient and let him know that they need to bring form by for Maudie Mercury to fill out and she will get them connect with [facility].

## 2017-08-02 NOTE — Telephone Encounter (Signed)
Please contact [facility] regarding patient needing assistance with obtaining placement in assisted living facility. (this is a referral and may require prior office notes).  Contact patient/family to advise I will complete the FL2 form as I have seen him recently and he will likely require a TB screening before being accepted in a facility. However [facility] can assist family with finding an eligible facility for placement.  Carroll Sage. Kenton Kingfisher, MSN, FNP-C [facility]  [address] [place], [place] [address] [phone]

## 2017-08-09 ENCOUNTER — Other Ambulatory Visit (HOSPITAL_BASED_OUTPATIENT_CLINIC_OR_DEPARTMENT_OTHER): Payer: Medicaid Other

## 2017-08-09 ENCOUNTER — Encounter: Payer: Self-pay | Admitting: *Deleted

## 2017-08-09 ENCOUNTER — Ambulatory Visit (HOSPITAL_BASED_OUTPATIENT_CLINIC_OR_DEPARTMENT_OTHER): Payer: Medicaid Other | Admitting: Oncology

## 2017-08-09 DIAGNOSIS — C220 Liver cell carcinoma: Secondary | ICD-10-CM

## 2017-08-09 LAB — CBC WITH DIFFERENTIAL/PLATELET
BASO%: 0.4 % (ref 0.0–2.0)
Basophils Absolute: 0 10*3/uL (ref 0.0–0.1)
EOS ABS: 0.3 10*3/uL (ref 0.0–0.5)
EOS%: 2.7 % (ref 0.0–7.0)
HCT: 42.8 % (ref 38.4–49.9)
HEMOGLOBIN: 14.7 g/dL (ref 13.0–17.1)
LYMPH%: 34.1 % (ref 14.0–49.0)
MCH: 30.6 pg (ref 27.2–33.4)
MCHC: 34.3 g/dL (ref 32.0–36.0)
MCV: 89.2 fL (ref 79.3–98.0)
MONO#: 0.8 10*3/uL (ref 0.1–0.9)
MONO%: 8.3 % (ref 0.0–14.0)
NEUT%: 54.5 % (ref 39.0–75.0)
NEUTROS ABS: 5 10*3/uL (ref 1.5–6.5)
PLATELETS: 100 10*3/uL — AB (ref 140–400)
RBC: 4.8 10*6/uL (ref 4.20–5.82)
RDW: 14.3 % (ref 11.0–14.6)
WBC: 9.2 10*3/uL (ref 4.0–10.3)
lymph#: 3.1 10*3/uL (ref 0.9–3.3)

## 2017-08-09 LAB — COMPREHENSIVE METABOLIC PANEL
ALBUMIN: 3.4 g/dL — AB (ref 3.5–5.0)
ALK PHOS: 87 U/L (ref 40–150)
ALT: 42 U/L (ref 0–55)
ANION GAP: 8 meq/L (ref 3–11)
AST: 30 U/L (ref 5–34)
BILIRUBIN TOTAL: 1.29 mg/dL — AB (ref 0.20–1.20)
BUN: 20.1 mg/dL (ref 7.0–26.0)
CO2: 23 meq/L (ref 22–29)
CREATININE: 0.9 mg/dL (ref 0.7–1.3)
Calcium: 8.7 mg/dL (ref 8.4–10.4)
Chloride: 110 mEq/L — ABNORMAL HIGH (ref 98–109)
EGFR: >90 mL/min/{1.73_m2} (ref >90)
GLUCOSE: 88 mg/dL (ref 70–140)
Potassium: 3.9 mEq/L (ref 3.5–5.1)
SODIUM: 141 meq/L (ref 136–145)
TOTAL PROTEIN: 7 g/dL (ref 6.4–8.3)
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
## 2017-08-09 NOTE — Progress Notes (Signed)
[facility] [employer] representative received phone call from patients caregiver requesting information/assistance with SNF/ALF placement.  CSW and caregiver discussed SNF vs ALF.  Patient and caregiver have also requested assistance from [facility].  CSW spoke with PCP office and confirmed they have a blank FL2 form, and will contact patient and caregiver once it is complete.  CSw met with patient and caregiver in [facility] exam room to share information and also provided additional blank FL2 form.  CSw provided contact information to PCP office, patient and caregiver.  CSW available to assist as needed.         Johnnye Lana, MSW, LCSW, OSW-C Clinical Social Worker [facility] [phone]

## 2017-08-10 ENCOUNTER — Telehealth: Payer: Self-pay | Admitting: Oncology

## 2017-08-10 LAB — AFP TUMOR MARKER: AFP, Serum, Tumor Marker: 941.1 ng/mL — ABNORMAL HIGH (ref 0.0–8.3)

## 2017-08-10 NOTE — Telephone Encounter (Signed)
Scheduled appt per 9/18 los - patient is aware wants reminder letter sent in the mail - reminder letter sent.

## 2017-08-15 ENCOUNTER — Telehealth: Payer: Self-pay | Admitting: Family Medicine

## 2017-08-15 NOTE — Telephone Encounter (Signed)
Advise girlfriend that patient is scheduled for a follow-up on 08/23/2017, however can reschedule to come in on Friday. The symptoms that she is describing are not new and have been documented in prior encounters. He is in the process of assisted living placement.

## 2017-08-15 NOTE — Telephone Encounter (Signed)
Please contact patient caregiver to inquire about what type of symptoms he is having. Also, please let her know I will complete the FL2 within the next few days as I am just returning to the office.

## 2017-08-15 NOTE — Telephone Encounter (Signed)
Spoke with cindy patient girlfriend and she states that she will keep appointment schedule on 08/23/2017
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
## 2017-08-16 ENCOUNTER — Encounter: Payer: Self-pay | Admitting: Family Medicine

## 2017-08-16 DIAGNOSIS — G4700 Insomnia, unspecified: Secondary | ICD-10-CM | POA: Insufficient documentation

## 2017-08-16 MED FILL — cloNIDine HCL 0.2 MG TABS: 0.2 | 30 days supply | Qty: 60 | Fill #2

## 2017-08-16 MED FILL — LISINOPRIL 10 MG TABS: 10 | 30 days supply | Qty: 30 | Fill #2

## 2017-08-23 ENCOUNTER — Telehealth: Payer: Self-pay | Admitting: Family Medicine

## 2017-08-23 ENCOUNTER — Encounter: Payer: Self-pay | Admitting: Family Medicine

## 2017-08-23 ENCOUNTER — Ambulatory Visit (INDEPENDENT_AMBULATORY_CARE_PROVIDER_SITE_OTHER): Payer: Medicaid Other | Admitting: Family Medicine

## 2017-08-23 VITALS — BP 100/64 | HR 72 | Temp 98.9°F | Resp 12 | Ht 68.0 in | Wt 149.0 lb

## 2017-08-23 DIAGNOSIS — R4182 Altered mental status, unspecified: Secondary | ICD-10-CM | POA: Diagnosis not present

## 2017-08-23 DIAGNOSIS — R440 Auditory hallucinations: Secondary | ICD-10-CM | POA: Diagnosis not present

## 2017-08-23 DIAGNOSIS — R443 Hallucinations, unspecified: Secondary | ICD-10-CM | POA: Diagnosis not present

## 2017-08-23 DIAGNOSIS — Z23 Encounter for immunization: Secondary | ICD-10-CM | POA: Diagnosis not present

## 2017-08-23 DIAGNOSIS — Z111 Encounter for screening for respiratory tuberculosis: Secondary | ICD-10-CM | POA: Diagnosis not present

## 2017-08-23 LAB — POCT URINALYSIS DIP (DEVICE)
Bilirubin Urine: NEGATIVE
Glucose, UA: NEGATIVE mg/dL
Hgb urine dipstick: NEGATIVE
Ketones, ur: NEGATIVE mg/dL
Leukocytes, UA: NEGATIVE
Nitrite: NEGATIVE
Protein, ur: 100 mg/dL — AB
Specific Gravity, Urine: 1.025 (ref 1.005–1.030)
Urobilinogen, UA: 0.2 mg/dL (ref 0.0–1.0)
pH: 6 (ref 5.0–8.0)
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
## 2017-08-23 NOTE — Telephone Encounter (Signed)
Please contact [facility] to add on TSH panel

## 2017-08-24 ENCOUNTER — Other Ambulatory Visit: Payer: Self-pay | Admitting: Family Medicine

## 2017-08-24 LAB — AMMONIA

## 2017-08-24 NOTE — Telephone Encounter (Signed)
[facility] is unable to add lab on as they don't have a tiger top.

## 2017-08-26 LAB — QUANTIFERON TB GOLD ASSAY (BLOOD)
QUANTIFERON NIL VALUE: 0.02 [IU]/mL
QUANTIFERON(R)-TB GOLD: NEGATIVE
Quantiferon Tb Ag Minus Nil Value: 0.08 IU/mL

## 2017-08-28 ENCOUNTER — Telehealth: Payer: Self-pay | Admitting: Family Medicine

## 2017-08-28 NOTE — Telephone Encounter (Signed)
Please fax letter confirming that patient had a negative TB screen. Fax the letter to [employer] [facility] and contact patient's caretaker Jenny Reichmann to advise of negative TB screen and information has been faxed to the facility.  Carroll Sage. Kenton Kingfisher, MSN, FNP-C [facility]  [address] [place], [place] [address] [phone]

## 2017-08-29 ENCOUNTER — Other Ambulatory Visit: Payer: Self-pay | Admitting: Family Medicine

## 2017-08-29 NOTE — Progress Notes (Unsigned)
See telephone note for instructions.

## 2017-08-29 NOTE — Progress Notes (Signed)
Letter was faxed to [facility] per [facility] patient care giver

## 2017-08-31 MED FILL — NexAVAR 200 MG TABS: 200 | 30 days supply | Qty: 120 | Fill #0

## 2017-09-02 ENCOUNTER — Emergency Department (HOSPITAL_COMMUNITY)
Admission: EM | Admit: 2017-09-02 | Discharge: 2017-09-02 | Disposition: A | Discharge status: Home / Self Care | Payer: Medicaid Other | Attending: Emergency Medicine | Admitting: Emergency Medicine

## 2017-09-02 ENCOUNTER — Encounter (HOSPITAL_COMMUNITY): Payer: Self-pay | Admitting: *Deleted

## 2017-09-02 ENCOUNTER — Emergency Department (HOSPITAL_COMMUNITY): Payer: Medicaid Other

## 2017-09-02 DIAGNOSIS — Z79899 Other long term (current) drug therapy: Secondary | ICD-10-CM | POA: Insufficient documentation

## 2017-09-02 DIAGNOSIS — Z711 Person with feared health complaint in whom no diagnosis is made: Secondary | ICD-10-CM | POA: Diagnosis not present

## 2017-09-02 DIAGNOSIS — W19XXXA Unspecified fall, initial encounter: Secondary | ICD-10-CM

## 2017-09-02 DIAGNOSIS — Y929 Unspecified place or not applicable: Secondary | ICD-10-CM | POA: Insufficient documentation

## 2017-09-02 DIAGNOSIS — Y939 Activity, unspecified: Secondary | ICD-10-CM | POA: Diagnosis not present

## 2017-09-02 DIAGNOSIS — Z8505 Personal history of malignant neoplasm of liver: Secondary | ICD-10-CM | POA: Insufficient documentation

## 2017-09-02 DIAGNOSIS — I10 Essential (primary) hypertension: Secondary | ICD-10-CM | POA: Diagnosis not present

## 2017-09-02 DIAGNOSIS — Y999 Unspecified external cause status: Secondary | ICD-10-CM | POA: Diagnosis not present

## 2017-09-02 DIAGNOSIS — R42 Dizziness and giddiness: Secondary | ICD-10-CM | POA: Insufficient documentation

## 2017-09-02 DIAGNOSIS — W010XXA Fall on same level from slipping, tripping and stumbling without subsequent striking against object, initial encounter: Secondary | ICD-10-CM | POA: Insufficient documentation

## 2017-09-02 DIAGNOSIS — F17210 Nicotine dependence, cigarettes, uncomplicated: Secondary | ICD-10-CM | POA: Insufficient documentation

## 2017-09-02 HISTORY — DX: Malignant (primary) neoplasm, unspecified: C80.1

## 2017-09-02 LAB — I-STAT CHEM 8, ED
BUN: 18 mg/dL (ref 6–20)
CHLORIDE: 110 mmol/L (ref 101–111)
CREATININE: 0.8 mg/dL (ref 0.61–1.24)
Calcium, Ion: 1.02 mmol/L — ABNORMAL LOW (ref 1.15–1.40)
GLUCOSE: 75 mg/dL (ref 65–99)
HCT: 33 % — ABNORMAL LOW (ref 39.0–52.0)
Hemoglobin: 11.2 g/dL — ABNORMAL LOW (ref 13.0–17.0)
POTASSIUM: 3.6 mmol/L (ref 3.5–5.1)
Sodium: 144 mmol/L (ref 135–145)
TCO2: 22 mmol/L (ref 22–32)

## 2017-09-02 LAB — I-STAT CG4 LACTIC ACID, ED: Lactic Acid, Venous: 0.94 mmol/L (ref 0.5–1.9)

## 2017-09-02 MED ORDER — SODIUM CHLORIDE 0.9 % IV BOLUS (SEPSIS)
2000.0000 mL | Freq: Once | INTRAVENOUS | Status: AC
  Administered 2017-09-02: 2000 mL via INTRAVENOUS
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
## 2017-09-02 NOTE — Discharge Instructions (Signed)
Follow up with your md next week for recheck.  Drink plenty of fluids °
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
## 2017-09-05 ENCOUNTER — Other Ambulatory Visit: Payer: Self-pay

## 2017-09-05 ENCOUNTER — Ambulatory Visit (HOSPITAL_COMMUNITY): Payer: Medicaid Other

## 2017-09-06 ENCOUNTER — Other Ambulatory Visit (HOSPITAL_BASED_OUTPATIENT_CLINIC_OR_DEPARTMENT_OTHER): Payer: Medicaid Other

## 2017-09-06 ENCOUNTER — Telehealth: Payer: Self-pay

## 2017-09-06 DIAGNOSIS — C220 Liver cell carcinoma: Secondary | ICD-10-CM | POA: Diagnosis not present

## 2017-09-06 LAB — CBC WITH DIFFERENTIAL/PLATELET
BASO%: 0.7 % (ref 0.0–2.0)
BASOS ABS: 0 10*3/uL (ref 0.0–0.1)
EOS ABS: 0.1 10*3/uL (ref 0.0–0.5)
EOS%: 1.9 % (ref 0.0–7.0)
HCT: 37.6 % — ABNORMAL LOW (ref 38.4–49.9)
HGB: 12.3 g/dL — ABNORMAL LOW (ref 13.0–17.1)
LYMPH%: 42.5 % (ref 14.0–49.0)
MCH: 29.6 pg (ref 27.2–33.4)
MCHC: 32.8 g/dL (ref 32.0–36.0)
MCV: 90.4 fL (ref 79.3–98.0)
MONO#: 0.4 10*3/uL (ref 0.1–0.9)
MONO%: 6.3 % (ref 0.0–14.0)
NEUT#: 3.1 10*3/uL (ref 1.5–6.5)
NEUT%: 48.6 % (ref 39.0–75.0)
Platelets: 105 10*3/uL — ABNORMAL LOW (ref 140–400)
RBC: 4.16 10*6/uL — AB (ref 4.20–5.82)
RDW: 15 % — ABNORMAL HIGH (ref 11.0–14.6)
WBC: 6.3 10*3/uL (ref 4.0–10.3)
lymph#: 2.7 10*3/uL (ref 0.9–3.3)

## 2017-09-06 LAB — COMPREHENSIVE METABOLIC PANEL
ALK PHOS: 85 U/L (ref 40–150)
ALT: 25 U/L (ref 0–55)
AST: 20 U/L (ref 5–34)
Albumin: 3 g/dL — ABNORMAL LOW (ref 3.5–5.0)
Anion Gap: 7 mEq/L (ref 3–11)
BUN: 16 mg/dL (ref 7.0–26.0)
CHLORIDE: 112 meq/L — AB (ref 98–109)
CO2: 25 meq/L (ref 22–29)
Calcium: 8.5 mg/dL (ref 8.4–10.4)
Creatinine: 0.9 mg/dL (ref 0.7–1.3)
GLUCOSE: 91 mg/dL (ref 70–140)
POTASSIUM: 4.2 meq/L (ref 3.5–5.1)
SODIUM: 145 meq/L (ref 136–145)
Total Bilirubin: 0.99 mg/dL (ref 0.20–1.20)
Total Protein: 6.2 g/dL — ABNORMAL LOW (ref 6.4–8.3)

## 2017-09-06 NOTE — Telephone Encounter (Signed)
Spoke with Jenny Reichmann and let her know message per Maudie Mercury. Jenny Reichmann states that his sister is taking over his care and that she will have patient fill out paperwork

## 2017-09-06 NOTE — Telephone Encounter (Signed)
Kyle Mejia states that the [facility]  Form need to be checked for skilled nursing facility instead of assisted living. Patient needs care around the clock. Also that he need assisted with taking his medications.

## 2017-09-06 NOTE — Telephone Encounter (Signed)
Behavioral [facility] called and stated that they called to schedule patient and patient declined services. Provider is aware.

## 2017-09-06 NOTE — Telephone Encounter (Signed)
I indicated assisted living as the patient is not immobile and do not require total care which are indications for skilled nursing. He needs more observation for safety, assistance with medications, and prevention of injury from falls. Unfortunately his health is not at the state to warrant skilled nursing facility. I can include medication management on the form as that this definitely needed.   Carroll Sage. Kenton Kingfisher, MSN, FNP-C [facility]  [address] [place] [place], [place] [address] [phone]

## 2017-09-07 ENCOUNTER — Ambulatory Visit (HOSPITAL_COMMUNITY)
Admission: RE | Admit: 2017-09-07 | Discharge: 2017-09-07 | Disposition: A | Discharge status: Home / Self Care | Payer: Medicaid Other | Source: Ambulatory Visit | Attending: Oncology | Admitting: Oncology

## 2017-09-07 DIAGNOSIS — I700 Atherosclerosis of aorta: Secondary | ICD-10-CM | POA: Insufficient documentation

## 2017-09-07 DIAGNOSIS — C220 Liver cell carcinoma: Secondary | ICD-10-CM | POA: Diagnosis not present

## 2017-09-07 MED ORDER — IOPAMIDOL (ISOVUE-300) INJECTION 61%
INTRAVENOUS | Status: AC
  Administered 2017-09-07: 100 mL
  Filled 2017-09-07: qty 100

## 2017-09-08 ENCOUNTER — Ambulatory Visit (HOSPITAL_BASED_OUTPATIENT_CLINIC_OR_DEPARTMENT_OTHER): Payer: Medicaid Other | Admitting: Nurse Practitioner

## 2017-09-08 ENCOUNTER — Encounter: Payer: Self-pay | Admitting: *Deleted

## 2017-09-08 VITALS — BP 148/102 | HR 109 | Temp 98.4°F | Resp 17 | Ht 67.0 in | Wt 145.3 lb

## 2017-09-08 DIAGNOSIS — C220 Liver cell carcinoma: Secondary | ICD-10-CM | POA: Diagnosis not present

## 2017-09-08 DIAGNOSIS — R4182 Altered mental status, unspecified: Secondary | ICD-10-CM | POA: Diagnosis not present
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
## 2017-09-09 NOTE — Progress Notes (Signed)
[place] Work  Clinical Social Work was requested to meet with patient's sisters today after medical oncology appointment.  Patient's sisters shared they are in town to assist patient in living placement.  They plan to meet with DSS tomorrow regarding ALF placement.  CSW provided patient's family with questions to ask DSS, possible facility placement suggestions, general information regarding Medicaid options.  Patient's family was very appreciative of today's visit and plans to follow up with CSW team next week.   Maryjean Morn, MSW, LCSW, OSW-C Clinical Social Worker [facility] [phone]

## 2017-09-22 MED FILL — NexAVAR 200 MG TABS: 200 | 30 days supply | Qty: 120 | Fill #1

## 2017-09-23 ENCOUNTER — Ambulatory Visit: Payer: Self-pay | Admitting: Family Medicine

## 2017-09-27 ENCOUNTER — Other Ambulatory Visit: Payer: Self-pay | Admitting: Family Medicine

## 2017-09-27 ENCOUNTER — Telehealth: Payer: Self-pay

## 2017-09-27 MED ORDER — CLONAZEPAM 0.5 MG PO TABS: ORAL_TABLET | ORAL | 0 refills | Status: DC

## 2017-09-27 NOTE — Telephone Encounter (Signed)
Morey Hummingbird could you contact Mr. Treto sister to advise I agreed to refill Klonopin one time only since he is planning to establish with another provider. Could ask which pharmacy medication should be faxed to.  Carroll Sage. Kenton Kingfisher, MSN, FNP-C [facility]  [address] [place], [place] [address] [phone]

## 2017-09-27 NOTE — Telephone Encounter (Signed)
Script faxed to [facility] per patient sister Jenny Reichmann

## 2017-09-27 NOTE — Progress Notes (Signed)
Refilled Klonopin once only as patient is reestablishing with another provider out of town.

## 2017-10-10 ENCOUNTER — Other Ambulatory Visit (HOSPITAL_BASED_OUTPATIENT_CLINIC_OR_DEPARTMENT_OTHER): Payer: Medicaid Other

## 2017-10-10 ENCOUNTER — Ambulatory Visit (HOSPITAL_BASED_OUTPATIENT_CLINIC_OR_DEPARTMENT_OTHER): Payer: Medicaid Other | Admitting: Oncology

## 2017-10-10 VITALS — BP 132/73 | HR 72 | Temp 98.3°F | Resp 18 | Ht 67.0 in | Wt 144.6 lb

## 2017-10-10 DIAGNOSIS — C220 Liver cell carcinoma: Secondary | ICD-10-CM

## 2017-10-10 DIAGNOSIS — F1099 Alcohol use, unspecified with unspecified alcohol-induced disorder: Secondary | ICD-10-CM | POA: Diagnosis not present

## 2017-10-10 DIAGNOSIS — R197 Diarrhea, unspecified: Secondary | ICD-10-CM

## 2017-10-10 LAB — CBC WITH DIFFERENTIAL/PLATELET
BASO%: 1 % (ref 0.0–2.0)
BASOS ABS: 0.1 10*3/uL (ref 0.0–0.1)
EOS ABS: 0.2 10*3/uL (ref 0.0–0.5)
EOS%: 1.9 % (ref 0.0–7.0)
HEMATOCRIT: 44.3 % (ref 38.4–49.9)
HEMOGLOBIN: 14.4 g/dL (ref 13.0–17.1)
LYMPH#: 4.1 10*3/uL — AB (ref 0.9–3.3)
LYMPH%: 40.4 % (ref 14.0–49.0)
MCH: 29.4 pg (ref 27.2–33.4)
MCHC: 32.5 g/dL (ref 32.0–36.0)
MCV: 90.7 fL (ref 79.3–98.0)
MONO#: 0.6 10*3/uL (ref 0.1–0.9)
MONO%: 5.8 % (ref 0.0–14.0)
NEUT#: 5.1 10*3/uL (ref 1.5–6.5)
NEUT%: 50.9 % (ref 39.0–75.0)
PLATELETS: 169 10*3/uL (ref 140–400)
RBC: 4.89 10*6/uL (ref 4.20–5.82)
RDW: 15.7 % — AB (ref 11.0–14.6)
WBC: 10 10*3/uL (ref 4.0–10.3)

## 2017-10-10 LAB — COMPREHENSIVE METABOLIC PANEL
ALT: 47 U/L (ref 0–55)
ANION GAP: 8 meq/L (ref 3–11)
AST: 31 U/L (ref 5–34)
Albumin: 3.4 g/dL — ABNORMAL LOW (ref 3.5–5.0)
Alkaline Phosphatase: 68 U/L (ref 40–150)
BUN: 13.1 mg/dL (ref 7.0–26.0)
CHLORIDE: 112 meq/L — AB (ref 98–109)
CO2: 20 meq/L — AB (ref 22–29)
CREATININE: 0.8 mg/dL (ref 0.7–1.3)
Calcium: 9.2 mg/dL (ref 8.4–10.4)
EGFR: >60 mL/min/{1.73_m2} (ref >60)
Glucose: 85 mg/dl (ref 70–140)
Potassium: 4.6 mEq/L (ref 3.5–5.1)
Sodium: 139 mEq/L (ref 136–145)
Total Bilirubin: 0.72 mg/dL (ref 0.20–1.20)
Total Protein: 7.1 g/dL (ref 6.4–8.3)

## 2017-10-10 MED ORDER — LOPERAMIDE HCL 2 MG PO CAPS: 2.0000 mg | ORAL_CAPSULE | ORAL | 0 refills | Status: DC | PRN
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
## 2017-10-11 LAB — AMMONIA: AMMONIA, PLASMA: 51 ug/dL (ref 27–102)

## 2017-10-11 LAB — AFP TUMOR MARKER: AFP, Serum, Tumor Marker: 1334 ng/mL — ABNORMAL HIGH (ref 0.0–8.3)

## 2017-10-14 ENCOUNTER — Other Ambulatory Visit: Payer: Self-pay | Admitting: Oncology

## 2017-10-14 DIAGNOSIS — C220 Liver cell carcinoma: Secondary | ICD-10-CM

## 2017-10-14 MED FILL — NexAVAR 200 MG TABS: 200 | 30 days supply | Qty: 120 | Fill #0

## 2017-10-17 ENCOUNTER — Telehealth: Payer: Self-pay | Admitting: Oncology

## 2017-10-17 NOTE — Telephone Encounter (Signed)
Left message re 12/47 f/u. Schedule mailed.

## 2017-11-07 ENCOUNTER — Ambulatory Visit (HOSPITAL_BASED_OUTPATIENT_CLINIC_OR_DEPARTMENT_OTHER): Payer: Medicaid Other | Admitting: Nurse Practitioner

## 2017-11-07 ENCOUNTER — Encounter: Payer: Self-pay | Admitting: Nurse Practitioner

## 2017-11-07 ENCOUNTER — Telehealth: Payer: Self-pay | Admitting: Nurse Practitioner

## 2017-11-07 ENCOUNTER — Ambulatory Visit: Payer: Medicaid Other

## 2017-11-07 VITALS — BP 131/74 | HR 71 | Temp 98.3°F | Resp 18 | Ht 67.0 in | Wt 149.8 lb

## 2017-11-07 DIAGNOSIS — C220 Liver cell carcinoma: Secondary | ICD-10-CM

## 2017-11-07 NOTE — Telephone Encounter (Signed)
Scheduled appt per 12/17 los - Gave patient AVS and calender per los,.
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
## 2017-11-08 MED FILL — NexAVAR 200 MG TABS: 200 | 30 days supply | Qty: 120 | Fill #1

## 2017-11-11 ENCOUNTER — Ambulatory Visit (INDEPENDENT_AMBULATORY_CARE_PROVIDER_SITE_OTHER): Payer: Medicaid Other | Admitting: Family Medicine

## 2017-11-11 ENCOUNTER — Encounter: Payer: Self-pay | Admitting: Family Medicine

## 2017-11-11 VITALS — BP 100/63 | HR 74 | Temp 98.7°F | Resp 16 | Ht 67.0 in | Wt 146.0 lb

## 2017-11-11 DIAGNOSIS — M25562 Pain in left knee: Secondary | ICD-10-CM | POA: Diagnosis not present

## 2017-11-11 DIAGNOSIS — R197 Diarrhea, unspecified: Secondary | ICD-10-CM

## 2017-11-11 DIAGNOSIS — F411 Generalized anxiety disorder: Secondary | ICD-10-CM | POA: Diagnosis not present

## 2017-11-11 DIAGNOSIS — Z131 Encounter for screening for diabetes mellitus: Secondary | ICD-10-CM

## 2017-11-11 DIAGNOSIS — C220 Liver cell carcinoma: Secondary | ICD-10-CM

## 2017-11-11 DIAGNOSIS — G8929 Other chronic pain: Secondary | ICD-10-CM

## 2017-11-11 LAB — POCT URINALYSIS DIP (DEVICE)
BILIRUBIN URINE: NEGATIVE
Glucose, UA: NEGATIVE mg/dL
Ketones, ur: NEGATIVE mg/dL
NITRITE: NEGATIVE
PH: 5.5 (ref 5.0–8.0)
PROTEIN: 30 mg/dL — AB
Specific Gravity, Urine: 1.025 (ref 1.005–1.030)
Urobilinogen, UA: 0.2 mg/dL (ref 0.0–1.0)

## 2017-11-11 LAB — POCT GLYCOSYLATED HEMOGLOBIN (HGB A1C): HEMOGLOBIN A1C: 5

## 2017-11-11 MED ORDER — CLONAZEPAM 0.5 MG PO TABS: ORAL_TABLET | ORAL | 0 refills | Status: DC

## 2017-11-11 MED ORDER — LOPERAMIDE HCL 2 MG PO CAPS: 4.0000 mg | ORAL_CAPSULE | Freq: Three times a day (TID) | ORAL | 1 refills | Status: DC | PRN

## 2017-11-11 MED ORDER — ESCITALOPRAM OXALATE 10 MG PO TABS: 10.0000 mg | ORAL_TABLET | Freq: Every day | ORAL | 0 refills | Status: DC

## 2017-11-11 MED ORDER — LOPERAMIDE HCL 2 MG PO CAPS: 4.0000 mg | ORAL_CAPSULE | Freq: Three times a day (TID) | ORAL | 0 refills | Status: DC | PRN
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
## 2017-11-12 LAB — SPECIMEN STATUS REPORT

## 2017-11-14 LAB — GI PROFILE, STOOL, PCR

## 2017-11-14 LAB — COMPREHENSIVE METABOLIC PANEL
A/G RATIO: 1.5 (ref 1.2–2.2)
ALK PHOS: 73 IU/L (ref 39–117)
ALT: 36 IU/L (ref 0–44)
AST: 22 IU/L (ref 0–40)
Albumin: 3.8 g/dL (ref 3.6–4.8)
BILIRUBIN TOTAL: 0.6 mg/dL (ref 0.0–1.2)
BUN/Creatinine Ratio: 16 (ref 10–24)
BUN: 10 mg/dL (ref 8–27)
CHLORIDE: 114 mmol/L — AB (ref 96–106)
CO2: 17 mmol/L — ABNORMAL LOW (ref 20–29)
Calcium: 8.2 mg/dL — ABNORMAL LOW (ref 8.6–10.2)
Creatinine, Ser: 0.62 mg/dL — ABNORMAL LOW (ref 0.76–1.27)
GFR calc Af Amer: 122 mL/min/{1.73_m2} (ref >59)
GFR calc non Af Amer: 106 mL/min/{1.73_m2} (ref >59)
Globulin, Total: 2.5 g/dL (ref 1.5–4.5)
Glucose: 88 mg/dL (ref 65–99)
POTASSIUM: 3.3 mmol/L — AB (ref 3.5–5.2)
Sodium: 146 mmol/L — ABNORMAL HIGH (ref 134–144)
Total Protein: 6.3 g/dL (ref 6.0–8.5)

## 2017-11-14 LAB — CBC WITH DIFFERENTIAL/PLATELET
Basophils Absolute: 0 10*3/uL (ref 0.0–0.2)
Basos: 0 %
EOS (ABSOLUTE): 0.3 10*3/uL (ref 0.0–0.4)
Eos: 3 %
HEMATOCRIT: 38.3 % (ref 37.5–51.0)
HEMOGLOBIN: 12.9 g/dL — AB (ref 13.0–17.7)
Immature Grans (Abs): 0 10*3/uL (ref 0.0–0.1)
Immature Granulocytes: 0 %
LYMPHS ABS: 2.6 10*3/uL (ref 0.7–3.1)
LYMPHS: 29 %
MCH: 30.6 pg (ref 26.6–33.0)
MCHC: 33.7 g/dL (ref 31.5–35.7)
MCV: 91 fL (ref 79–97)
MONOS ABS: 0.8 10*3/uL (ref 0.1–0.9)
Monocytes: 8 %
NEUTROS ABS: 5.3 10*3/uL (ref 1.4–7.0)
NEUTROS PCT: 60 %
Platelets: 131 10*3/uL — ABNORMAL LOW (ref 150–379)
RBC: 4.21 x10E6/uL (ref 4.14–5.80)
RDW: 16.6 % — ABNORMAL HIGH (ref 12.3–15.4)
WBC: 9 10*3/uL (ref 3.4–10.8)

## 2017-11-14 LAB — MAGNESIUM: Magnesium: 1.2 mg/dL — ABNORMAL LOW (ref 1.6–2.3)

## 2017-11-16 ENCOUNTER — Other Ambulatory Visit: Payer: Self-pay | Admitting: Family Medicine

## 2017-11-16 ENCOUNTER — Telehealth: Payer: Self-pay | Admitting: Family Medicine

## 2017-11-16 ENCOUNTER — Telehealth: Payer: Self-pay

## 2017-11-16 MED ORDER — MAGNESIUM 400 MG PO TABS: 400.0000 mg | ORAL_TABLET | Freq: Two times a day (BID) | ORAL | 1 refills | Status: AC

## 2017-11-16 MED ORDER — POTASSIUM CHLORIDE CRYS ER 20 MEQ PO TBCR: 20.0000 meq | EXTENDED_RELEASE_TABLET | Freq: Every day | ORAL | 0 refills | Status: DC

## 2017-11-16 NOTE — Telephone Encounter (Signed)
Please contact family to determine the name and contact information for the facility Mr. Luffman is at. Contact the facility and request to speak with DON, RN or LPN on staff and notify them that patient had low magnesim and low potassium at last visit. Obtain a fax number to fax over results and printed prescriptions.   Thanks,  Carroll Sage. Kenton Kingfisher, MSN, FNP-C [facility]  [address] [place], [place] [address] [phone]

## 2017-11-16 NOTE — Telephone Encounter (Signed)
Spoke with Santiago Glad at [facility] and she states that the patient has being having diarrhea and refuses to go to ED. Also patient needs his Klonopin refill as well.   ph:[phone] [phone]

## 2017-11-16 NOTE — Progress Notes (Signed)
Fax forms and medication list to SNF.  Thanks,  Carroll Sage. Kenton Kingfisher, MSN, FNP-C [facility]  [address] [place], [place] [address] [phone]

## 2017-11-16 NOTE — Telephone Encounter (Signed)
Script was refilled on 12/21 and patient should have script with him

## 2017-11-16 NOTE — Telephone Encounter (Signed)
Klonopin was refilled on 11/11/2017 and prescription was sent with all other documentation back to the facility.  We are waiting for stool sample to sent to the lab to evaluate the cause of diarrhea. Patient was given a stool sample kit. If condition is worsening, he definitely needs to be evaluated in the ED.  Carroll Sage. Kenton Kingfisher, MSN, FNP-C [facility]  [address] [place], [place] [address] [phone]

## 2017-11-18 NOTE — Telephone Encounter (Signed)
Santiago Glad at [facility] Notified

## 2017-11-18 NOTE — Telephone Encounter (Signed)
Santiago Glad notified at [facility]

## 2017-11-24 ENCOUNTER — Emergency Department (HOSPITAL_COMMUNITY): Payer: Medicaid Other

## 2017-11-24 ENCOUNTER — Emergency Department (HOSPITAL_COMMUNITY)
Admission: EM | Admit: 2017-11-24 | Discharge: 2017-11-24 | Disposition: A | Discharge status: Home / Self Care | Payer: Medicaid Other | Attending: Emergency Medicine | Admitting: Emergency Medicine

## 2017-11-24 ENCOUNTER — Other Ambulatory Visit: Payer: Self-pay

## 2017-11-24 DIAGNOSIS — R197 Diarrhea, unspecified: Secondary | ICD-10-CM | POA: Diagnosis present

## 2017-11-24 DIAGNOSIS — I10 Essential (primary) hypertension: Secondary | ICD-10-CM | POA: Diagnosis not present

## 2017-11-24 DIAGNOSIS — N3000 Acute cystitis without hematuria: Secondary | ICD-10-CM

## 2017-11-24 DIAGNOSIS — F17210 Nicotine dependence, cigarettes, uncomplicated: Secondary | ICD-10-CM | POA: Diagnosis not present

## 2017-11-24 DIAGNOSIS — Z79899 Other long term (current) drug therapy: Secondary | ICD-10-CM | POA: Insufficient documentation

## 2017-11-24 LAB — COMPREHENSIVE METABOLIC PANEL
ALBUMIN: 3 g/dL — AB (ref 3.5–5.0)
ALT: 38 U/L (ref 17–63)
AST: 31 U/L (ref 15–41)
Alkaline Phosphatase: 62 U/L (ref 38–126)
Anion gap: 5 (ref 5–15)
BUN: 12 mg/dL (ref 6–20)
CO2: 21 mmol/L — AB (ref 22–32)
CREATININE: 0.74 mg/dL (ref 0.61–1.24)
Calcium: 8.2 mg/dL — ABNORMAL LOW (ref 8.9–10.3)
Chloride: 113 mmol/L — ABNORMAL HIGH (ref 101–111)
GFR calc Af Amer: >60 mL/min (ref >60)
GFR calc non Af Amer: >60 mL/min (ref >60)
GLUCOSE: 78 mg/dL (ref 65–99)
Potassium: 4 mmol/L (ref 3.5–5.1)
SODIUM: 139 mmol/L (ref 135–145)
Total Bilirubin: 0.9 mg/dL (ref 0.3–1.2)
Total Protein: 6.2 g/dL — ABNORMAL LOW (ref 6.5–8.1)

## 2017-11-24 LAB — URINALYSIS, ROUTINE W REFLEX MICROSCOPIC
Bilirubin Urine: NEGATIVE
Glucose, UA: NEGATIVE mg/dL
HGB URINE DIPSTICK: NEGATIVE
KETONES UR: NEGATIVE mg/dL
Nitrite: NEGATIVE
PROTEIN: NEGATIVE mg/dL
Specific Gravity, Urine: 1.016 (ref 1.005–1.030)
pH: 5 (ref 5.0–8.0)

## 2017-11-24 LAB — CBC
HCT: 38.2 % — ABNORMAL LOW (ref 39.0–52.0)
Hemoglobin: 12.6 g/dL — ABNORMAL LOW (ref 13.0–17.0)
MCH: 30.3 pg (ref 26.0–34.0)
MCHC: 33 g/dL (ref 30.0–36.0)
MCV: 91.8 fL (ref 78.0–100.0)
PLATELETS: 138 10*3/uL — AB (ref 150–400)
RBC: 4.16 MIL/uL — ABNORMAL LOW (ref 4.22–5.81)
RDW: 17 % — AB (ref 11.5–15.5)
WBC: 7.8 10*3/uL (ref 4.0–10.5)

## 2017-11-24 LAB — LIPASE, BLOOD: LIPASE: 34 U/L (ref 11–51)

## 2017-11-24 MED ORDER — IOPAMIDOL (ISOVUE-300) INJECTION 61%
100.0000 mL | Freq: Once | INTRAVENOUS | Status: AC | PRN
  Administered 2017-11-24: 100 mL via INTRAVENOUS

## 2017-11-24 MED ORDER — IOPAMIDOL (ISOVUE-300) INJECTION 61%
INTRAVENOUS | Status: AC
  Filled 2017-11-24: qty 100

## 2017-11-24 MED ORDER — DICYCLOMINE HCL 20 MG PO TABS: 20.0000 mg | ORAL_TABLET | Freq: Three times a day (TID) | ORAL | 0 refills | Status: DC

## 2017-11-24 MED ORDER — SODIUM CHLORIDE 0.9 % IV BOLUS (SEPSIS)
1000.0000 mL | Freq: Once | INTRAVENOUS | Status: AC
  Administered 2017-11-24: 1000 mL via INTRAVENOUS

## 2017-11-24 MED ORDER — METRONIDAZOLE 500 MG PO TABS: 500.0000 mg | ORAL_TABLET | Freq: Three times a day (TID) | ORAL | 0 refills | Status: AC

## 2017-11-24 MED ORDER — CIPROFLOXACIN HCL 500 MG PO TABS: 500.0000 mg | ORAL_TABLET | Freq: Two times a day (BID) | ORAL | 0 refills | Status: AC

## 2017-11-24 MED ORDER — DEXTROSE 5 % IV SOLN
2.0000 g | Freq: Once | INTRAVENOUS | Status: AC
  Administered 2017-11-24: 2 g via INTRAVENOUS
  Filled 2017-11-24: qty 2
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
## 2017-11-24 NOTE — ED Triage Notes (Signed)
Per EMS: Pt is coming from [facility] (SNF). Pt has been having diarrhea for the last 2 weeks. Pt denies abdominal pain but has nausea right before an episode of diarrhea. Pt has a hx of cancer and reports a yellowish color to diarrhea.   Last vitals 122/80 HR 64 18 RR CBG 97

## 2017-11-24 NOTE — ED Notes (Signed)
Bed: WA22 Expected date:  Expected time:  Means of arrival:  Comments: ems 

## 2017-11-24 NOTE — ED Notes (Signed)
Patient transported to CT 

## 2017-11-24 NOTE — ED Notes (Signed)
Camillia Herter, one of the caregivers at [facility], called by RN to give update on patients care.

## 2017-11-24 NOTE — ED Notes (Signed)
Pt is aware a stool sample needed, but is unable to provide one at this time.

## 2017-11-24 NOTE — ED Notes (Signed)
RN attempted to do blood draw while obtaining IV access. IV attempt successful, but unable to get blood at site. Will have to butterfly to obtain blood work.

## 2017-11-25 LAB — GASTROINTESTINAL PANEL BY PCR, STOOL (REPLACES STOOL CULTURE)

## 2017-11-26 LAB — URINE CULTURE

## 2017-11-27 ENCOUNTER — Telehealth: Payer: Self-pay

## 2017-11-27 NOTE — Telephone Encounter (Signed)
Fax machines at [facility] not responding. Asked by Maurie Boettcher to Fax to nurse line [phone] on Monday 11/28/17

## 2017-11-27 NOTE — Telephone Encounter (Signed)
UC from ED 11/24/17 faxed to [facility] at [phone] to attn of nurse for Additional Abx of Keflex 500mg  PO BID x 7 days

## 2017-11-29 ENCOUNTER — Inpatient Hospital Stay: Payer: Medicaid Other | Attending: Nurse Practitioner | Admitting: Nurse Practitioner

## 2017-11-29 ENCOUNTER — Encounter: Payer: Self-pay | Admitting: Nurse Practitioner

## 2017-11-29 ENCOUNTER — Inpatient Hospital Stay: Payer: Medicaid Other

## 2017-11-29 ENCOUNTER — Telehealth: Payer: Self-pay | Admitting: Nurse Practitioner

## 2017-11-29 ENCOUNTER — Encounter: Payer: Self-pay | Admitting: Psychology

## 2017-11-29 VITALS — BP 108/66 | HR 73 | Temp 97.4°F | Resp 24 | Ht 67.0 in | Wt 150.0 lb

## 2017-11-29 DIAGNOSIS — R197 Diarrhea, unspecified: Secondary | ICD-10-CM | POA: Insufficient documentation

## 2017-11-29 DIAGNOSIS — F1020 Alcohol dependence, uncomplicated: Secondary | ICD-10-CM | POA: Insufficient documentation

## 2017-11-29 DIAGNOSIS — K7460 Unspecified cirrhosis of liver: Secondary | ICD-10-CM | POA: Diagnosis not present

## 2017-11-29 DIAGNOSIS — B1920 Unspecified viral hepatitis C without hepatic coma: Secondary | ICD-10-CM | POA: Diagnosis not present

## 2017-11-29 DIAGNOSIS — C220 Liver cell carcinoma: Secondary | ICD-10-CM

## 2017-11-29 DIAGNOSIS — K8020 Calculus of gallbladder without cholecystitis without obstruction: Secondary | ICD-10-CM | POA: Insufficient documentation

## 2017-11-29 DIAGNOSIS — Z79899 Other long term (current) drug therapy: Secondary | ICD-10-CM | POA: Diagnosis not present

## 2017-11-29 DIAGNOSIS — Z87891 Personal history of nicotine dependence: Secondary | ICD-10-CM | POA: Diagnosis not present

## 2017-11-29 LAB — MAGNESIUM: MAGNESIUM: 1.9 mg/dL (ref 1.5–2.5)

## 2017-11-29 LAB — COMPREHENSIVE METABOLIC PANEL
ALT: 34 U/L (ref 0–55)
AST: 27 U/L (ref 5–34)
Albumin: 3.1 g/dL — ABNORMAL LOW (ref 3.5–5.0)
Alkaline Phosphatase: 57 U/L (ref 40–150)
Anion gap: 4 (ref 3–11)
BUN: 5 mg/dL — ABNORMAL LOW (ref 7–26)
CALCIUM: 8.4 mg/dL (ref 8.4–10.4)
CHLORIDE: 114 mmol/L — AB (ref 98–109)
CO2: 22 mmol/L (ref 22–29)
CREATININE: 0.79 mg/dL (ref 0.70–1.30)
Glucose, Bld: 84 mg/dL (ref 70–140)
Potassium: 4 mmol/L (ref 3.5–5.1)
Sodium: 140 mmol/L (ref 136–145)
TOTAL PROTEIN: 6.2 g/dL — AB (ref 6.4–8.3)
Total Bilirubin: 0.6 mg/dL (ref 0.2–1.2)

## 2017-11-29 LAB — CBC WITH DIFFERENTIAL/PLATELET
Abs Granulocyte: 3.4 10*3/uL (ref 1.5–6.5)
BASOS ABS: 0.1 10*3/uL (ref 0.0–0.1)
BASOS PCT: 1 %
EOS ABS: 0.1 10*3/uL (ref 0.0–0.5)
EOS PCT: 2 %
HCT: 38.4 % (ref 38.4–49.9)
Hemoglobin: 12.6 g/dL — ABNORMAL LOW (ref 13.0–17.1)
LYMPHS PCT: 39 %
Lymphs Abs: 2.8 10*3/uL (ref 0.9–3.3)
MCH: 30.3 pg (ref 27.2–33.4)
MCHC: 32.7 g/dL (ref 32.0–36.0)
MCV: 92.6 fL (ref 79.3–98.0)
Monocytes Absolute: 0.7 10*3/uL (ref 0.1–0.9)
Monocytes Relative: 10 %
NEUTROS PCT: 48 %
Neutro Abs: 3.4 10*3/uL (ref 1.5–6.5)
PLATELETS: 130 10*3/uL — AB (ref 140–400)
RBC: 4.15 MIL/uL — AB (ref 4.20–5.82)
RDW: 17.1 % — ABNORMAL HIGH (ref 11.0–15.6)
WBC: 7.1 10*3/uL (ref 4.0–10.3)
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
## 2017-11-29 NOTE — Telephone Encounter (Signed)
Scheduled appt per 1/8 los - Gave patient AVS and calender per los.  

## 2017-12-01 ENCOUNTER — Ambulatory Visit (INDEPENDENT_AMBULATORY_CARE_PROVIDER_SITE_OTHER): Payer: Medicaid Other

## 2017-12-01 ENCOUNTER — Ambulatory Visit (INDEPENDENT_AMBULATORY_CARE_PROVIDER_SITE_OTHER): Payer: Medicaid Other | Admitting: Orthopedic Surgery

## 2017-12-01 ENCOUNTER — Encounter (INDEPENDENT_AMBULATORY_CARE_PROVIDER_SITE_OTHER): Payer: Self-pay | Admitting: Orthopedic Surgery

## 2017-12-01 DIAGNOSIS — M25562 Pain in left knee: Secondary | ICD-10-CM

## 2017-12-01 DIAGNOSIS — G8929 Other chronic pain: Secondary | ICD-10-CM

## 2017-12-01 DIAGNOSIS — M1732 Unilateral post-traumatic osteoarthritis, left knee: Secondary | ICD-10-CM
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
## 2017-12-06 ENCOUNTER — Other Ambulatory Visit: Payer: Self-pay | Admitting: Oncology

## 2017-12-06 DIAGNOSIS — C220 Liver cell carcinoma: Secondary | ICD-10-CM

## 2017-12-12 ENCOUNTER — Other Ambulatory Visit: Payer: Self-pay | Admitting: Pharmacist

## 2017-12-13 ENCOUNTER — Telehealth: Payer: Self-pay | Admitting: Family Medicine

## 2017-12-13 ENCOUNTER — Other Ambulatory Visit: Payer: Self-pay | Admitting: Family Medicine

## 2017-12-13 DIAGNOSIS — Z01810 Encounter for preprocedural cardiovascular examination: Secondary | ICD-10-CM

## 2017-12-13 NOTE — Telephone Encounter (Signed)
Received pre-operative clearance form from [facility] requesting medical clearance for patient to undergo (L) total knee arthroplasty. Patient has been cleared medically to proceed with procedure , although will require cardiology clearance in order to be cleared from a cardiac standpoint due to co-existing co-morbid conditions.   Morey Hummingbird, Please contact Mr. Hallas to advise, he will require an evaluation by a cardiologist prior to being cleared for surgery.  I am placing the referral to cardiology now. If he hasn't been contacted within the next 3-4 days, he will need to contact office in order for the referral to be expedited.   Carroll Sage. Kenton Kingfisher, MSN, FNP-C [facility]  [address] Barbara C[address] [place], [place] [address] [phone]

## 2017-12-14 NOTE — Telephone Encounter (Signed)
Patient notified and will give [place] a callback if they don't hear from cardiology

## 2017-12-14 NOTE — Telephone Encounter (Signed)
Left a vm for patient to callback 

## 2017-12-19 ENCOUNTER — Encounter: Payer: Self-pay | Admitting: Cardiology

## 2017-12-19 ENCOUNTER — Ambulatory Visit (INDEPENDENT_AMBULATORY_CARE_PROVIDER_SITE_OTHER): Payer: Medicaid Other | Admitting: Cardiology

## 2017-12-19 VITALS — BP 132/70 | HR 79 | Ht 67.0 in | Wt 149.0 lb

## 2017-12-19 DIAGNOSIS — Z01810 Encounter for preprocedural cardiovascular examination: Secondary | ICD-10-CM

## 2017-12-19 DIAGNOSIS — F17200 Nicotine dependence, unspecified, uncomplicated: Secondary | ICD-10-CM | POA: Diagnosis not present

## 2017-12-19 DIAGNOSIS — I10 Essential (primary) hypertension: Secondary | ICD-10-CM | POA: Diagnosis not present

## 2017-12-19 DIAGNOSIS — C220 Liver cell carcinoma: Secondary | ICD-10-CM | POA: Diagnosis not present

## 2017-12-19 DIAGNOSIS — B182 Chronic viral hepatitis C: Secondary | ICD-10-CM | POA: Diagnosis not present

## 2017-12-19 NOTE — Progress Notes (Signed)
Cardiology Office Note:    Date:  12/19/2017   ID:  Kyle Mejia, DOB 03-31-1956, MRN [id number]  PCP:  Scot Jun, FNP  Cardiologist:  Jenean Lindau, MD   Referring MD: Scot Jun, FNP    ASSESSMENT:    1. Pre-operative cardiovascular examination   2. Essential hypertension   3. Chronic hepatitis C without hepatic coma ([place])   4. Hepatocellular carcinoma ([place])   5. Tobacco dependence    PLAN:    In order of problems listed above:  1. I discussed my findings with the patient at extensive length.  His blood pressure stable.  I discussed with him issues regarding preoperative stratification.  Overall he leads a sedentary lifestyle.  He also has issues with his knee for which she is needing an evaluation.  Therefore in view of the above situation and multiple comorbidities I will obtain an echocardiogram for murmur heard on auscultation.  Will undergo Lexiscan sestamibi to assess any objective evidence of ischemic heart disease.  This is negative then he is not at high risk for coronary events during the aforementioned surgery.  Meticulous hemodynamic monitoring will further reduce the risk of coronary events.  I believe in view of his multiple comorbidities he is at least a moderate risk patient.  Will be seen in follow-up on a as needed basis.  I spent 5 minutes with the patient discussing solely about smoking. Smoking cessation was counseled. I suggested to the patient also different medications and pharmacological interventions. Patient is keen to try stopping on its own at this time. He will get back to me if he needs any further assistance in this matter.  Medication Adjustments/Labs and Tests Ordered: Current medicines are reviewed at length with the patient today.  Concerns regarding medicines are outlined above.  Orders Placed This Encounter  Procedures  . MYOCARDIAL PERFUSION IMAGING  . EKG 12-Lead  . ECHOCARDIOGRAM COMPLETE   No orders of the defined  types were placed in this encounter.    History of Present Illness:    Kyle Mejia is a 62 y.o. male who is being seen today for the evaluation of preop cardiovascular evaluation for risk stratification at the request of Scot Jun, FNP.  It is a 62 year old male with past medical history of essential hypertension, chronic hepatitis C and there is history of hepatocellular carcinoma.  Unfortunately continues to smoke.  He mentions to me that he has had a history of alcohol abuse but does not use alcohol at this time for the past several weeks or months.  He leads a sedentary lifestyle.  He denies chest pain orthopnea or PND.  At the time of my evaluation, the patient is alert awake oriented and in no distress.  He leads a sedentary lifestyle overall and is in generally frail health.  Patient does not give any history of any cardiac issues or has never had a cardiac evaluation in the past.  Past Medical History:  Diagnosis Date  . Alcoholism ([place])   . Cancer ([place]) 01/20/2017   hepatic  . Chronic left shoulder pain   . Chronic pain of left knee   . Depression   . Hepatitis C   . History of alcoholism ([place])   . Hypertension   . Left knee injury   . Neuropathy   . Tobacco dependence     Past Surgical History:  Procedure Laterality Date  . KNEE RECONSTRUCTION     left knee   .  knee surgery Left     Current Medications: Current Meds  Medication Sig  . clonazePAM (KLONOPIN) 0.5 MG tablet TAKE 0.5MG  BY twice per day (Patient taking differently: Take 0.5 mg by mouth 2 (two) times daily. )  . cloNIDine (CATAPRES) 0.2 MG tablet Take 1 tablet (0.2 mg total) by mouth 2 (two) times daily.  Marland Kitchen escitalopram (LEXAPRO) 10 MG tablet Take 1 tablet (10 mg total) by mouth at bedtime.  . folic acid (FOLVITE) 1 MG tablet Take 1 tablet (1 mg total) by mouth daily.  . hydrOXYzine (ATARAX/VISTARIL) 50 MG tablet Take 4 tablets (200 mg total) by mouth at bedtime.  Marland Kitchen lisinopril  (PRINIVIL,ZESTRIL) 10 MG tablet ONE TABLET BY MOUTH EACH DAY (Patient taking differently: Take 10 mg by mouth daily. )  . loperamide (IMODIUM) 2 MG capsule Take 2 capsules (4 mg total) by mouth 3 (three) times daily as needed for diarrhea or loose stools.  . Magnesium 400 MG TABS Take 400 mg by mouth 2 (two) times daily. For treatment of hypomagnesia  . megestrol (MEGACE) 40 MG/ML suspension Take 200 mg by mouth 2 (two) times daily.  . metoprolol succinate (TOPROL-XL) 25 MG 24 hr tablet Take 1 tablet (25 mg total) by mouth daily.  Marland Kitchen NEXAVAR 200 MG tablet TAKE 2 TABLETS (400MG ) BY MOUTH TWO TIMES DAILY. GIVE ON AN EMPTY STOMACH 1 HOUR BEFORE OR 2 HOURS AFTER MEALS.  Marland Kitchen omeprazole (PRILOSEC) 20 MG capsule Take 20 mg by mouth daily.  Marland Kitchen PARoxetine (PAXIL) 20 MG tablet Take 1 tablet (20 mg total) by mouth daily.     Allergies:   Patient has no known allergies.   Social History   Socioeconomic History  . Marital status: Single    Spouse name: None  . Number of children: None  . Years of education: None  . Highest education level: None  Social Needs  . Financial resource strain: None  . Food insecurity - worry: None  . Food insecurity - inability: None  . Transportation needs - medical: None  . Transportation needs - non-medical: None  Occupational History  . None  Tobacco Use  . Smoking status: Passive Smoke Exposure - Never Smoker  . Smokeless tobacco: Never Used  . Tobacco comment: cutting back 4or 5 a day  Substance and Sexual Activity  . Alcohol use: No    Comment: last drink 01/2017  . Drug use: No  . Sexual activity: Yes    Partners: Female    Birth control/protection: Condom  Other Topics Concern  . None  Social History Narrative   ** Merged History Encounter **         Family History: The patient's family history includes Alcoholism in his paternal uncle; Heart disease in his mother.  ROS:   Please see the history of present illness.    All other systems reviewed  and are negative.  EKGs/Labs/Other Studies Reviewed:    The following studies were reviewed today: I reviewed his office notes and EKG reveals sinus rhythm with nonspecific ST-T changes   Recent Labs: 06/14/2017: TSH 5.67 11/29/2017: ALT 34; BUN 5; Creatinine, Ser 0.79; Hemoglobin 12.6; Magnesium 1.9; Platelets 130; Potassium 4.0; Sodium 140  Recent Lipid Panel    Component Value Date/Time   CHOL 152 04/14/2017 1001   TRIG 99 04/14/2017 1001   HDL 33 (L) 04/14/2017 1001   CHOLHDL 4.6 04/14/2017 1001   VLDL 20 04/14/2017 1001   LDLCALC 99 04/14/2017 1001    Physical Exam:  VS:  BP 132/70 (BP Location: Left Arm, Patient Position: Sitting, Cuff Size: Normal)   Pulse 79   Ht 5\' 7"  (1.702 m)   Wt 149 lb (67.6 kg)   SpO2 98%   BMI 23.34 kg/m     Wt Readings from Last 3 Encounters:  12/19/17 149 lb (67.6 kg)  11/29/17 150 lb (68 kg)  11/24/17 146 lb (66.2 kg)     GEN: Patient is in no acute distress HEENT: Normal NECK: No JVD; No carotid bruits LYMPHATICS: No lymphadenopathy CARDIAC: S1 S2 regular, 2/6 systolic murmur at the apex. RESPIRATORY:  Clear to auscultation without rales, wheezing or rhonchi  ABDOMEN: Soft, non-tender, non-distended MUSCULOSKELETAL:  No edema; No deformity  SKIN: Warm and dry NEUROLOGIC:  Alert and oriented x 3 PSYCHIATRIC:  Normal affect    Signed, Jenean Lindau, MD  12/19/2017 11:07 AM    [place]

## 2017-12-19 NOTE — Patient Instructions (Addendum)
Medication Instructions:  Your physician recommends that you continue on your current medications as directed. Please refer to the Current Medication list given to you today.  Labwork: None  Testing/Procedures: Your physician has requested that you have an echocardiogram. Echocardiography is a painless test that uses sound waves to create images of your heart. It provides your doctor with information about the size and shape of your heart and how well your heart's chambers and valves are working. This procedure takes approximately one hour. There are no restrictions for this procedure.  Your physician has requested that you have a lexiscan myoview. For further information please visit [email or web address]. Please follow instruction sheet, as given.  Follow-Up: Your physician recommends that you schedule a follow-up appointment in: as needed  Any Other Special Instructions Will Be Listed Below (If Applicable).     If you need a refill on your cardiac medications before your next appointment, please call your pharmacy.   [facility]  Ashley A, RN, BSN   Echocardiogram An echocardiogram, or echocardiography, uses sound waves (ultrasound) to produce an image of your heart. The echocardiogram is simple, painless, obtained within a short period of time, and offers valuable information to your health care provider. The images from an echocardiogram can provide information such as:  Evidence of coronary artery disease (CAD).  Heart size.  Heart muscle function.  Heart valve function.  Aneurysm detection.  Evidence of a past heart attack.  Fluid buildup around the heart.  Heart muscle thickening.  Assess heart valve function.  Tell a health care provider about:  Any allergies you have.  All medicines you are taking, including vitamins, herbs, eye drops, creams, and over-the-counter medicines.  Any problems you or family members have had with anesthetic  medicines.  Any blood disorders you have.  Any surgeries you have had.  Any medical conditions you have.  Whether you are pregnant or may be pregnant. What happens before the procedure? No special preparation is needed. Eat and drink normally. What happens during the procedure?  In order to produce an image of your heart, gel will be applied to your chest and a wand-like tool (transducer) will be moved over your chest. The gel will help transmit the sound waves from the transducer. The sound waves will harmlessly bounce off your heart to allow the heart images to be captured in real-time motion. These images will then be recorded.  You may need an IV to receive a medicine that improves the quality of the pictures. What happens after the procedure? You may return to your normal schedule including diet, activities, and medicines, unless your health care provider tells you otherwise. This information is not intended to replace advice given to you by your health care provider. Make sure you discuss any questions you have with your health care provider. Document Released: 11/05/2000 Document Revised: 06/26/2016 Document Reviewed: 07/16/2013 Elsevier Interactive Patient Education  2017 Elsevier Inc.  

## 2017-12-20 ENCOUNTER — Encounter: Payer: Self-pay | Admitting: Oncology

## 2017-12-20 ENCOUNTER — Inpatient Hospital Stay: Payer: Medicaid Other

## 2017-12-20 ENCOUNTER — Inpatient Hospital Stay (HOSPITAL_BASED_OUTPATIENT_CLINIC_OR_DEPARTMENT_OTHER): Payer: Medicaid Other | Admitting: Oncology

## 2017-12-20 ENCOUNTER — Telehealth: Payer: Self-pay | Admitting: Oncology

## 2017-12-20 VITALS — BP 132/82 | HR 65 | Temp 97.6°F | Resp 18 | Ht 67.0 in

## 2017-12-20 DIAGNOSIS — R197 Diarrhea, unspecified: Secondary | ICD-10-CM

## 2017-12-20 DIAGNOSIS — C220 Liver cell carcinoma: Secondary | ICD-10-CM | POA: Diagnosis not present

## 2017-12-20 DIAGNOSIS — F1020 Alcohol dependence, uncomplicated: Secondary | ICD-10-CM | POA: Diagnosis not present

## 2017-12-20 DIAGNOSIS — K7460 Unspecified cirrhosis of liver: Secondary | ICD-10-CM

## 2017-12-20 DIAGNOSIS — Z79899 Other long term (current) drug therapy: Secondary | ICD-10-CM | POA: Diagnosis not present

## 2017-12-20 LAB — CMP (CANCER CENTER ONLY)
ALK PHOS: 77 U/L (ref 40–150)
ALT: 38 U/L (ref 0–55)
AST: 26 U/L (ref 5–34)
Albumin: 3.6 g/dL (ref 3.5–5.0)
Anion gap: 9 (ref 3–11)
BUN: 15 mg/dL (ref 7–26)
CALCIUM: 9 mg/dL (ref 8.4–10.4)
CO2: 20 mmol/L — AB (ref 22–29)
Chloride: 111 mmol/L — ABNORMAL HIGH (ref 98–109)
Creatinine: 0.73 mg/dL (ref 0.70–1.30)
GFR, Estimated: >60 mL/min (ref >60)
Glucose, Bld: 90 mg/dL (ref 70–140)
Potassium: 4.2 mmol/L (ref 3.5–5.1)
SODIUM: 140 mmol/L (ref 136–145)
Total Bilirubin: 0.6 mg/dL (ref 0.2–1.2)
Total Protein: 7.4 g/dL (ref 6.4–8.3)

## 2017-12-20 LAB — CBC WITH DIFFERENTIAL (CANCER CENTER ONLY)
BASOS PCT: 1 %
Basophils Absolute: 0 10*3/uL (ref 0.0–0.1)
EOS ABS: 0.2 10*3/uL (ref 0.0–0.5)
EOS PCT: 2 %
HCT: 44.3 % (ref 38.4–49.9)
HEMOGLOBIN: 14.6 g/dL (ref 13.0–17.1)
Lymphocytes Relative: 37 %
Lymphs Abs: 3.1 10*3/uL (ref 0.9–3.3)
MCH: 30.4 pg (ref 27.2–33.4)
MCHC: 33 g/dL (ref 32.0–36.0)
MCV: 92.1 fL (ref 79.3–98.0)
MONO ABS: 0.8 10*3/uL (ref 0.1–0.9)
MONOS PCT: 10 %
NEUTROS PCT: 50 %
Neutro Abs: 4.3 10*3/uL (ref 1.5–6.5)
Platelet Count: 144 10*3/uL (ref 140–400)
RBC: 4.81 MIL/uL (ref 4.20–5.82)
RDW: 15.7 % — AB (ref 11.0–15.6)
WBC Count: 8.4 10*3/uL (ref 4.0–10.3)
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
## 2017-12-20 NOTE — Telephone Encounter (Signed)
Scheduled appt per 1/29 los - Gave patient aVS and calender per los.  

## 2017-12-21 ENCOUNTER — Telehealth (HOSPITAL_COMMUNITY): Payer: Self-pay | Admitting: *Deleted

## 2017-12-21 LAB — AFP TUMOR MARKER: AFP, Serum, Tumor Marker: 1583 ng/mL — ABNORMAL HIGH (ref 0.0–8.3)

## 2017-12-21 NOTE — Telephone Encounter (Signed)
Left message on voicemail per DPR in reference to upcoming appointment scheduled on 12/26/17 with detailed instructions given per Myocardial Perfusion Study Information Sheet for the test. LM to arrive 15 minutes early, and that it is imperative to arrive on time for appointment to keep from having the test rescheduled. If you need to cancel or reschedule your appointment, please call the office within 24 hours of your appointment. Failure to do so may result in a cancellation of your appointment, and a $50 no show fee. Phone number given for call back for any questions. Malinda Mayden Jacqueline    

## 2017-12-22 ENCOUNTER — Encounter: Payer: Self-pay | Admitting: Psychology

## 2017-12-23 ENCOUNTER — Encounter: Payer: Self-pay | Admitting: Family Medicine

## 2017-12-23 ENCOUNTER — Ambulatory Visit (INDEPENDENT_AMBULATORY_CARE_PROVIDER_SITE_OTHER): Payer: Medicaid Other | Admitting: Family Medicine

## 2017-12-23 VITALS — BP 110/66 | HR 65 | Temp 98.0°F | Resp 14 | Ht 67.0 in | Wt 145.8 lb

## 2017-12-23 DIAGNOSIS — R197 Diarrhea, unspecified: Secondary | ICD-10-CM

## 2017-12-23 DIAGNOSIS — Z1389 Encounter for screening for other disorder: Secondary | ICD-10-CM

## 2017-12-23 LAB — POCT URINALYSIS DIP (DEVICE)
Bilirubin Urine: NEGATIVE
GLUCOSE, UA: NEGATIVE mg/dL
Hgb urine dipstick: NEGATIVE
Ketones, ur: NEGATIVE mg/dL
Leukocytes, UA: NEGATIVE
NITRITE: NEGATIVE
PH: 5.5 (ref 5.0–8.0)
PROTEIN: NEGATIVE mg/dL
Specific Gravity, Urine: 1.02 (ref 1.005–1.030)
Urobilinogen, UA: 0.2 mg/dL (ref 0.0–1.0)

## 2017-12-23 MED ORDER — LOPERAMIDE HCL 2 MG PO CAPS: 4.0000 mg | ORAL_CAPSULE | Freq: Three times a day (TID) | ORAL | 1 refills | Status: DC | PRN

## 2017-12-23 NOTE — Patient Instructions (Signed)
Please supply me with a stool sample at your earliest convenience in order for me to screen you for an infectious process.
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
## 2017-12-26 ENCOUNTER — Ambulatory Visit (HOSPITAL_BASED_OUTPATIENT_CLINIC_OR_DEPARTMENT_OTHER): Payer: Medicaid Other

## 2017-12-26 ENCOUNTER — Other Ambulatory Visit: Payer: Self-pay

## 2017-12-26 ENCOUNTER — Ambulatory Visit (HOSPITAL_COMMUNITY): Payer: Medicaid Other | Attending: Cardiovascular Disease

## 2017-12-26 DIAGNOSIS — I313 Pericardial effusion (noninflammatory): Secondary | ICD-10-CM | POA: Diagnosis not present

## 2017-12-26 DIAGNOSIS — I10 Essential (primary) hypertension: Secondary | ICD-10-CM | POA: Insufficient documentation

## 2017-12-26 DIAGNOSIS — Z01810 Encounter for preprocedural cardiovascular examination: Secondary | ICD-10-CM

## 2017-12-26 LAB — MYOCARDIAL PERFUSION IMAGING
CHL CUP NUCLEAR SSS: 5
LVDIAVOL: 58 mL (ref 62–150)
LVSYSVOL: 23 mL
Peak HR: 84 {beats}/min
RATE: 0.19
Rest HR: 59 {beats}/min
SDS: 2
SRS: 3
TID: 1.08

## 2017-12-26 MED ORDER — REGADENOSON 0.4 MG/5ML IV SOLN
0.4000 mg | Freq: Once | INTRAVENOUS | Status: AC
  Administered 2017-12-26: 0.4 mg via INTRAVENOUS

## 2017-12-26 MED ORDER — TECHNETIUM TC 99M TETROFOSMIN IV KIT
10.2000 | PACK | Freq: Once | INTRAVENOUS | Status: AC | PRN
  Administered 2017-12-26: 10.2 via INTRAVENOUS
  Filled 2017-12-26: qty 11

## 2017-12-26 MED ORDER — TECHNETIUM TC 99M TETROFOSMIN IV KIT
31.6000 | PACK | Freq: Once | INTRAVENOUS | Status: AC | PRN
  Administered 2017-12-26: 31.6 via INTRAVENOUS
  Filled 2017-12-26: qty 32

## 2017-12-27 ENCOUNTER — Encounter (HOSPITAL_COMMUNITY): Payer: Self-pay | Admitting: *Deleted

## 2017-12-30 MED FILL — NexAVAR 200 MG TABS: 200 | 30 days supply | Qty: 120 | Fill #0

## 2018-01-09 ENCOUNTER — Encounter: Payer: Self-pay | Admitting: Nurse Practitioner

## 2018-01-09 ENCOUNTER — Inpatient Hospital Stay: Payer: Medicaid Other

## 2018-01-09 ENCOUNTER — Inpatient Hospital Stay: Payer: Medicaid Other | Attending: Nurse Practitioner | Admitting: Nurse Practitioner

## 2018-01-09 ENCOUNTER — Telehealth: Payer: Self-pay | Admitting: Nurse Practitioner

## 2018-01-09 VITALS — BP 116/78 | HR 72 | Temp 98.9°F | Resp 19 | Ht 67.0 in | Wt 148.7 lb

## 2018-01-09 DIAGNOSIS — Z87891 Personal history of nicotine dependence: Secondary | ICD-10-CM | POA: Insufficient documentation

## 2018-01-09 DIAGNOSIS — C220 Liver cell carcinoma: Secondary | ICD-10-CM | POA: Insufficient documentation

## 2018-01-09 DIAGNOSIS — K7460 Unspecified cirrhosis of liver: Secondary | ICD-10-CM | POA: Diagnosis not present

## 2018-01-09 DIAGNOSIS — K8020 Calculus of gallbladder without cholecystitis without obstruction: Secondary | ICD-10-CM | POA: Insufficient documentation

## 2018-01-09 DIAGNOSIS — F1020 Alcohol dependence, uncomplicated: Secondary | ICD-10-CM

## 2018-01-09 DIAGNOSIS — R197 Diarrhea, unspecified: Secondary | ICD-10-CM | POA: Insufficient documentation

## 2018-01-09 DIAGNOSIS — B1920 Unspecified viral hepatitis C without hepatic coma: Secondary | ICD-10-CM | POA: Diagnosis not present

## 2018-01-09 DIAGNOSIS — Z79899 Other long term (current) drug therapy: Secondary | ICD-10-CM | POA: Insufficient documentation

## 2018-01-09 LAB — CBC WITH DIFFERENTIAL (CANCER CENTER ONLY)
BASOS ABS: 0 10*3/uL (ref 0.0–0.1)
BASOS PCT: 0 %
EOS ABS: 0.3 10*3/uL (ref 0.0–0.5)
EOS PCT: 4 %
HCT: 38 % — ABNORMAL LOW (ref 38.4–49.9)
Hemoglobin: 12.9 g/dL — ABNORMAL LOW (ref 13.0–17.1)
LYMPHS ABS: 2.4 10*3/uL (ref 0.9–3.3)
Lymphocytes Relative: 36 %
MCH: 30.9 pg (ref 27.2–33.4)
MCHC: 33.9 g/dL (ref 32.0–36.0)
MCV: 90.9 fL (ref 79.3–98.0)
Monocytes Absolute: 0.6 10*3/uL (ref 0.1–0.9)
Monocytes Relative: 9 %
Neutro Abs: 3.5 10*3/uL (ref 1.5–6.5)
Neutrophils Relative %: 51 %
PLATELETS: 107 10*3/uL — AB (ref 140–400)
RBC: 4.18 MIL/uL — ABNORMAL LOW (ref 4.20–5.82)
RDW: 15.3 % — ABNORMAL HIGH (ref 11.0–14.6)
WBC: 6.8 10*3/uL (ref 4.0–10.3)

## 2018-01-09 LAB — CMP (CANCER CENTER ONLY)
ALT: 35 U/L (ref 0–55)
AST: 24 U/L (ref 5–34)
Albumin: 3 g/dL — ABNORMAL LOW (ref 3.5–5.0)
Alkaline Phosphatase: 80 U/L (ref 40–150)
Anion gap: 8 (ref 3–11)
BUN: 8 mg/dL (ref 7–26)
CHLORIDE: 116 mmol/L — AB (ref 98–109)
CO2: 20 mmol/L — AB (ref 22–29)
CREATININE: 0.72 mg/dL (ref 0.70–1.30)
Calcium: 8.2 mg/dL — ABNORMAL LOW (ref 8.4–10.4)
GFR, Estimated: >60 mL/min (ref >60)
Glucose, Bld: 131 mg/dL (ref 70–140)
Potassium: 3.2 mmol/L — ABNORMAL LOW (ref 3.5–5.1)
SODIUM: 144 mmol/L (ref 136–145)
Total Bilirubin: 0.7 mg/dL (ref 0.2–1.2)
Total Protein: 6.2 g/dL — ABNORMAL LOW (ref 6.4–8.3)

## 2018-01-09 LAB — MAGNESIUM: Magnesium: 1.5 mg/dL (ref 1.5–2.5)

## 2018-01-09 MED ORDER — LOPERAMIDE HCL 2 MG PO CAPS: ORAL_CAPSULE | ORAL | 1 refills | Status: DC

## 2018-01-09 MED ORDER — POTASSIUM CHLORIDE CRYS ER 20 MEQ PO TBCR: 20.0000 meq | EXTENDED_RELEASE_TABLET | Freq: Every day | ORAL | 1 refills | Status: DC
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
## 2018-01-09 NOTE — Telephone Encounter (Signed)
Scheduled appt per 2/18 los - Gave patient AVS and calender per los.

## 2018-01-10 LAB — AFP TUMOR MARKER: AFP, Serum, Tumor Marker: 1679 ng/mL — ABNORMAL HIGH (ref 0.0–8.3)

## 2018-01-13 ENCOUNTER — Encounter: Payer: Self-pay | Admitting: Internal Medicine

## 2018-01-24 MED FILL — NexAVAR 200 MG TABS: 200 | 30 days supply | Qty: 120 | Fill #1

## 2018-01-26 ENCOUNTER — Telehealth: Payer: Self-pay

## 2018-01-27 ENCOUNTER — Telehealth: Payer: Self-pay | Admitting: Family Medicine

## 2018-01-27 DIAGNOSIS — C220 Liver cell carcinoma: Secondary | ICD-10-CM

## 2018-01-27 DIAGNOSIS — R197 Diarrhea, unspecified: Secondary | ICD-10-CM

## 2018-01-27 MED ORDER — LOPERAMIDE HCL 2 MG PO CAPS: ORAL_CAPSULE | ORAL | 1 refills | Status: AC

## 2018-01-27 MED ORDER — DICYCLOMINE HCL 20 MG PO TABS: 20.0000 mg | ORAL_TABLET | Freq: Three times a day (TID) | ORAL | 0 refills | Status: DC

## 2018-01-27 NOTE — Telephone Encounter (Signed)
Contact Karen at facility regarding patient experiencing excessive diarrhea. Patient was to return stool sample, although he never returned specimen. She will send someone from the facility today to pick-up specimen cups and prescriptions for bentyl and imodium.   Carroll Sage. Kenton Kingfisher, MSN, FNP-C [facility]  [address] [place], [place] [address] [phone]

## 2018-02-06 ENCOUNTER — Telehealth: Payer: Self-pay | Admitting: Oncology

## 2018-02-06 ENCOUNTER — Encounter: Payer: Self-pay | Admitting: Nurse Practitioner

## 2018-02-06 ENCOUNTER — Inpatient Hospital Stay: Payer: Medicaid Other

## 2018-02-06 ENCOUNTER — Inpatient Hospital Stay: Payer: Medicaid Other | Attending: Nurse Practitioner | Admitting: Nurse Practitioner

## 2018-02-06 VITALS — BP 126/64 | HR 55 | Temp 98.6°F | Resp 17 | Ht 67.0 in | Wt 146.9 lb

## 2018-02-06 DIAGNOSIS — F1020 Alcohol dependence, uncomplicated: Secondary | ICD-10-CM | POA: Insufficient documentation

## 2018-02-06 DIAGNOSIS — Z87820 Personal history of traumatic brain injury: Secondary | ICD-10-CM | POA: Insufficient documentation

## 2018-02-06 DIAGNOSIS — C220 Liver cell carcinoma: Secondary | ICD-10-CM

## 2018-02-06 DIAGNOSIS — R197 Diarrhea, unspecified: Secondary | ICD-10-CM | POA: Insufficient documentation

## 2018-02-06 DIAGNOSIS — K8020 Calculus of gallbladder without cholecystitis without obstruction: Secondary | ICD-10-CM | POA: Insufficient documentation

## 2018-02-06 DIAGNOSIS — B1920 Unspecified viral hepatitis C without hepatic coma: Secondary | ICD-10-CM | POA: Insufficient documentation

## 2018-02-06 DIAGNOSIS — Z87891 Personal history of nicotine dependence: Secondary | ICD-10-CM | POA: Diagnosis not present

## 2018-02-06 LAB — CMP (CANCER CENTER ONLY)
ALBUMIN: 2.7 g/dL — AB (ref 3.5–5.0)
ALT: 28 U/L (ref 0–55)
ANION GAP: 8 (ref 3–11)
AST: 27 U/L (ref 5–34)
Alkaline Phosphatase: 112 U/L (ref 40–150)
BUN: 8 mg/dL (ref 7–26)
CALCIUM: 8.2 mg/dL — AB (ref 8.4–10.4)
CO2: 22 mmol/L (ref 22–29)
Chloride: 113 mmol/L — ABNORMAL HIGH (ref 98–109)
Creatinine: 0.72 mg/dL (ref 0.70–1.30)
GFR, Est AFR Am: >60 mL/min (ref >60)
GFR, Estimated: >60 mL/min (ref >60)
GLUCOSE: 118 mg/dL (ref 70–140)
Potassium: 3.1 mmol/L — ABNORMAL LOW (ref 3.5–5.1)
SODIUM: 143 mmol/L (ref 136–145)
Total Bilirubin: 0.7 mg/dL (ref 0.2–1.2)
Total Protein: 6.3 g/dL — ABNORMAL LOW (ref 6.4–8.3)

## 2018-02-06 LAB — CBC WITH DIFFERENTIAL (CANCER CENTER ONLY)
BASOS PCT: 1 %
Basophils Absolute: 0 10*3/uL (ref 0.0–0.1)
EOS PCT: 2 %
Eosinophils Absolute: 0.1 10*3/uL (ref 0.0–0.5)
HCT: 35.8 % — ABNORMAL LOW (ref 38.4–49.9)
Hemoglobin: 12 g/dL — ABNORMAL LOW (ref 13.0–17.1)
LYMPHS PCT: 28 %
Lymphs Abs: 1.7 10*3/uL (ref 0.9–3.3)
MCH: 30.4 pg (ref 27.2–33.4)
MCHC: 33.5 g/dL (ref 32.0–36.0)
MCV: 90.6 fL (ref 79.3–98.0)
MONO ABS: 0.5 10*3/uL (ref 0.1–0.9)
MONOS PCT: 7 %
NEUTROS ABS: 4 10*3/uL (ref 1.5–6.5)
Neutrophils Relative %: 62 %
Platelet Count: 142 10*3/uL (ref 140–400)
RBC: 3.95 MIL/uL — ABNORMAL LOW (ref 4.20–5.82)
RDW: 15.2 % — AB (ref 11.0–14.6)
WBC Count: 6.3 10*3/uL (ref 4.0–10.3)

## 2018-02-06 NOTE — Telephone Encounter (Signed)
Appointments scheduled AVS/Calendar printed per 3/18 los °
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
## 2018-02-07 LAB — AFP TUMOR MARKER: AFP, Serum, Tumor Marker: 1761 ng/mL — ABNORMAL HIGH (ref 0.0–8.3)

## 2018-02-13 ENCOUNTER — Other Ambulatory Visit: Payer: Self-pay

## 2018-02-13 ENCOUNTER — Encounter (HOSPITAL_COMMUNITY): Payer: Self-pay

## 2018-02-13 ENCOUNTER — Inpatient Hospital Stay (HOSPITAL_COMMUNITY)
Admission: RE | Admit: 2018-02-13 | Discharge: 2018-02-13 | Disposition: A | Discharge status: Home / Self Care | Payer: Self-pay | Source: Ambulatory Visit

## 2018-02-13 HISTORY — DX: Other specified disorders of bone density and structure, unspecified site: M85.80

## 2018-02-13 HISTORY — DX: Unspecified osteoarthritis, unspecified site: M19.90

## 2018-02-13 HISTORY — DX: Insomnia, unspecified: G47.00

## 2018-02-13 NOTE — Progress Notes (Signed)
PCP - NP Molli Barrows  Cardiologist - Denies  Onc- NP Ned Card  Nurse- Santiago Glad [phone] from [facility]  Chest x-ray - Denies  EKG - 12/19/17 (E)  Stress Test - 12/26/17 (E)  ECHO - Denies  Cardiac Cath - Denies  Sleep Study - Denies CPAP - None  LABS- 02/15/18: CBC, CMP  Anesthesia- NO  Pt denies having chest pain, sob, or fever during the pre-op phone call. All instructions explained to Santiago Glad, Therapist, sports from [facility] including as of today, stop taking all Aspirins, Vitamins, Fish oils, and Herbal medications. Also stop all NSAIDS i.e. Advil, Ibuprofen, Motrin, Aleve, Anaprox, Naproxen, BC and Goody Powders. Santiago Glad also given instructions for pt not to smoke or drink alcohol 24 hrs before and after surgery. Santiago Glad was faxed per-op instructions, and confirmed that they were received at 1539. Santiago Glad also asked to send a MAR with the pt the day of surgery with meds given for that day.The opportunity to ask questions was provided.

## 2018-02-13 NOTE — Pre-Procedure Instructions (Signed)
Kyle Mejia  02/13/2018      [address] [place] [address] Phone: [phone] Fax: [phone]  [facility] [place], [place] [address] Phone: [phone] Fax: [phone]    Your procedure is scheduled on Wed., February 15, 2018  Report to [facility] Admitting Entrance "A" at 5:30AM  Call this number if you have problems the morning of surgery:  [phone]   Remember:  Do not eat food or drink liquids after midnight.   Take these medicines the morning of surgery with A SIP OF WATER: ClonazePAM (KLONOPIN), CloNIDine (CATAPRES), Metoprolol succinate (TOPROL-XL),  Omeprazole (PRILOSEC), PARoxetine (PAXIL), and NEXAVAR. If needed  Loperamide (IMODIUM) for diarrhea and Albuterol Inhaler for cough or wheezing (Bring with you the day of surgery).  As of today, stop taking all Aspirins, Vitamins, Fish oils, and Herbal medications. Also stop all NSAIDS i.e. Advil, Ibuprofen, Motrin, Aleve, Anaprox, Naproxen, BC and Goody Powders.   Do not wear jewelry.  Do not wear lotions, powders,colognes, or deodorant.  Do not shave 48 hours prior to surgery.  Men may shave face and neck.  Do not bring valuables to the hospital.  [facility] is not responsible for any belongings or valuables.  Contacts, dentures or bridgework may not be worn into surgery.  Leave your suitcase in the car.  After surgery it may be brought to your room.  For patients admitted to the hospital, discharge time will be determined by your treatment team.  Patients discharged the day of surgery will not be allowed to drive home.   Special instructions:  [facility]- Preparing For Surgery  Before surgery, you can play an important role. Because skin is not sterile, your skin needs to be as free of germs as  possible. You can reduce the number of germs on your skin by washing with CHG (chlorahexidine gluconate) Soap before surgery.  CHG is an antiseptic cleaner which kills germs and bonds with the skin to continue killing germs even after washing.  Please do not use if you have an allergy to CHG or antibacterial soaps. If your skin becomes reddened/irritated stop using the CHG.  Do not shave (including legs and underarms) for at least 48 hours prior to first CHG shower. It is OK to shave your face.  Please follow these instructions carefully.   1. Shower the NIGHT BEFORE SURGERY and the MORNING OF SURGERY with CHG.   2. If you chose to wash your hair, wash your hair first as usual with your normal shampoo.  3. After you shampoo, rinse your hair and body thoroughly to remove the shampoo.  4. Use CHG as you would any other liquid soap. You can apply CHG directly to the skin and wash gently with a scrungie or a clean washcloth.   5. Apply the CHG Soap to your body ONLY FROM THE NECK DOWN.  Do not use on open wounds or open sores. Avoid contact with your eyes, ears, mouth and genitals (private parts). Wash Face and genitals (private parts)  with your normal soap.  6. Wash thoroughly, paying special attention to the area where your surgery will be performed.  7. Thoroughly rinse your body with warm water from the neck down.  8. DO NOT shower/wash with your normal soap after using  and rinsing off the CHG Soap.  9. Pat yourself dry with a CLEAN TOWEL.  10. Wear CLEAN PAJAMAS to bed the night before surgery, wear comfortable clothes the morning of surgery  11. Place CLEAN SHEETS on your bed the night of your first shower and DO NOT SLEEP WITH PETS.  Day of Surgery: Do not apply any deodorants/lotions. Please wear clean clothes to the hospital/surgery center.    Please read over the following fact sheets that you were given. Pain Booklet, Coughing and Deep Breathing, Total Joint Packet, MRSA  Information and Surgical Site Infection Prevention

## 2018-02-14 ENCOUNTER — Encounter (HOSPITAL_COMMUNITY): Payer: Self-pay | Admitting: Anesthesiology

## 2018-02-14 ENCOUNTER — Other Ambulatory Visit (INDEPENDENT_AMBULATORY_CARE_PROVIDER_SITE_OTHER): Payer: Self-pay | Admitting: Orthopedic Surgery

## 2018-02-14 DIAGNOSIS — M1712 Unilateral primary osteoarthritis, left knee: Secondary | ICD-10-CM

## 2018-02-14 NOTE — Anesthesia Preprocedure Evaluation (Addendum)
Anesthesia Evaluation  Patient identified by MRN, date of birth, ID band Patient awake    Reviewed: Allergy & Precautions, NPO status , Patient's Chart, lab work & pertinent test results  Airway Mallampati: I       Dental  (+) Poor Dentition, Missing, Dental Advisory Given   Pulmonary Current Smoker,    Pulmonary exam normal breath sounds clear to auscultation       Cardiovascular hypertension, Pt. on medications and Pt. on home beta blockers Normal cardiovascular exam Rhythm:Regular Rate:Normal     Neuro/Psych PSYCHIATRIC DISORDERS Depression    GI/Hepatic (+)     substance abuse  alcohol use, Hepatitis -, C  Endo/Other  negative endocrine ROS  Renal/GU      Musculoskeletal   Abdominal Normal abdominal exam  (+)   Peds  Hematology negative hematology ROS (+)   Anesthesia Other Findings   Reproductive/Obstetrics                           Anesthesia Physical Anesthesia Plan  ASA: II  Anesthesia Plan: General   Post-op Pain Management: GA combined w/ Regional for post-op pain   Induction: Intravenous  PONV Risk Score and Plan:   Airway Management Planned: Oral ETT and LMA  Additional Equipment:   Intra-op Plan:   Post-operative Plan: Extubation in OR  Informed Consent: I have reviewed the patients History and Physical, chart, labs and discussed the procedure including the risks, benefits and alternatives for the proposed anesthesia with the patient or authorized representative who has indicated his/her understanding and acceptance.   Dental advisory given  Plan Discussed with: CRNA and Surgeon  Anesthesia Plan Comments:        Anesthesia Quick Evaluation

## 2018-02-15 ENCOUNTER — Other Ambulatory Visit: Payer: Self-pay

## 2018-02-15 ENCOUNTER — Inpatient Hospital Stay (HOSPITAL_COMMUNITY)
Admission: RE | Admit: 2018-02-15 | Discharge: 2018-02-22 | DRG: 470 | Disposition: A | Discharge status: Skilled Nursing Facility | Payer: Medicaid Other | Source: Ambulatory Visit | Attending: Orthopedic Surgery | Admitting: Orthopedic Surgery

## 2018-02-15 ENCOUNTER — Other Ambulatory Visit: Payer: Self-pay | Admitting: Oncology

## 2018-02-15 ENCOUNTER — Encounter (HOSPITAL_COMMUNITY): Payer: Self-pay | Admitting: *Deleted

## 2018-02-15 ENCOUNTER — Inpatient Hospital Stay (HOSPITAL_COMMUNITY): Payer: Medicaid Other | Admitting: Anesthesiology

## 2018-02-15 ENCOUNTER — Encounter (HOSPITAL_COMMUNITY)
Admission: RE | Disposition: A | Discharge status: Skilled Nursing Facility | Payer: Self-pay | Source: Ambulatory Visit | Attending: Orthopedic Surgery

## 2018-02-15 DIAGNOSIS — Z96652 Presence of left artificial knee joint: Secondary | ICD-10-CM

## 2018-02-15 DIAGNOSIS — M1712 Unilateral primary osteoarthritis, left knee: Secondary | ICD-10-CM | POA: Diagnosis not present

## 2018-02-15 DIAGNOSIS — C220 Liver cell carcinoma: Secondary | ICD-10-CM

## 2018-02-15 DIAGNOSIS — M1732 Unilateral post-traumatic osteoarthritis, left knee: Secondary | ICD-10-CM | POA: Diagnosis present

## 2018-02-15 DIAGNOSIS — I10 Essential (primary) hypertension: Secondary | ICD-10-CM | POA: Diagnosis present

## 2018-02-15 DIAGNOSIS — F17210 Nicotine dependence, cigarettes, uncomplicated: Secondary | ICD-10-CM | POA: Diagnosis present

## 2018-02-15 HISTORY — PX: TOTAL KNEE ARTHROPLASTY: SHX125

## 2018-02-15 LAB — COMPREHENSIVE METABOLIC PANEL
ALT: 36 U/L (ref 17–63)
AST: 34 U/L (ref 15–41)
Albumin: 3.2 g/dL — ABNORMAL LOW (ref 3.5–5.0)
Alkaline Phosphatase: 114 U/L (ref 38–126)
Anion gap: 9 (ref 5–15)
BUN: 10 mg/dL (ref 6–20)
CHLORIDE: 110 mmol/L (ref 101–111)
CO2: 20 mmol/L — AB (ref 22–32)
CREATININE: 0.76 mg/dL (ref 0.61–1.24)
Calcium: 8.8 mg/dL — ABNORMAL LOW (ref 8.9–10.3)
GFR calc non Af Amer: >60 mL/min (ref >60)
Glucose, Bld: 92 mg/dL (ref 65–99)
Potassium: 4 mmol/L (ref 3.5–5.1)
SODIUM: 139 mmol/L (ref 135–145)
Total Bilirubin: 1.1 mg/dL (ref 0.3–1.2)
Total Protein: 7 g/dL (ref 6.5–8.1)

## 2018-02-15 LAB — CBC
HCT: 43.7 % (ref 39.0–52.0)
Hemoglobin: 14.2 g/dL (ref 13.0–17.0)
MCH: 31 pg (ref 26.0–34.0)
MCHC: 32.5 g/dL (ref 30.0–36.0)
MCV: 95.4 fL (ref 78.0–100.0)
PLATELETS: 184 10*3/uL (ref 150–400)
RBC: 4.58 MIL/uL (ref 4.22–5.81)
RDW: 15.1 % (ref 11.5–15.5)
WBC: 9.8 10*3/uL (ref 4.0–10.5)

## 2018-02-15 LAB — APTT: aPTT: 34 seconds (ref 24–36)

## 2018-02-15 LAB — PROTIME-INR
INR: 1.23
Prothrombin Time: 15.4 seconds — ABNORMAL HIGH (ref 11.4–15.2)

## 2018-02-15 SURGERY — ARTHROPLASTY, KNEE, TOTAL
Anesthesia: Regional | Laterality: Left

## 2018-02-15 MED ORDER — ASPIRIN EC 325 MG PO TBEC
325.0000 mg | DELAYED_RELEASE_TABLET | Freq: Every day | ORAL | Status: DC
  Administered 2018-02-16 – 2018-02-22 (×7): 325 mg via ORAL
  Filled 2018-02-15 (×7): qty 1

## 2018-02-15 MED ORDER — BISACODYL 10 MG RE SUPP: 10.0000 mg | Freq: Every day | RECTAL | Status: DC | PRN

## 2018-02-15 MED ORDER — METHOCARBAMOL 1000 MG/10ML IJ SOLN
500.0000 mg | Freq: Four times a day (QID) | INTRAVENOUS | Status: DC | PRN
  Filled 2018-02-15: qty 5

## 2018-02-15 MED ORDER — CHLORHEXIDINE GLUCONATE 4 % EX LIQD: 60.0000 mL | Freq: Once | CUTANEOUS | Status: DC

## 2018-02-15 MED ORDER — PROPOFOL 10 MG/ML IV BOLUS
INTRAVENOUS | Status: AC
  Filled 2018-02-15: qty 20

## 2018-02-15 MED ORDER — HYDROMORPHONE HCL 1 MG/ML IJ SOLN
0.5000 mg | INTRAMUSCULAR | Status: DC | PRN
  Administered 2018-02-16 – 2018-02-22 (×3): 1 mg via INTRAVENOUS
  Filled 2018-02-15 (×3): qty 1

## 2018-02-15 MED ORDER — EPHEDRINE 5 MG/ML INJ
INTRAVENOUS | Status: AC
  Filled 2018-02-15: qty 10

## 2018-02-15 MED ORDER — KETOROLAC TROMETHAMINE 15 MG/ML IJ SOLN
INTRAMUSCULAR | Status: AC
  Filled 2018-02-15: qty 1

## 2018-02-15 MED ORDER — ROCURONIUM BROMIDE 10 MG/ML (PF) SYRINGE
PREFILLED_SYRINGE | INTRAVENOUS | Status: AC
  Filled 2018-02-15: qty 5

## 2018-02-15 MED ORDER — ROCURONIUM BROMIDE 100 MG/10ML IV SOLN
INTRAVENOUS | Status: DC | PRN
  Administered 2018-02-15: 20 mg via INTRAVENOUS
  Administered 2018-02-15: 40 mg via INTRAVENOUS

## 2018-02-15 MED ORDER — MIDAZOLAM HCL 2 MG/2ML IJ SOLN
INTRAMUSCULAR | Status: AC
Start: 2018-02-15 — End: 2018-02-15
  Filled 2018-02-15: qty 2

## 2018-02-15 MED ORDER — SUCCINYLCHOLINE CHLORIDE 200 MG/10ML IV SOSY
PREFILLED_SYRINGE | INTRAVENOUS | Status: AC
  Filled 2018-02-15: qty 10

## 2018-02-15 MED ORDER — PHENYLEPHRINE HCL 10 MG/ML IJ SOLN
INTRAMUSCULAR | Status: DC | PRN
  Administered 2018-02-15 (×2): 80 ug via INTRAVENOUS

## 2018-02-15 MED ORDER — PAROXETINE HCL 20 MG PO TABS
20.0000 mg | ORAL_TABLET | Freq: Every day | ORAL | Status: DC
  Administered 2018-02-15 – 2018-02-22 (×8): 20 mg via ORAL
  Filled 2018-02-15 (×8): qty 1

## 2018-02-15 MED ORDER — LIDOCAINE HCL (CARDIAC) 20 MG/ML IV SOLN
INTRAVENOUS | Status: AC
  Filled 2018-02-15: qty 5

## 2018-02-15 MED ORDER — MEPERIDINE HCL 50 MG/ML IJ SOLN: 6.2500 mg | INTRAMUSCULAR | Status: DC | PRN

## 2018-02-15 MED ORDER — MIDAZOLAM HCL 5 MG/5ML IJ SOLN
INTRAMUSCULAR | Status: DC | PRN
  Administered 2018-02-15 (×2): 1 mg via INTRAVENOUS

## 2018-02-15 MED ORDER — HYDROXYZINE HCL 50 MG PO TABS
200.0000 mg | ORAL_TABLET | Freq: Every day | ORAL | Status: DC
  Administered 2018-02-15 – 2018-02-21 (×7): 200 mg via ORAL
  Filled 2018-02-15: qty 4
  Filled 2018-02-15 (×3): qty 8
  Filled 2018-02-15: qty 4
  Filled 2018-02-15: qty 8
  Filled 2018-02-15: qty 4
  Filled 2018-02-15 (×2): qty 8

## 2018-02-15 MED ORDER — ONDANSETRON HCL 4 MG/2ML IJ SOLN: 4.0000 mg | Freq: Four times a day (QID) | INTRAMUSCULAR | Status: DC | PRN

## 2018-02-15 MED ORDER — PHENOL 1.4 % MT LIQD: 1.0000 | OROMUCOSAL | Status: DC | PRN

## 2018-02-15 MED ORDER — POLYETHYLENE GLYCOL 3350 17 G PO PACK: 17.0000 g | PACK | Freq: Every day | ORAL | Status: DC | PRN

## 2018-02-15 MED ORDER — ONDANSETRON HCL 4 MG/2ML IJ SOLN
INTRAMUSCULAR | Status: AC
  Filled 2018-02-15: qty 2

## 2018-02-15 MED ORDER — 0.9 % SODIUM CHLORIDE (POUR BTL) OPTIME
TOPICAL | Status: DC | PRN
  Administered 2018-02-15: 1000 mL

## 2018-02-15 MED ORDER — TRANEXAMIC ACID 1000 MG/10ML IV SOLN
2000.0000 mg | Freq: Once | INTRAVENOUS | Status: DC
  Filled 2018-02-15: qty 20

## 2018-02-15 MED ORDER — FENTANYL CITRATE (PF) 100 MCG/2ML IJ SOLN
INTRAMUSCULAR | Status: DC | PRN
  Administered 2018-02-15: 150 ug via INTRAVENOUS
  Administered 2018-02-15 (×2): 50 ug via INTRAVENOUS

## 2018-02-15 MED ORDER — ONDANSETRON HCL 4 MG PO TABS: 4.0000 mg | ORAL_TABLET | Freq: Four times a day (QID) | ORAL | Status: DC | PRN

## 2018-02-15 MED ORDER — HYDROMORPHONE HCL 1 MG/ML IJ SOLN
0.2500 mg | INTRAMUSCULAR | Status: DC | PRN
  Administered 2018-02-15 (×2): 0.5 mg via INTRAVENOUS

## 2018-02-15 MED ORDER — SODIUM CHLORIDE 0.9 % IR SOLN
Status: DC | PRN
  Administered 2018-02-15: 3000 mL

## 2018-02-15 MED ORDER — ROPIVACAINE HCL 5 MG/ML IJ SOLN: INTRAMUSCULAR | Status: DC | PRN

## 2018-02-15 MED ORDER — PHENYLEPHRINE 40 MCG/ML (10ML) SYRINGE FOR IV PUSH (FOR BLOOD PRESSURE SUPPORT)
PREFILLED_SYRINGE | INTRAVENOUS | Status: AC
  Filled 2018-02-15: qty 10

## 2018-02-15 MED ORDER — ONDANSETRON HCL 4 MG/2ML IJ SOLN
INTRAMUSCULAR | Status: DC | PRN
  Administered 2018-02-15: 4 mg via INTRAVENOUS

## 2018-02-15 MED ORDER — FENTANYL CITRATE (PF) 250 MCG/5ML IJ SOLN
INTRAMUSCULAR | Status: AC
  Filled 2018-02-15: qty 5

## 2018-02-15 MED ORDER — SUGAMMADEX SODIUM 200 MG/2ML IV SOLN
INTRAVENOUS | Status: AC
  Filled 2018-02-15: qty 2

## 2018-02-15 MED ORDER — SODIUM CHLORIDE 0.9 % IV SOLN
INTRAVENOUS | Status: AC | PRN
  Administered 2018-02-15: 2000 mg via TOPICAL

## 2018-02-15 MED ORDER — PROPOFOL 10 MG/ML IV BOLUS
INTRAVENOUS | Status: DC | PRN
  Administered 2018-02-15: 100 mg via INTRAVENOUS

## 2018-02-15 MED ORDER — MAGNESIUM CITRATE PO SOLN: 1.0000 | Freq: Once | ORAL | Status: DC | PRN

## 2018-02-15 MED ORDER — SORAFENIB TOSYLATE 200 MG PO TABS: 200.0000 mg | ORAL_TABLET | Freq: Two times a day (BID) | ORAL | Status: DC

## 2018-02-15 MED ORDER — LIDOCAINE 2% (20 MG/ML) 5 ML SYRINGE
INTRAMUSCULAR | Status: DC | PRN
  Administered 2018-02-15: 100 mg via INTRAVENOUS

## 2018-02-15 MED ORDER — METHOCARBAMOL 500 MG PO TABS
ORAL_TABLET | ORAL | Status: AC
  Filled 2018-02-15: qty 1

## 2018-02-15 MED ORDER — DOCUSATE SODIUM 100 MG PO CAPS
100.0000 mg | ORAL_CAPSULE | Freq: Two times a day (BID) | ORAL | Status: DC
  Administered 2018-02-15 – 2018-02-22 (×15): 100 mg via ORAL
  Filled 2018-02-15 (×15): qty 1

## 2018-02-15 MED ORDER — SODIUM CHLORIDE 0.9 % IV SOLN
INTRAVENOUS | Status: DC
  Administered 2018-02-15: 17:00:00 via INTRAVENOUS

## 2018-02-15 MED ORDER — KETOROLAC TROMETHAMINE 15 MG/ML IJ SOLN
15.0000 mg | Freq: Once | INTRAMUSCULAR | Status: AC
  Administered 2018-02-15: 15 mg via INTRAVENOUS

## 2018-02-15 MED ORDER — LISINOPRIL 10 MG PO TABS
10.0000 mg | ORAL_TABLET | Freq: Every day | ORAL | Status: DC
  Administered 2018-02-15 – 2018-02-21 (×6): 10 mg via ORAL
  Filled 2018-02-15 (×7): qty 1

## 2018-02-15 MED ORDER — METHOCARBAMOL 500 MG PO TABS
500.0000 mg | ORAL_TABLET | Freq: Four times a day (QID) | ORAL | Status: DC | PRN
  Administered 2018-02-15 – 2018-02-19 (×6): 500 mg via ORAL
  Filled 2018-02-15 (×5): qty 1

## 2018-02-15 MED ORDER — CEFAZOLIN SODIUM-DEXTROSE 2-4 GM/100ML-% IV SOLN
2.0000 g | INTRAVENOUS | Status: AC
  Administered 2018-02-15: 2 g via INTRAVENOUS

## 2018-02-15 MED ORDER — PROPOFOL 1000 MG/100ML IV EMUL
INTRAVENOUS | Status: AC
  Filled 2018-02-15: qty 100

## 2018-02-15 MED ORDER — LACTATED RINGERS IV SOLN
INTRAVENOUS | Status: DC | PRN
  Administered 2018-02-15 (×2): via INTRAVENOUS

## 2018-02-15 MED ORDER — PANTOPRAZOLE SODIUM 40 MG PO TBEC
40.0000 mg | DELAYED_RELEASE_TABLET | Freq: Every day | ORAL | Status: DC
  Administered 2018-02-16 – 2018-02-22 (×7): 40 mg via ORAL
  Filled 2018-02-15 (×7): qty 1

## 2018-02-15 MED ORDER — SUGAMMADEX SODIUM 200 MG/2ML IV SOLN
INTRAVENOUS | Status: DC | PRN
  Administered 2018-02-15: 200 mg via INTRAVENOUS

## 2018-02-15 MED ORDER — ACETAMINOPHEN 325 MG PO TABS
325.0000 mg | ORAL_TABLET | Freq: Four times a day (QID) | ORAL | Status: DC | PRN
  Administered 2018-02-18 – 2018-02-22 (×2): 650 mg via ORAL
  Filled 2018-02-15 (×2): qty 2

## 2018-02-15 MED ORDER — CEFAZOLIN SODIUM-DEXTROSE 2-4 GM/100ML-% IV SOLN
INTRAVENOUS | Status: AC
  Filled 2018-02-15: qty 100

## 2018-02-15 MED ORDER — CHOLESTYRAMINE 4 G PO PACK
4.0000 g | PACK | Freq: Every morning | ORAL | Status: DC
  Administered 2018-02-17 – 2018-02-22 (×6): 4 g via ORAL
  Filled 2018-02-15 (×7): qty 1

## 2018-02-15 MED ORDER — OXYCODONE HCL 5 MG PO TABS
10.0000 mg | ORAL_TABLET | ORAL | Status: DC | PRN
  Administered 2018-02-17 – 2018-02-18 (×4): 15 mg via ORAL
  Filled 2018-02-15 (×3): qty 3
  Filled 2018-02-15: qty 2
  Filled 2018-02-15: qty 3

## 2018-02-15 MED ORDER — CLONAZEPAM 0.5 MG PO TABS
0.5000 mg | ORAL_TABLET | Freq: Two times a day (BID) | ORAL | Status: DC
  Administered 2018-02-15 – 2018-02-22 (×14): 0.5 mg via ORAL
  Filled 2018-02-15 (×14): qty 1

## 2018-02-15 MED ORDER — CEFAZOLIN SODIUM-DEXTROSE 1-4 GM/50ML-% IV SOLN
1.0000 g | Freq: Four times a day (QID) | INTRAVENOUS | Status: AC
  Administered 2018-02-15 (×2): 1 g via INTRAVENOUS
  Filled 2018-02-15 (×2): qty 50

## 2018-02-15 MED ORDER — DEXAMETHASONE SODIUM PHOSPHATE 10 MG/ML IJ SOLN
INTRAMUSCULAR | Status: AC
  Filled 2018-02-15: qty 1

## 2018-02-15 MED ORDER — TRANEXAMIC ACID 1000 MG/10ML IV SOLN
1000.0000 mg | INTRAVENOUS | Status: AC
  Administered 2018-02-15: 1000 mg via INTRAVENOUS
  Filled 2018-02-15: qty 1100

## 2018-02-15 MED ORDER — HYDROMORPHONE HCL 1 MG/ML IJ SOLN
INTRAMUSCULAR | Status: AC
  Filled 2018-02-15: qty 1

## 2018-02-15 MED ORDER — CLONIDINE HCL 0.2 MG PO TABS
0.2000 mg | ORAL_TABLET | Freq: Two times a day (BID) | ORAL | Status: DC
  Administered 2018-02-15 – 2018-02-21 (×12): 0.2 mg via ORAL
  Filled 2018-02-15 (×14): qty 1

## 2018-02-15 MED ORDER — DEXAMETHASONE SODIUM PHOSPHATE 4 MG/ML IJ SOLN
INTRAMUSCULAR | Status: DC | PRN
  Administered 2018-02-15: 8 mg via INTRAVENOUS

## 2018-02-15 MED ORDER — METOCLOPRAMIDE HCL 5 MG/ML IJ SOLN: 5.0000 mg | Freq: Three times a day (TID) | INTRAMUSCULAR | Status: DC | PRN

## 2018-02-15 MED ORDER — OXYCODONE HCL 5 MG PO TABS
ORAL_TABLET | ORAL | Status: AC
  Filled 2018-02-15: qty 1

## 2018-02-15 MED ORDER — ROPIVACAINE HCL 5 MG/ML IJ SOLN
INTRAMUSCULAR | Status: DC | PRN
  Administered 2018-02-15 (×6): 5 mL via PERINEURAL

## 2018-02-15 MED ORDER — MENTHOL 3 MG MT LOZG: 1.0000 | LOZENGE | OROMUCOSAL | Status: DC | PRN

## 2018-02-15 MED ORDER — METOPROLOL SUCCINATE ER 25 MG PO TB24
25.0000 mg | ORAL_TABLET | Freq: Every day | ORAL | Status: DC
  Administered 2018-02-16 – 2018-02-21 (×6): 25 mg via ORAL
  Filled 2018-02-15 (×7): qty 1

## 2018-02-15 MED ORDER — ALBUTEROL SULFATE (2.5 MG/3ML) 0.083% IN NEBU: 3.0000 mL | INHALATION_SOLUTION | Freq: Four times a day (QID) | RESPIRATORY_TRACT | Status: DC | PRN

## 2018-02-15 MED ORDER — PROMETHAZINE HCL 25 MG/ML IJ SOLN: 6.2500 mg | INTRAMUSCULAR | Status: DC | PRN

## 2018-02-15 MED ORDER — METOCLOPRAMIDE HCL 5 MG PO TABS: 5.0000 mg | ORAL_TABLET | Freq: Three times a day (TID) | ORAL | Status: DC | PRN

## 2018-02-15 MED ORDER — OXYCODONE HCL 5 MG PO TABS
5.0000 mg | ORAL_TABLET | ORAL | Status: DC | PRN
  Administered 2018-02-15: 10 mg via ORAL
  Administered 2018-02-15 (×2): 5 mg via ORAL
  Administered 2018-02-16 (×2): 10 mg via ORAL
  Administered 2018-02-18 – 2018-02-19 (×2): 5 mg via ORAL
  Administered 2018-02-20 – 2018-02-22 (×5): 10 mg via ORAL
  Filled 2018-02-15 (×2): qty 2
  Filled 2018-02-15: qty 1
  Filled 2018-02-15 (×2): qty 2
  Filled 2018-02-15: qty 1
  Filled 2018-02-15: qty 2
  Filled 2018-02-15: qty 1
  Filled 2018-02-15 (×2): qty 2

## 2018-02-15 SURGICAL SUPPLY — 49 items
BLADE SAGITTAL 25.0X1.19X90 (BLADE) ×2 IMPLANT
BLADE SAGITTAL 25.0X1.19X90MM (BLADE) ×1
BLADE SAW SGTL 13X75X1.27 (BLADE) ×3 IMPLANT
BLADE SURG 21 STRL SS (BLADE) ×9 IMPLANT
BNDG COHESIVE 6X5 TAN STRL LF (GAUZE/BANDAGES/DRESSINGS) ×3 IMPLANT
BNDG GAUZE ELAST 4 BULKY (GAUZE/BANDAGES/DRESSINGS) ×3 IMPLANT
BOWL SMART MIX CTS (DISPOSABLE) ×3 IMPLANT
CAP KNEE TOTAL 3 SIGMA ×3 IMPLANT
CEMENT BONE R 1X40 (Cement) ×6 IMPLANT
COVER SURGICAL LIGHT HANDLE (MISCELLANEOUS) ×3 IMPLANT
CUFF TOURNIQUET SINGLE 34IN LL (TOURNIQUET CUFF) ×3 IMPLANT
CUFF TOURNIQUET SINGLE 44IN (TOURNIQUET CUFF) IMPLANT
DRAPE EXTREMITY T 121X128X90 (DRAPE) ×3 IMPLANT
DRAPE HALF SHEET 40X57 (DRAPES) ×6 IMPLANT
DRAPE U-SHAPE 47X51 STRL (DRAPES) ×3 IMPLANT
DRSG ADAPTIC 3X8 NADH LF (GAUZE/BANDAGES/DRESSINGS) ×3 IMPLANT
DRSG PAD ABDOMINAL 8X10 ST (GAUZE/BANDAGES/DRESSINGS) ×3 IMPLANT
DURAPREP 26ML APPLICATOR (WOUND CARE) ×3 IMPLANT
ELECT REM PT RETURN 9FT ADLT (ELECTROSURGICAL) ×3
ELECTRODE REM PT RTRN 9FT ADLT (ELECTROSURGICAL) ×1 IMPLANT
FACESHIELD WRAPAROUND (MASK) ×3 IMPLANT
FLUID NSS /IRRIG 3000 ML XXX (IV SOLUTION) ×3 IMPLANT
GAUZE SPONGE 4X4 12PLY STRL (GAUZE/BANDAGES/DRESSINGS) ×3 IMPLANT
GAUZE SPONGE 4X4 12PLY STRL LF (GAUZE/BANDAGES/DRESSINGS) ×3 IMPLANT
GLOVE BIOGEL PI IND STRL 9 (GLOVE) ×1 IMPLANT
GLOVE BIOGEL PI INDICATOR 9 (GLOVE) ×2
GLOVE SURG ORTHO 9.0 STRL STRW (GLOVE) ×3 IMPLANT
GOWN STRL REUS W/ TWL XL LVL3 (GOWN DISPOSABLE) ×2 IMPLANT
GOWN STRL REUS W/TWL XL LVL3 (GOWN DISPOSABLE) ×6
HANDPIECE INTERPULSE COAX TIP (DISPOSABLE) ×2
KIT BASIN OR (CUSTOM PROCEDURE TRAY) ×3 IMPLANT
KIT TURNOVER KIT B (KITS) ×3 IMPLANT
MANIFOLD NEPTUNE II (INSTRUMENTS) ×3 IMPLANT
NS IRRIG 1000ML POUR BTL (IV SOLUTION) ×3 IMPLANT
PACK TOTAL JOINT (CUSTOM PROCEDURE TRAY) ×3 IMPLANT
PAD ABD 8X10 STRL (GAUZE/BANDAGES/DRESSINGS) ×3 IMPLANT
PAD ARMBOARD 7.5X6 YLW CONV (MISCELLANEOUS) ×3 IMPLANT
PENCIL BUTTON HOLSTER BLD 10FT (ELECTRODE) ×3 IMPLANT
SET HNDPC FAN SPRY TIP SCT (DISPOSABLE) ×1 IMPLANT
STAPLER VISISTAT 35W (STAPLE) ×3 IMPLANT
SUCTION FRAZIER HANDLE 10FR (MISCELLANEOUS)
SUCTION TUBE FRAZIER 10FR DISP (MISCELLANEOUS) IMPLANT
SUT VIC AB 0 CT1 27 (SUTURE) ×3
SUT VIC AB 0 CT1 27XBRD ANBCTR (SUTURE) ×1 IMPLANT
SUT VIC AB 1 CTX 36 (SUTURE)
SUT VIC AB 1 CTX36XBRD ANBCTR (SUTURE) IMPLANT
TOWEL OR 17X24 6PK STRL BLUE (TOWEL DISPOSABLE) ×3 IMPLANT
TOWEL OR 17X26 10 PK STRL BLUE (TOWEL DISPOSABLE) ×3 IMPLANT
WRAP KNEE MAXI GEL POST OP (GAUZE/BANDAGES/DRESSINGS) ×3 IMPLANT

## 2018-02-15 NOTE — Evaluation (Signed)
Physical Therapy Evaluation Patient Details Name: Kyle Mejia MRN: [id number] DOB: [birth year] Today's Date: 02/15/2018   History of Present Illness  Pt is a 62 y/o male s/p elective L TKA. PMH includes TBI, HTN, alcohol abuse, tobacco abuse, hepatitis C, hepatic cancer, osteopenia.   Clinical Impression  Pt is s/p surgery above with deficits below. Pt with L knee buckling and unsteadiness during mobility, therefore, distance limited to chair. Required min to mod A for mobility this session. Reviewed supine HEP and knee precautions with pt, however, will need further review. Pt unsure of d/c plan at this time; reports he lives at [facility]. Will continue to follow acutely to maximize functional mobility independence and safety.     Follow Up Recommendations Follow surgeon's recommendation for DC plan and follow-up therapies;Supervision for mobility/OOB    Equipment Recommendations  None recommended by PT    Recommendations for Other Services OT consult     Precautions / Restrictions Precautions Precautions: Knee;Fall Precaution Booklet Issued: Yes (comment) Precaution Comments: Reviwed knee precautions with pt.  Restrictions Weight Bearing Restrictions: Yes LLE Weight Bearing: Weight bearing as tolerated Other Position/Activity Restrictions: Called Dr. Sharol Given to clarify weightbearing precautions and per MD LLE to be WBAT.       Mobility  Bed Mobility Overal bed mobility: Needs Assistance Bed Mobility: Supine to Sit     Supine to sit: Mod assist     General bed mobility comments: Mod A for trunk elevation and LLE assist. Also required assist to scoot pt's hips to EOB using bed pad.   Transfers Overall transfer level: Needs assistance Equipment used: Rolling walker (2 wheeled) Transfers: Sit to/from [facility] Sit to Stand: Min assist Stand pivot transfers: Min assist       General transfer comment: Min A for lift assist and steadying to stand. Verbal  cues for safe hand placement. Noted L knee buckling during transfer to chair, and pt unsteady requiring min A for steadying. Verbal cues for sequencing using RW.   Ambulation/Gait             General Gait Details: Deferred   Stairs            Wheelchair Mobility    Modified Rankin (Stroke Patients Only)       Balance Overall balance assessment: Needs assistance Sitting-balance support: No upper extremity supported;Feet supported Sitting balance-Leahy Scale: Good     Standing balance support: Bilateral upper extremity supported;No upper extremity supported Standing balance-Leahy Scale: Poor Standing balance comment: Reliant on BUE support.                              Pertinent Vitals/Pain Pain Assessment: Faces Faces Pain Scale: Hurts even more Pain Location: L knee  Pain Descriptors / Indicators: Aching;Operative site guarding Pain Intervention(s): Limited activity within patient's tolerance;Monitored during session;Repositioned    Home Living Family/patient expects to be discharged to:: Assisted living Living Arrangements: Other (Comment)([facility])             Home Equipment: Wheelchair - manual      Prior Function Level of Independence: Needs assistance   Gait / Transfers Assistance Needed: Reports he was able to transfer to [facility] independently. Sometimes used RW.   ADL's / Homemaking Assistance Needed: Reports he needed assist with turning shower on        Hand Dominance        Extremity/Trunk Assessment  Lower Extremity Assessment Lower Extremity Assessment: LLE deficits/detail LLE Deficits / Details: Deficits consistent with post op pain and weakness. Able to perform ther ex below. Reports decreased sensation.     Cervical / Trunk Assessment Cervical / Trunk Assessment: Kyphotic  Communication   Communication: No difficulties  Cognition Arousal/Alertness: Awake/alert Behavior During Therapy: WFL for tasks  assessed/performed Overall Cognitive Status: No family/caregiver present to determine baseline cognitive functioning                                        General Comments      Exercises Total Joint Exercises Ankle Circles/Pumps: AROM;Both;20 reps Quad Sets: AROM;Left;10 reps(verbal cues ) Heel Slides: AAROM;Both;10 reps(partial range )   Assessment/Plan    PT Assessment Patient needs continued PT services  PT Problem List Decreased strength;Decreased range of motion;Decreased balance;Decreased mobility;Decreased coordination;Decreased cognition;Decreased knowledge of use of DME;Decreased safety awareness;Decreased knowledge of precautions;Pain       PT Treatment Interventions Gait training;DME instruction;Functional mobility training;Therapeutic activities;Therapeutic exercise;Balance training;Neuromuscular re-education;Patient/family education    PT Goals (Current goals can be found in the Care Plan section)  Acute Rehab PT Goals Patient Stated Goal: to be able to walk  PT Goal Formulation: With patient Time For Goal Achievement: 03/01/18 Potential to Achieve Goals: Good    Frequency 7X/week   Barriers to discharge        Co-evaluation               AM-PAC PT "6 Clicks" Daily Activity  Outcome Measure Difficulty turning over in bed (including adjusting bedclothes, sheets and blankets)?: A Little Difficulty moving from lying on back to sitting on the side of the bed? : Unable Difficulty sitting down on and standing up from a chair with arms (e.g., wheelchair, bedside commode, etc,.)?: Unable Help needed moving to and from a bed to chair (including a wheelchair)?: A Little Help needed walking in hospital room?: A Lot Help needed climbing 3-5 steps with a railing? : A Lot 6 Click Score: 12    End of Session Equipment Utilized During Treatment: Gait belt Activity Tolerance: Patient tolerated treatment well Patient left: in chair;with call  bell/phone within reach;with nursing/sitter in room;with chair alarm set Nurse Communication: Mobility status PT Visit Diagnosis: Unsteadiness on feet (R26.81);Other abnormalities of gait and mobility (R26.89);History of falling (Z91.81);Pain Pain - Right/Left: Left Pain - part of body: Knee    Time: 8032-1224 PT Time Calculation (min) (ACUTE ONLY): 40 min   Charges:   PT Evaluation $PT Eval Low Complexity: 1 Low PT Treatments $Therapeutic Activity: 23-37 mins   PT G Codes:        Leighton Ruff, PT, [name]  Acute Rehabilitation Services  Pager: [phone]   Rudean Hitt 02/15/2018, 5:07 PM

## 2018-02-15 NOTE — Progress Notes (Addendum)
No one with Kyle Mejia.  He did come from [facility].  [phone] His clothes and wheelchair will go to PACU. Profend applied to each nostril, two on each side x 20 secs.

## 2018-02-15 NOTE — Anesthesia Postprocedure Evaluation (Signed)
Anesthesia Post Note  Patient: Kyle Mejia  Procedure(s) Performed: LEFT TOTAL KNEE ARTHROPLASTY (Left )     Patient location during evaluation: PACU Anesthesia Type: General Level of consciousness: awake Pain management: pain level controlled Vital Signs Assessment: post-procedure vital signs reviewed and stable Respiratory status: spontaneous breathing Cardiovascular status: stable Postop Assessment: no apparent nausea or vomiting Anesthetic complications: no    Last Vitals:  Vitals:   02/15/18 0551 02/15/18 0913  BP: (!) 167/75   Pulse: (!) 58   Resp: 18   Temp: 36.6 C 36.4 C  SpO2: 99%     Last Pain:  Vitals:   02/15/18 0913  TempSrc:   PainSc: Asleep   Pain Goal: Patients Stated Pain Goal: 3 (02/15/18 6122)               Sukhdeep Wieting JR,JOHN Mateo Flow

## 2018-02-15 NOTE — Anesthesia Procedure Notes (Signed)
Anesthesia [facility] Block: Adductor canal block   Pre-Anesthetic Checklist: ,, timeout performed, Correct Patient, Correct Site, Correct Laterality, Correct Procedure, Correct Position, site marked, Risks and benefits discussed,  Surgical consent,  Pre-op evaluation,  At surgeon's request and post-op pain management  Laterality: Left and Lower  Prep: chloraprep       Needles:  Injection technique: Single-shot  Needle Type: Echogenic Stimulator Needle     Needle Length: 9cm  Needle Gauge: 21   Needle insertion depth: 3 cm   Additional Needles:   Procedures:,,,, ultrasound used (permanent image in chart),,,,  Narrative:  Start time: 02/15/2018 7:10 AM End time: 02/15/2018 7:19 AM Injection made incrementally with aspirations every 5 mL.  Performed by: Personally  Anesthesiologist: Lyn Hollingshead, MD
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
## 2018-02-15 NOTE — Op Note (Signed)
DATE OF SURGERY:  02/15/2018  TIME: 9:09 AM  PATIENT NAME:  Kyle Mejia    AGE: 62 y.o.    PRE-OPERATIVE DIAGNOSIS:  TRAUMATIC OA LEFT KNEE  POST-OPERATIVE DIAGNOSIS:  TRAUMATIC OA LEFT KNEE  PROCEDURE:  Procedure(s): LEFT TOTAL KNEE ARTHROPLASTY  SURGEON: Meridee Score  ASSISTANT: April Green.  OPERATIVE IMPLANTS: Depuy , Posterior Stabilized.  Femur size 7, Tibia size 7, Patella size 36 3-peg oval button, with a 14 there is no inflammation noted in the subcutaneous tissue is seen.  Mm polyethylene insert.  @ENCIMAGES @      PREOPERATIVE INDICATIONS:   Kyle Mejia is a 62 y.o. year old male with end stage degenerative arthritis of the knee who failed conservative treatment and elected for Total Knee Arthroplasty.   The risks, benefits, and alternatives were discussed at length including but not limited to the risks of infection, bleeding, nerve injury, stiffness, blood clots, the need for revision surgery, cardiopulmonary complications, among others, and they were willing to proceed.  OPERATIVE DESCRIPTION:  The patient was brought to the operative room and placed in a supine position.  General anesthesia was administered.  IV antibiotics were given.  The lower extremity was prepped and draped in the usual sterile fashion.  Kyle Mejia was used to cover all exposed skin. Time out was performed.    Anterior quadriceps tendon splitting approach was performed.  The patella was everted and osteophytes were removed.  The anterior horn of the medial and lateral meniscus was removed.   The distal femur was opened with the drill and the intramedullary distal femoral cutting jig was utilized, set at 5 degrees valgus resecting 9 mm off the distal femur.  Care was taken to protect the collateral ligaments.  Then the extramedullary tibial cutting jig was utilized set for 3 degree posterior slope.  Care was taken during the cut to protect the medial and collateral ligaments.  The  proximal tibia was removed along with the posterior horns of the menisci.  The PCL was sacrificed.   The patella was everted The extensor gap was measured and was approximately 14 mm.    The distal femoral sizing jig was applied, taking care to avoid notching.  Then the 4-in-1 cutting jig was applied and the anterior and posterior femur was cut, along with the chamfer cuts.  All posterior osteophytes were removed.  The flexion gap was then measured and was symmetric with the extension gap.  The distal femoral preparation using the appropriate jig to prepare the box.  The patella was then measured, and cut with the saw.    The proximal tibia sized and prepared accordingly with the reamer and the punch, and then all components were trialed with the poly insert.  The knee was found to have stable balance and full motion.  The knee was irrigated with normal saline and the knee was soaked with TXA.  The above named components were then cemented into place and all excess cement was removed.  The final polyethylene component was in place during cementation.  The knee was kept in extension until the cement hardened.  The knee was then taken through a range of motion and the patella tracked well and the knee irrigated copiously and the parapatellar and subcutaneous tissue closed with vicryl, and skin closed with staples..  A sterile dressing was applied and patient  was taken to the PACU in stable  condition.  There were no complications.  Total tourniquet time approximately 45 minutes.

## 2018-02-15 NOTE — Anesthesia Procedure Notes (Signed)
Procedure Name: Intubation Date/Time: 02/15/2018 7:35 AM Performed by: Orlie Dakin, CRNA Pre-anesthesia Checklist: Patient identified, Emergency Drugs available, Suction available, Patient being monitored and Timeout performed Patient Re-evaluated:Patient Re-evaluated prior to induction Oxygen Delivery Method: Circle system utilized Preoxygenation: Pre-oxygenation with 100% oxygen Induction Type: IV induction Ventilation: Mask ventilation without difficulty Laryngoscope Size: Miller and 3 Grade View: Grade I Tube type: Oral Tube size: 7.5 mm Number of attempts: 1 Airway Equipment and Method: Stylet Placement Confirmation: ETT inserted through vocal cords under direct vision,  positive ETCO2 and breath sounds checked- equal and bilateral Secured at: 22 cm Tube secured with: Tape Dental Injury: Teeth and Oropharynx as per pre-operative assessment  Comments: Noted very poor dentition pre-op, many top front and bottom teeth missing, some blackened but none loose per patient report.

## 2018-02-15 NOTE — Transfer of Care (Signed)
Immediate Anesthesia Transfer of Care Note  Patient: Kyle Mejia  Procedure(s) Performed: LEFT TOTAL KNEE ARTHROPLASTY (Left )  Patient Location: PACU  Anesthesia Type:General and Regional  Level of Consciousness: awake and patient cooperative  Airway & Oxygen Therapy: Patient Spontanous Breathing and Patient connected to face mask oxygen  Post-op Assessment: Report given to RN and Post -op Vital signs reviewed and stable  Post vital signs: Reviewed and stable  Last Vitals:  Vitals Value Taken Time  BP 158/97 02/15/2018  9:13 AM  Temp    Pulse 83 02/15/2018  9:16 AM  Resp 17 02/15/2018  9:16 AM  SpO2 99 % 02/15/2018  9:16 AM  Vitals shown include unvalidated device data.  Last Pain:  Vitals:   02/15/18 0551  TempSrc: Oral      Patients Stated Pain Goal: 3 (73/71/06 2694)  Complications: No apparent anesthesia complications

## 2018-02-15 NOTE — Addendum Note (Signed)
Addendum  created 02/15/18 1015 by Orlie Dakin, CRNA   Intraprocedure Flowsheets edited

## 2018-02-16 ENCOUNTER — Encounter (HOSPITAL_COMMUNITY): Payer: Self-pay | Admitting: Orthopedic Surgery

## 2018-02-16 NOTE — Social Work (Addendum)
Pt will need PASSR for SNF placement. CSW left 30 day note on chart for doctor to sign and Long form FL2 that needs to be signed for medicaid patients.  CSW will follow for disposition.  Elissa Hefty, LCSW Clinical Social Worker [phone]

## 2018-02-16 NOTE — Plan of Care (Signed)

## 2018-02-16 NOTE — Progress Notes (Signed)
Patient ID: Kyle Mejia, male   DOB: [birth year], 62 y.o.   MRN: [id number] Postoperative day 1 left total knee arthroplasty.  Patient denies any symptoms.  Discussed the importance of working on knee extension.  Patient will need discharge to skilled nursing.

## 2018-02-16 NOTE — Plan of Care (Signed)
  Problem: Education: Goal: Knowledge of General Education information will improve 02/16/2018 1131 by Rance Muir, RN Outcome: Progressing 02/16/2018 1057 by Rance Muir, RN Outcome: Progressing   Problem: Health Behavior/Discharge Planning: Goal: Ability to manage health-related needs will improve 02/16/2018 1131 by Rance Muir, RN Outcome: Progressing 02/16/2018 1057 by Rance Muir, RN Outcome: Progressing   Problem: Clinical Measurements: Goal: Ability to maintain clinical measurements within normal limits will improve 02/16/2018 1131 by Rance Muir, RN Outcome: Progressing 02/16/2018 1057 by Rance Muir, RN Outcome: Progressing Goal: Will remain free from infection 02/16/2018 1131 by Rance Muir, RN Outcome: Progressing 02/16/2018 1057 by Rance Muir, RN Outcome: Progressing Goal: Diagnostic test results will improve 02/16/2018 1131 by Rance Muir, RN Outcome: Progressing 02/16/2018 1057 by Rance Muir, RN Outcome: Progressing Goal: Respiratory complications will improve 02/16/2018 1131 by Rance Muir, RN Outcome: Progressing 02/16/2018 1057 by Rance Muir, RN Outcome: Progressing Goal: Cardiovascular complication will be avoided 02/16/2018 1131 by Rance Muir, RN Outcome: Progressing 02/16/2018 1057 by Rance Muir, RN Outcome: Progressing   Problem: Activity: Goal: Risk for activity intolerance will decrease 02/16/2018 1131 by Rance Muir, RN Outcome: Progressing 02/16/2018 1057 by Rance Muir, RN Outcome: Progressing   Problem: Nutrition: Goal: Adequate nutrition will be maintained 02/16/2018 1131 by Rance Muir, RN Outcome: Progressing 02/16/2018 1057 by Rance Muir, RN Outcome: Progressing   Problem: Coping: Goal: Level of anxiety will decrease 02/16/2018 1131 by Rance Muir, RN Outcome: Progressing 02/16/2018 1057 by Rance Muir, RN Outcome: Progressing   Problem: Elimination: Goal: Will not experience complications related to bowel motility 02/16/2018 1131 by Rance Muir, RN Outcome:  Progressing 02/16/2018 1057 by Rance Muir, RN Outcome: Progressing Goal: Will not experience complications related to urinary retention 02/16/2018 1131 by Rance Muir, RN Outcome: Progressing 02/16/2018 1057 by Rance Muir, RN Outcome: Progressing   Problem: Pain Managment: Goal: General experience of comfort will improve 02/16/2018 1131 by Rance Muir, RN Outcome: Progressing 02/16/2018 1057 by Rance Muir, RN Outcome: Progressing   Problem: Safety: Goal: Ability to remain free from injury will improve 02/16/2018 1131 by Rance Muir, RN Outcome: Progressing 02/16/2018 1057 by Rance Muir, RN Outcome: Progressing   Problem: Skin Integrity: Goal: Risk for impaired skin integrity will decrease 02/16/2018 1131 by Rance Muir, RN Outcome: Progressing 02/16/2018 1057 by Rance Muir, RN Outcome: Progressing

## 2018-02-16 NOTE — Plan of Care (Signed)
  Problem: Education: Goal: Knowledge of General Education information will improve Outcome: Progressing Note:  POC reviewed with pt.   

## 2018-02-16 NOTE — Progress Notes (Signed)
Nexavar unavailable at [facility] and pt.'s girlfriend will try and bring med in today.

## 2018-02-16 NOTE — Evaluation (Signed)
Occupational Therapy Evaluation Patient Details Name: Kyle Mejia MRN: [id number] DOB: 20-Jan-1956 Today's Date: 02/16/2018    History of Present Illness Pt is a 62 y/o male s/p elective L TKA. PMH includes TBI, HTN, alcohol abuse, tobacco abuse, hepatitis C, hepatic cancer, osteopenia.    Clinical Impression   Pt admitted with the above diagnoses and presents with below problem list. Pt will benefit from continued acute OT to address the below listed deficits and maximize independence with basic ADLs prior to d/c to next venue. PTA pt resided in assisted living facility. He reports he had assistance with medication management, standby assist for mobility, and struggled with LB ADLs. Pt is currently +2 (safety for chair follow) min -mod A for functional mobility, mod A for LB ADLs.     Follow Up Recommendations  SNF    Equipment Recommendations  Other (comment)(defer to next venue)    Recommendations for Other Services       Precautions / Restrictions Precautions Precautions: Knee;Fall Precaution Booklet Issued: Yes (comment) Precaution Comments: Reviwed knee precautions with pt Restrictions Weight Bearing Restrictions: Yes LLE Weight Bearing: Weight bearing as tolerated      Mobility Bed Mobility Overal bed mobility: Needs Assistance Bed Mobility: Supine to Sit     Supine to sit: Min guard     General bed mobility comments: min guard for safety  Transfers Overall transfer level: Needs assistance Equipment used: Rolling walker (2 wheeled) Transfers: Sit to/from Stand Sit to Stand: Min assist         General transfer comment: assistance needed for balance and to stabilize RW; cues for safe hand placement    Balance Overall balance assessment: Needs assistance;History of Falls Sitting-balance support: No upper extremity supported;Feet supported Sitting balance-Leahy Scale: Good     Standing balance support: Bilateral upper extremity supported;No upper  extremity supported Standing balance-Leahy Scale: Poor Standing balance comment: Reliant on BUE support.                            ADL either performed or assessed with clinical judgement   ADL Overall ADL's : Needs assistance/impaired Eating/Feeding: Set up;Sitting   Grooming: Set up;Sitting   Upper Body Bathing: Set up;Sitting   Lower Body Bathing: Moderate assistance;Sit to/from stand   Upper Body Dressing : Set up;Sitting   Lower Body Dressing: Moderate assistance;Sit to/from stand   Toilet Transfer: Minimal assistance;Ambulation;RW   Toileting- Clothing Manipulation and Hygiene: Moderate assistance;Sit to/from stand   Tub/ Shower Transfer: Walk-in shower;Minimal assistance;Ambulation;3 in 1;Rolling walker   Functional mobility during ADLs: Minimal assistance;Moderate assistance;+2 for safety/equipment;Rolling walker General ADL Comments: Pt completed household distance functional mobility. Chair follow for safety. Unsteady with turns which pt felt was due to his equilibrium.      Vision         Perception     Praxis      Pertinent Vitals/Pain Pain Assessment: Faces Faces Pain Scale: Hurts little more Pain Location: L knee  Pain Descriptors / Indicators: Sore Pain Intervention(s): Limited activity within patient's tolerance;Monitored during session;Premedicated before session;Repositioned     Hand Dominance Right   Extremity/Trunk Assessment Upper Extremity Assessment Upper Extremity Assessment: Generalized weakness   Lower Extremity Assessment Lower Extremity Assessment: Defer to PT evaluation       Communication Communication Communication: No difficulties   Cognition Arousal/Alertness: Awake/alert Behavior During Therapy: WFL for tasks assessed/performed Overall Cognitive Status: No family/caregiver present to determine baseline cognitive functioning(history of  TBI)                                     General  Comments          Shoulder Instructions      Home Living Family/patient expects to be discharged to:: Skilled nursing facility                             Home Equipment: Wheelchair - manual          Prior Functioning/Environment Level of Independence: Needs assistance  Gait / Transfers Assistance Needed: Reports he was able to transfer to [facility] independently. Sometimes used RW. Reports he would walk with walker and standby assist within his living space. Walker or w/c to access dining room depending on how he was feeling.  ADL's / Homemaking Assistance Needed: Reports he probably could have used assistance with LB ADLs but "I just sort of figured out a way to get it done."            OT Problem List: Impaired balance (sitting and/or standing);Decreased activity tolerance;Decreased knowledge of use of DME or AE;Decreased knowledge of precautions;Pain      OT Treatment/Interventions: Self-care/ADL training;DME and/or AE instruction;Therapeutic activities;Balance training;Patient/family education    OT Goals(Current goals can be found in the care plan section) Acute Rehab OT Goals Patient Stated Goal: to be able to walk  OT Goal Formulation: With patient Time For Goal Achievement: 03/02/18 Potential to Achieve Goals: Good ADL Goals Pt Will Perform Lower Body Bathing: with modified independence;sit to/from stand Pt Will Perform Lower Body Dressing: with modified independence;sit to/from stand Pt Will Transfer to Toilet: with supervision;ambulating Pt Will Perform Toileting - Clothing Manipulation and hygiene: with supervision;sit to/from stand Pt Will Perform Tub/Shower Transfer: with supervision;ambulating;3 in 1;rolling walker  OT Frequency: Min 2X/week   Barriers to D/C:            Co-evaluation PT/OT/SLP Co-Evaluation/Treatment: Yes Reason for Co-Treatment: For patient/therapist safety;To address functional/ADL transfers PT goals addressed during session:  Mobility/safety with mobility;Proper use of DME OT goals addressed during session: ADL's and self-care      AM-PAC PT "6 Clicks" Daily Activity     Outcome Measure Help from another person eating meals?: None Help from another person taking care of personal grooming?: A Little Help from another person toileting, which includes using toliet, bedpan, or urinal?: A Lot Help from another person bathing (including washing, rinsing, drying)?: A Lot Help from another person to put on and taking off regular upper body clothing?: None Help from another person to put on and taking off regular lower body clothing?: A Lot 6 Click Score: 17   End of Session Equipment Utilized During Treatment: Gait belt;Rolling walker  Activity Tolerance: Patient tolerated treatment well Patient left: in chair;with call bell/phone within reach;with chair alarm set  OT Visit Diagnosis: Unsteadiness on feet (R26.81);Other abnormalities of gait and mobility (R26.89);History of falling (Z91.81);Muscle weakness (generalized) (M62.81);Pain                Time: 6045-4098 OT Time Calculation (min): 25 min Charges:  OT General Charges $OT Visit: 1 Visit OT Evaluation $OT Eval Low Complexity: 1 Low G-Codes:       Hortencia Pilar 02/16/2018, 12:33 PM

## 2018-02-16 NOTE — Progress Notes (Signed)
Physical Therapy Treatment Patient Details Name: Kyle Mejia MRN: [id number] DOB: 1956/11/04 Today's Date: 02/16/2018    History of Present Illness Pt is a 62 y/o male s/p elective L TKA. PMH includes TBI, HTN, alcohol abuse, tobacco abuse, hepatitis C, hepatic cancer, osteopenia.     PT Comments    Patient is making progress toward mobility goals and very pleasant during session. Pt required assistance for OOB mobility due to balance deficits. Pt will continue to benefit from further skilled PT services both acute and post acute to maximize independence and safety with mobility.     Follow Up Recommendations  Follow surgeon's recommendation for DC plan and follow-up therapies;Supervision for mobility/OOB     Equipment Recommendations  None recommended by PT    Recommendations for Other Services OT consult     Precautions / Restrictions Precautions Precautions: Knee;Fall Precaution Comments: Reviwed knee precautions with pt Restrictions Weight Bearing Restrictions: Yes LLE Weight Bearing: Weight bearing as tolerated    Mobility  Bed Mobility Overal bed mobility: Needs Assistance Bed Mobility: Supine to Sit     Supine to sit: Min guard     General bed mobility comments: min guard for safety  Transfers Overall transfer level: Needs assistance Equipment used: Rolling walker (2 wheeled) Transfers: Sit to/from Stand Sit to Stand: Min assist         General transfer comment: assistance needed for balance and to stabilize RW; cues for safe hand placement  Ambulation/Gait Ambulation/Gait assistance: Min assist;Mod assist;+2 safety/equipment Ambulation Distance (Feet): 75 Feet Assistive device: Rolling walker (2 wheeled) Gait Pattern/deviations: Step-to pattern;Step-through pattern;Decreased stance time - left;Decreased step length - left;Decreased step length - right;Decreased dorsiflexion - left;Decreased dorsiflexion - right;Trunk flexed;Narrow base of  support   Gait velocity interpretation: Below normal speed for age/gender General Gait Details: cues for safe proximity to RW, L knee flexion during swing phase, increased bilat step lengths, and L heel strike; pt with LOB X2 requiring assist to recover when turning/directional changes   Stairs            Wheelchair Mobility    Modified Rankin (Stroke Patients Only)       Balance Overall balance assessment: Needs assistance Sitting-balance support: No upper extremity supported;Feet supported Sitting balance-Leahy Scale: Good     Standing balance support: Bilateral upper extremity supported;No upper extremity supported Standing balance-Leahy Scale: Poor Standing balance comment: Reliant on BUE support.                             Cognition Arousal/Alertness: Awake/alert Behavior During Therapy: WFL for tasks assessed/performed Overall Cognitive Status: No family/caregiver present to determine baseline cognitive functioning(history of TBI)                                        Exercises Total Joint Exercises Ankle Circles/Pumps: AROM;Both;20 reps Quad Sets: AROM;Left;10 reps(verbal and tactile cues ) Heel Slides: AAROM;10 reps;Left    General Comments        Pertinent Vitals/Pain Pain Assessment: Faces Faces Pain Scale: Hurts little more Pain Location: L knee  Pain Descriptors / Indicators: Sore Pain Intervention(s): Limited activity within patient's tolerance;Monitored during session;Premedicated before session;Repositioned    Home Living                      Prior Function  PT Goals (current goals can now be found in the care plan section) Acute Rehab PT Goals PT Goal Formulation: With patient Time For Goal Achievement: 03/01/18 Potential to Achieve Goals: Good Progress towards PT goals: Progressing toward goals    Frequency    7X/week      PT Plan Current plan remains appropriate     Co-evaluation PT/OT/SLP Co-Evaluation/Treatment: Yes Reason for Co-Treatment: For patient/therapist safety;To address functional/ADL transfers PT goals addressed during session: Mobility/safety with mobility;Proper use of DME        AM-PAC PT "6 Clicks" Daily Activity  Outcome Measure  Difficulty turning over in bed (including adjusting bedclothes, sheets and blankets)?: A Little Difficulty moving from lying on back to sitting on the side of the bed? : Unable Difficulty sitting down on and standing up from a chair with arms (e.g., wheelchair, bedside commode, etc,.)?: Unable Help needed moving to and from a bed to chair (including a wheelchair)?: A Little Help needed walking in hospital room?: A Lot Help needed climbing 3-5 steps with a railing? : A Lot 6 Click Score: 12    End of Session Equipment Utilized During Treatment: Gait belt Activity Tolerance: Patient tolerated treatment well Patient left: in chair;with call bell/phone within reach;with chair alarm set Nurse Communication: Mobility status PT Visit Diagnosis: Unsteadiness on feet (R26.81);Other abnormalities of gait and mobility (R26.89);History of falling (Z91.81);Pain Pain - Right/Left: Left Pain - part of body: Knee     Time: 9675-9163 PT Time Calculation (min) (ACUTE ONLY): 32 min  Charges:  $Gait Training: 8-22 mins                    G Codes:       Earney Navy, PTA Pager: [phone]     Darliss Cheney 02/16/2018, 11:24 AM

## 2018-02-17 MED FILL — NexAVAR 200 MG TABS: 200 | 30 days supply | Qty: 120 | Fill #0
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
## 2018-02-17 NOTE — Progress Notes (Signed)
Patient ID: Kyle Mejia, male   DOB: [birth year], 62 y.o.   MRN: [id number] Patient is a 62 year old gentleman status post left total knee arthroplasty.  Patient will need discharge to skilled nursing.  Discharge planning is pending.  Patient is developing a knee flexion contracture and the importance of working on knee extension was discussed with the patient.  This needs to be reinforced with therapy.  A pillow was placed under his ankle.

## 2018-02-17 NOTE — Plan of Care (Signed)
  Problem: Education: Goal: Knowledge of General Education information will improve Outcome: Progressing   Problem: Health Behavior/Discharge Planning: Goal: Ability to manage health-related needs will improve Outcome: Progressing   Problem: Clinical Measurements: Goal: Ability to maintain clinical measurements within normal limits will improve Outcome: Progressing Goal: Will remain free from infection Outcome: Progressing Goal: Diagnostic test results will improve Outcome: Progressing Goal: Respiratory complications will improve Outcome: Progressing Goal: Cardiovascular complication will be avoided Outcome: Progressing   Problem: Activity: Goal: Risk for activity intolerance will decrease Outcome: Progressing   Problem: Nutrition: Goal: Adequate nutrition will be maintained Outcome: Progressing   Problem: Coping: Goal: Level of anxiety will decrease Outcome: Progressing   Problem: Elimination: Goal: Will not experience complications related to bowel motility Outcome: Progressing Goal: Will not experience complications related to urinary retention Outcome: Progressing   Problem: Pain Managment: Goal: General experience of comfort will improve Outcome: Progressing   Problem: Safety: Goal: Ability to remain free from injury will improve Outcome: Progressing   Problem: Skin Integrity: Goal: Risk for impaired skin integrity will decrease Outcome: Progressing   Problem: Education: Goal: Knowledge of the prescribed therapeutic regimen will improve Outcome: Progressing   Problem: Activity: Goal: Ability to avoid complications of mobility impairment will improve Outcome: Progressing Goal: Range of joint motion will improve Outcome: Progressing   Problem: Clinical Measurements: Goal: Postoperative complications will be avoided or minimized Outcome: Progressing   Problem: Pain Management: Goal: Pain level will decrease with appropriate interventions Outcome:  Progressing   Problem: Skin Integrity: Goal: Signs of wound healing will improve Outcome: Progressing   

## 2018-02-17 NOTE — Social Work (Addendum)
Dr.Duda signed 30 day note for PASSR for SNF placement and the long form FL2.  CSW f/u on SNF offers and PASSR.  4:00pm: DGUYQ#[id number] E obtained for SNF placement.  Elissa Hefty, LCSW Clinical Social Worker [phone]

## 2018-02-17 NOTE — Progress Notes (Signed)
Physical Therapy Treatment Patient Details Name: Kyle Mejia MRN: [id number] DOB: 08-03-1956 Today's Date: 02/17/2018    History of Present Illness Pt is a 62 y/o male s/p elective L TKA. PMH includes TBI, HTN, alcohol abuse, tobacco abuse, hepatitis C, hepatic cancer, osteopenia.     PT Comments    Patient required increased assist this session due to pain with mobility and with difficulty achieving L knee extension. Pt educated on importance of resting with L LE positioned to promote knee extension. Pt with history of cognitive deficits and may need more assistance to maintain positioning for optimal ROM. Therapist discussed positioning of L LE with nurse tech and asked that they ensure pt maintains that resting position during the day.  Continue to progress as tolerated with anticipated d/c to SNF for further skilled PT services.    Follow Up Recommendations  Follow surgeon's recommendation for DC plan and follow-up therapies;Supervision for mobility/OOB     Equipment Recommendations  None recommended by PT    Recommendations for Other Services OT consult     Precautions / Restrictions Precautions Precautions: Knee;Fall Precaution Booklet Issued: Yes (comment) Precaution Comments: Reviwed knee precautions with pt Restrictions Weight Bearing Restrictions: Yes LLE Weight Bearing: Weight bearing as tolerated    Mobility  Bed Mobility Overal bed mobility: Needs Assistance Bed Mobility: Supine to Sit     Supine to sit: Min assist     General bed mobility comments: assist to bring L LE to EOB  Transfers Overall transfer level: Needs assistance Equipment used: Rolling walker (2 wheeled) Transfers: Sit to/from Stand Sit to Stand: Min assist;Mod assist         General transfer comment: min A to power up into standing and mod A upon standing for balance as pt demonstrated posterior bias  Ambulation/Gait Ambulation/Gait assistance: Mod assist;+2  safety/equipment(chair follow) Ambulation Distance (Feet): 12 Feet Assistive device: Rolling walker (2 wheeled) Gait Pattern/deviations: Step-to pattern;Decreased stance time - left;Decreased step length - right;Trunk flexed;Narrow base of support;Decreased step length - left;Decreased dorsiflexion - left;Antalgic     General Gait Details: cues for L heel strike and foot flat; pt with increasd difficulty with L knee extension this session even with facilitation by therapist during stance phase; limited by pain    Stairs            Wheelchair Mobility    Modified Rankin (Stroke Patients Only)       Balance Overall balance assessment: Needs assistance;History of Falls Sitting-balance support: No upper extremity supported;Feet supported Sitting balance-Leahy Scale: Good     Standing balance support: Bilateral upper extremity supported;No upper extremity supported Standing balance-Leahy Scale: Poor Standing balance comment: Reliant on BUE support.                             Cognition Arousal/Alertness: Awake/alert Behavior During Therapy: WFL for tasks assessed/performed Overall Cognitive Status: No family/caregiver present to determine baseline cognitive functioning(history of TBI)                                        Exercises Total Joint Exercises Ankle Circles/Pumps: AROM;Both;20 reps Quad Sets: AROM;Left;10 reps(verbal and tactile cues )    General Comments        Pertinent Vitals/Pain Pain Assessment: 0-10 Pain Score: 8  Faces Pain Scale: Hurts little more Pain Location: L knee  Pain Descriptors / Indicators: Sore;Guarding Pain Intervention(s): Limited activity within patient's tolerance;Monitored during session;Repositioned;Patient requesting pain meds-RN notified;Ice applied    Home Living                      Prior Function            PT Goals (current goals can now be found in the care plan section) Acute  Rehab PT Goals PT Goal Formulation: With patient Time For Goal Achievement: 03/01/18 Potential to Achieve Goals: Good Progress towards PT goals: Progressing toward goals    Frequency    7X/week      PT Plan Current plan remains appropriate    Co-evaluation              AM-PAC PT "6 Clicks" Daily Activity  Outcome Measure  Difficulty turning over in bed (including adjusting bedclothes, sheets and blankets)?: A Little Difficulty moving from lying on back to sitting on the side of the bed? : Unable Difficulty sitting down on and standing up from a chair with arms (e.g., wheelchair, bedside commode, etc,.)?: Unable Help needed moving to and from a bed to chair (including a wheelchair)?: A Little Help needed walking in hospital room?: A Lot Help needed climbing 3-5 steps with a railing? : Total 6 Click Score: 11    End of Session Equipment Utilized During Treatment: Gait belt Activity Tolerance: Patient limited by pain Patient left: in chair;with call bell/phone within reach;with chair alarm set;Other (comment)(L LE positioned to promote knee extension) Nurse Communication: Mobility status PT Visit Diagnosis: Unsteadiness on feet (R26.81);Other abnormalities of gait and mobility (R26.89);History of falling (Z91.81);Pain Pain - Right/Left: Left Pain - part of body: Knee     Time: 0912-0943 PT Time Calculation (min) (ACUTE ONLY): 31 min  Charges:  $Gait Training: 8-22 mins $Therapeutic Activity: 8-22 mins                    G Codes:       Earney Navy, PTA Pager: [phone]     Darliss Cheney 02/17/2018, 10:53 AM

## 2018-02-18 NOTE — Progress Notes (Signed)
Subjective: Patient stable.  Pain reasonably well controlled.  Sitting in the chair currently.   Objective: Vital signs in last 24 hours: Temp:  [96.8 F (36 C)-98.8 F (37.1 C)] 97.9 F (36.6 C) (03/30 0659) Pulse Rate:  [68-92] 68 (03/30 0835) BP: (84-147)/(50-84) 117/79 (03/30 0835) SpO2:  [98 %-100 %] 100 % (03/30 0659)  Intake/Output from previous day: 03/29 0701 - 03/30 0700 In: 480 [P.O.:480] Out: -  Intake/Output this shift: Total I/O In: 240 [P.O.:240] Out: -   Exam:  Dorsiflexion/Plantar flexion intact  Labs: No results for input(s): HGB in the last 72 hours. No results for input(s): WBC, RBC, HCT, PLT in the last 72 hours. No results for input(s): NA, K, CL, CO2, BUN, CREATININE, GLUCOSE, CALCIUM in the last 72 hours. No results for input(s): LABPT, INR in the last 72 hours.  Assessment/Plan: Plan is continue with therapy today.  Skilled nursing placement on Monday   Anderson Malta 02/18/2018, 10:29 AM

## 2018-02-18 NOTE — Progress Notes (Signed)
Physical Therapy Treatment Patient Details Name: Kyle Mejia MRN: [id number] DOB: 11/22/1956 Today's Date: 02/18/2018    History of Present Illness Pt is a 63 y/o male s/p elective L TKA. PMH includes TBI, HTN, alcohol abuse, tobacco abuse, hepatitis C, hepatic cancer, osteopenia.     PT Comments    Pt performed gait training complicated by bowel and bladder incontinence.  Pt required perianal care in standing due to incontinence.  During perianal care observed questionable stage 1 pressure and informed nursing. Pt remains unsafe to mobilize without assistance at this time.    Follow Up Recommendations  Follow surgeon's recommendation for DC plan and follow-up therapies;Supervision for mobility/OOB     Equipment Recommendations  None recommended by PT    Recommendations for Other Services OT consult     Precautions / Restrictions Precautions Precautions: Knee;Fall Precaution Booklet Issued: Yes (comment) Precaution Comments: Reviwed knee precautions with pt Restrictions Weight Bearing Restrictions: Yes LLE Weight Bearing: Weight bearing as tolerated Other Position/Activity Restrictions: Called Dr. Sharol Given to clarify weightbearing precautions and per MD LLE to be WBAT.     Mobility  Bed Mobility Overal bed mobility: Needs Assistance Bed Mobility: Supine to Sit     Supine to sit: Min assist     General bed mobility comments: Cued patient to advance LLE to edge of bed with hooking motion to RLE.    Transfers Overall transfer level: Needs assistance Equipment used: Rolling walker (2 wheeled) Transfers: Sit to/from Stand Sit to Stand: Mod assist Stand pivot transfers: Mod assist       General transfer comment: min A to power up into standing and mod A upon standing for balance as pt demonstrated posterior bias.  Ambulation/Gait Ambulation/Gait assistance: Mod assist;+2 safety/equipment Ambulation Distance (Feet): 45 Feet Assistive device: Rolling walker (2  wheeled) Gait Pattern/deviations: Step-to pattern;Decreased stance time - left;Decreased step length - right;Trunk flexed;Narrow base of support;Decreased step length - left;Decreased dorsiflexion - left;Antalgic     General Gait Details: Cues for L heel strike to achieve foot flat, when patient is able to place LLE on the floor flat he presents with L knee flexion. These deficits limit function and cause compensatory movement to achieve ambulation.     Stairs            Wheelchair Mobility    Modified Rankin (Stroke Patients Only)       Balance Overall balance assessment: Needs assistance;History of Falls Sitting-balance support: No upper extremity supported;Feet supported Sitting balance-Leahy Scale: Good     Standing balance support: Bilateral upper extremity supported;No upper extremity supported Standing balance-Leahy Scale: Poor Standing balance comment: Reliant on BUE support.                             Cognition Arousal/Alertness: Awake/alert Behavior During Therapy: WFL for tasks assessed/performed Overall Cognitive Status: No family/caregiver present to determine baseline cognitive functioning(hx of TBI)                                        Exercises Total Joint Exercises Quad Sets: AROM;Left;10 reps(elevated on pillow to promote extension)    General Comments        Pertinent Vitals/Pain Pain Assessment: 0-10 Pain Score: 6  Faces Pain Scale: Hurts little more Pain Location: L knee  Pain Descriptors / Indicators: Sore;Guarding Pain Intervention(s): Monitored during session;Repositioned  Home Living                      Prior Function            PT Goals (current goals can now be found in the care plan section) Acute Rehab PT Goals Patient Stated Goal: to be able to walk  Potential to Achieve Goals: Good Progress towards PT goals: Progressing toward goals    Frequency    7X/week      PT Plan  Current plan remains appropriate    Co-evaluation              AM-PAC PT "6 Clicks" Daily Activity  Outcome Measure  Difficulty turning over in bed (including adjusting bedclothes, sheets and blankets)?: A Little Difficulty moving from lying on back to sitting on the side of the bed? : Unable Difficulty sitting down on and standing up from a chair with arms (e.g., wheelchair, bedside commode, etc,.)?: Unable Help needed moving to and from a bed to chair (including a wheelchair)?: A Little Help needed walking in hospital room?: A Lot Help needed climbing 3-5 steps with a railing? : Total 6 Click Score: 11    End of Session Equipment Utilized During Treatment: Gait belt Activity Tolerance: Patient limited by pain Patient left: in chair;with call bell/phone within reach;with chair alarm set;Other (comment)(Positioned into LLE knee extension to promote extension at rest.  ) Nurse Communication: Mobility status PT Visit Diagnosis: Unsteadiness on feet (R26.81);Other abnormalities of gait and mobility (R26.89);History of falling (Z91.81);Pain Pain - Right/Left: Left Pain - part of body: Knee     Time: 9937-1696 PT Time Calculation (min) (ACUTE ONLY): 33 min  Charges:  $Gait Training: 8-22 mins $Therapeutic Exercise: 8-22 mins                    G Codes:       Governor Rooks, PTA pager [phone]    Cristela Blue 02/18/2018, 4:00 PM

## 2018-02-18 NOTE — Clinical Social Work Note (Addendum)
At this time pt has no bed offers.CSW reached out to facilities to determine if they could take the pt. The only two facility I have heard back from are [facility]--they can not offer at this time and [facility] beds avaliable. CSW continuing bed search.   [place], [place]

## 2018-02-18 NOTE — Progress Notes (Signed)
Pt sat in the chair for several hours today. Redness noted to sacrum and bil elbows. Foam dressing applied.

## 2018-02-19 ENCOUNTER — Encounter (HOSPITAL_COMMUNITY): Payer: Self-pay | Admitting: *Deleted

## 2018-02-19 NOTE — Progress Notes (Signed)
Subjective: Patient stable.  Pain controlled.  He was walking more in the room yesterday   Objective: Vital signs in last 24 hours: Temp:  [98.5 F (36.9 C)-99.1 F (37.3 C)] 98.5 F (36.9 C) (03/31 0343) Pulse Rate:  [68-95] 95 (03/31 0343) Resp:  [17-20] 20 (03/31 0343) BP: (112-142)/(64-80) 142/80 (03/31 0343) SpO2:  [100 %] 100 % (03/31 0343)  Intake/Output from previous day: 03/30 0701 - 03/31 0700 In: 820 [P.O.:720; I.V.:100] Out: 300 [Urine:300] Intake/Output this shift: No intake/output data recorded.  Exam:  Dorsiflexion/Plantar flexion intact  Labs: No results for input(s): HGB in the last 72 hours. No results for input(s): WBC, RBC, HCT, PLT in the last 72 hours. No results for input(s): NA, K, CL, CO2, BUN, CREATININE, GLUCOSE, CALCIUM in the last 72 hours. No results for input(s): LABPT, INR in the last 72 hours.  Assessment/Plan: Plan at this time is discharge this week.  Dr. Sharol Given to eval in a.m.  Patient is making good progress particularly over the weekend.   G Scott Clayvon Parlett 02/19/2018, 8:12 AM

## 2018-02-19 NOTE — Progress Notes (Signed)
Physical Therapy Treatment Patient Details Name: Kyle Mejia MRN: [id number] DOB: 1956-05-12 Today's Date: 02/19/2018    History of Present Illness Pt is a 62 y/o male s/p elective L TKA. PMH includes TBI, HTN, alcohol abuse, tobacco abuse, hepatitis C, hepatic cancer, osteopenia.     PT Comments    Patient report some improvement with pain with ambualtion. Is distance is still limited. His knee flexion is also limited. He would benefit from further skilled therapy to improve gait distance and knee flexion. He had increased pain with knee flexion.   Follow Up Recommendations  Follow surgeon's recommendation for DC plan and follow-up therapies;Supervision for mobility/OOB     Equipment Recommendations  None recommended by PT    Recommendations for Other Services       Precautions / Restrictions Precautions Precautions: Knee;Fall Precaution Booklet Issued: Yes (comment) Precaution Comments: Reviwed knee precautions with pt Restrictions Weight Bearing Restrictions: Yes LLE Weight Bearing: Weight bearing as tolerated    Mobility  Bed Mobility Overal bed mobility: Needs Assistance Bed Mobility: Supine to Sit     Supine to sit: Min guard;Min assist     General bed mobility comments: Patient continues to reuire min a to get his left leg in and out of the bed.   Transfers Overall transfer level: Needs assistance Equipment used: Rolling walker (2 wheeled)   Sit to Stand: Min assist         General transfer comment: min a for initial standing. Patient trnasfer from the bed and the commode. He required cuing when sitting to reach back.   Ambulation/Gait Ambulation/Gait assistance: Min assist Ambulation Distance (Feet): 40 Feet Assistive device: Rolling walker (2 wheeled) Gait Pattern/deviations: Step-to pattern;Decreased stance time - left;Decreased step length - right;Trunk flexed;Narrow base of support;Decreased step length - left;Decreased dorsiflexion -  left;Antalgic   Gait velocity interpretation: Below normal speed for age/gender General Gait Details: continued to require cuing for heel strike. He reported less pain then hthe last time he ambualted.    Stairs            Wheelchair Mobility    Modified Rankin (Stroke Patients Only)       Balance Overall balance assessment: Needs assistance;History of Falls Sitting-balance support: No upper extremity supported;Feet supported Sitting balance-Leahy Scale: Good     Standing balance support: Bilateral upper extremity supported;No upper extremity supported Standing balance-Leahy Scale: Poor Standing balance comment: Reliant on BUE support.                             Cognition Arousal/Alertness: Awake/alert Behavior During Therapy: WFL for tasks assessed/performed Overall Cognitive Status: No family/caregiver present to determine baseline cognitive functioning                                        Exercises Total Joint Exercises Goniometric ROM: per visual inspection knee motion remains limited     General Comments        Pertinent Vitals/Pain Pain Assessment: Faces Faces Pain Scale: Hurts little more Pain Location: L knee  Pain Descriptors / Indicators: Sore;Guarding Pain Intervention(s): Limited activity within patient's tolerance;Monitored during session;Premedicated before session    Home Living                      Prior Function  PT Goals (current goals can now be found in the care plan section) Acute Rehab PT Goals Patient Stated Goal: to be able to walk  PT Goal Formulation: With patient Potential to Achieve Goals: Good Progress towards PT goals: Progressing toward goals    Frequency    7X/week      PT Plan Current plan remains appropriate    Co-evaluation              AM-PAC PT "6 Clicks" Daily Activity  Outcome Measure  Difficulty turning over in bed (including adjusting  bedclothes, sheets and blankets)?: A Little Difficulty moving from lying on back to sitting on the side of the bed? : Unable Difficulty sitting down on and standing up from a chair with arms (e.g., wheelchair, bedside commode, etc,.)?: Unable Help needed moving to and from a bed to chair (including a wheelchair)?: A Little Help needed walking in hospital room?: A Lot Help needed climbing 3-5 steps with a railing? : Total 6 Click Score: 11    End of Session Equipment Utilized During Treatment: Gait belt Activity Tolerance: Patient limited by pain Patient left: in chair;with call bell/phone within reach;with chair alarm set;Other (comment)   PT Visit Diagnosis: Unsteadiness on feet (R26.81);Other abnormalities of gait and mobility (R26.89);History of falling (Z91.81);Pain Pain - Right/Left: Left Pain - part of body: Knee     Time: 6759-1638 PT Time Calculation (min) (ACUTE ONLY): 17 min  Charges:  $Gait Training: 8-22 mins                    G Codes:        Carney Living PT [name]  02/19/2018, 3:25 PM

## 2018-02-20 MED ORDER — SORAFENIB TOSYLATE 200 MG PO TABS: 200.0000 mg | ORAL_TABLET | Freq: Two times a day (BID) | ORAL | Status: DC

## 2018-02-20 MED ORDER — OXYCODONE-ACETAMINOPHEN 5-325 MG PO TABS: 1.0000 | ORAL_TABLET | ORAL | 0 refills | Status: DC | PRN

## 2018-02-20 NOTE — Progress Notes (Signed)
Physical Therapy Treatment Patient Details Name: Kyle Mejia MRN: [id number] DOB: May 03, 1956 Today's Date: 02/20/2018    History of Present Illness Pt is a 62 y/o male s/p elective L TKA. PMH includes TBI, HTN, alcohol abuse, tobacco abuse, hepatitis C, hepatic cancer, osteopenia.     PT Comments    Patient tolerated session well and is making gradual progress toward PT goals. Pt continues to require min guard/min A for safe OOB mobility and will need 24 hour assistance/supervision upon d/c. Continue to progress as tolerated.   Follow Up Recommendations  Follow surgeon's recommendation for DC plan and follow-up therapies;Supervision for mobility/OOB     Equipment Recommendations  None recommended by PT    Recommendations for Other Services OT consult     Precautions / Restrictions Precautions Precautions: Knee;Fall Precaution Comments: Reviwed knee precautions with pt Restrictions Weight Bearing Restrictions: Yes LLE Weight Bearing: Weight bearing as tolerated    Mobility  Bed Mobility Overal bed mobility: Needs Assistance Bed Mobility: Supine to Sit     Supine to sit: Min guard     General bed mobility comments: min guard for safety; increased time and effort  Transfers Overall transfer level: Needs assistance Equipment used: Rolling walker (2 wheeled) Transfers: Sit to/from Stand Sit to Stand: Min guard         General transfer comment: cues for safe hand placement and stepping closer to surface using RW when returning to sitting   Ambulation/Gait Ambulation/Gait assistance: Min assist Ambulation Distance (Feet): 100 Feet Assistive device: Rolling walker (2 wheeled) Gait Pattern/deviations: Step-to pattern;Decreased stance time - left;Decreased step length - right;Trunk flexed;Narrow base of support;Decreased step length - left;Decreased dorsiflexion - left;Antalgic Gait velocity: decreased   General Gait Details: cues for L heel strike/toe off,  posture, safe proximity to RW, and L quad activation during stance phase    Stairs            Wheelchair Mobility    Modified Rankin (Stroke Patients Only)       Balance Overall balance assessment: Needs assistance;History of Falls Sitting-balance support: No upper extremity supported;Feet supported Sitting balance-Leahy Scale: Good     Standing balance support: Bilateral upper extremity supported;No upper extremity supported Standing balance-Leahy Scale: Poor Standing balance comment: Reliant on BUE support.                             Cognition Arousal/Alertness: Awake/alert Behavior During Therapy: WFL for tasks assessed/performed Overall Cognitive Status: History of cognitive impairments - at baseline                                        Exercises      General Comments        Pertinent Vitals/Pain Pain Assessment: Faces Faces Pain Scale: Hurts little more Pain Location: L knee  Pain Descriptors / Indicators: Sore;Guarding Pain Intervention(s): Limited activity within patient's tolerance;Monitored during session;Premedicated before session;Repositioned;Ice applied    Home Living                      Prior Function            PT Goals (current goals can now be found in the care plan section) Acute Rehab PT Goals PT Goal Formulation: With patient Time For Goal Achievement: 03/01/18 Potential to Achieve Goals: Good Progress towards PT  goals: Progressing toward goals    Frequency    7X/week      PT Plan Current plan remains appropriate    Co-evaluation              AM-PAC PT "6 Clicks" Daily Activity  Outcome Measure  Difficulty turning over in bed (including adjusting bedclothes, sheets and blankets)?: A Little Difficulty moving from lying on back to sitting on the side of the bed? : A Lot Difficulty sitting down on and standing up from a chair with arms (e.g., wheelchair, bedside commode,  etc,.)?: Unable Help needed moving to and from a bed to chair (including a wheelchair)?: A Little Help needed walking in hospital room?: A Little Help needed climbing 3-5 steps with a railing? : Total 6 Click Score: 13    End of Session Equipment Utilized During Treatment: Gait belt Activity Tolerance: Patient tolerated treatment well Patient left: in chair;with call bell/phone within reach;with chair alarm set Nurse Communication: Mobility status PT Visit Diagnosis: Unsteadiness on feet (R26.81);Other abnormalities of gait and mobility (R26.89);History of falling (Z91.81);Pain Pain - Right/Left: Left Pain - part of body: Knee     Time: 9811-9147 PT Time Calculation (min) (ACUTE ONLY): 33 min  Charges:  $Gait Training: 8-22 mins $Therapeutic Activity: 8-22 mins                    G Codes:       Earney Navy, PTA Pager: [phone]     Darliss Cheney 02/20/2018, 9:49 AM

## 2018-02-20 NOTE — Discharge Instructions (Signed)

## 2018-02-20 NOTE — Social Work (Signed)
CSW f/u on SNF offers.  CSW discussed with admission at [facility] as they may be the only SNF's that can offer SNF bed.  CSW will continue to follow.  Elissa Hefty, LCSW Clinical Social Worker [phone]

## 2018-02-20 NOTE — Progress Notes (Signed)
Patient ID: Kyle Mejia, male   DOB: 31-Oct-1956, 62 y.o.   MRN: [id number] Patient is status post total knee arthroplasty.  Patient states that he does not feel safe returning home.  Patient has not been accepted at a skilled nursing facility at this time.  Will need to make further progress with therapy for discharge to home or find a skilled nursing facility.

## 2018-02-20 NOTE — Discharge Summary (Signed)
Discharge Diagnoses:  Active Problems:   Total knee replacement status, left   Unilateral primary osteoarthritis, left knee   Surgeries: Procedure(s): LEFT TOTAL KNEE ARTHROPLASTY on 02/15/2018    Consultants:   Discharged Condition: Improved  Hospital Course: Kyle Mejia is an 62 y.o. male who was admitted 02/15/2018 with a chief complaint of traumatic osteoarthritis left knee, with a final diagnosis of TRAUMATIC OA LEFT KNEE.  Patient was brought to the operating room on 02/15/2018 and underwent Procedure(s): LEFT TOTAL KNEE ARTHROPLASTY.    Patient was given perioperative antibiotics:  Anti-infectives (From admission, onward)   Start     Dose/Rate Route Frequency Ordered Stop   02/15/18 1330  ceFAZolin (ANCEF) IVPB 1 g/50 mL premix     1 g 100 mL/hr over 30 Minutes Intravenous Every 6 hours 02/15/18 1238 02/15/18 2157   02/15/18 0556  ceFAZolin (ANCEF) 2-4 GM/100ML-% IVPB    Note to Pharmacy:  Starleen Arms   : cabinet override      02/15/18 0556 02/15/18 1759   02/15/18 0537  ceFAZolin (ANCEF) IVPB 2g/100 mL premix     2 g 200 mL/hr over 30 Minutes Intravenous On call to O.R. 02/15/18 2595 02/15/18 0751    .  Patient was given sequential compression devices, early ambulation, and aspirin for DVT prophylaxis.  Recent vital signs:  Patient Vitals for the past 24 hrs:  BP Temp Temp src Pulse Resp SpO2  02/20/18 0423 115/62 98.2 F (36.8 C) Oral 69 16 100 %  02/19/18 1950 122/66 98.3 F (36.8 C) Oral 76 17 100 %  02/19/18 1500 (!) 120/50 98.8 F (37.1 C) Oral 75 - 100 %  .  Recent laboratory studies: No results found.  Discharge Medications:   Allergies as of 02/20/2018   No Known Allergies     Medication List    TAKE these medications   albuterol 108 (90 Base) MCG/ACT inhaler Commonly known as:  PROVENTIL HFA;VENTOLIN HFA Inhale 2 puffs into the lungs every 6 (six) hours as needed for wheezing or shortness of breath.   cholestyramine 4 g packet Commonly  known as:  QUESTRAN Take 4 g by mouth every morning. Mix in 2-6 oz. Of water or juice and drink once daily   clonazePAM 0.5 MG tablet Commonly known as:  KLONOPIN TAKE 0.5MG  BY twice per day What changed:    how much to take  how to take this  when to take this  additional instructions   cloNIDine 0.2 MG tablet Commonly known as:  CATAPRES Take 1 tablet (0.2 mg total) by mouth 2 (two) times daily. What changed:  additional instructions   dextromethorphan-guaiFENesin 30-600 MG 12hr tablet Commonly known as:  MUCINEX DM Take 1 tablet by mouth 2 (two) times daily.   dicyclomine 20 MG tablet Commonly known as:  BENTYL Take 1 tablet (20 mg total) by mouth 4 (four) times daily -  before meals and at bedtime for 10 days.   escitalopram 10 MG tablet Commonly known as:  LEXAPRO Take 1 tablet (10 mg total) by mouth at bedtime.   folic acid 1 MG tablet Commonly known as:  FOLVITE Take 1 tablet (1 mg total) by mouth daily.   hydrOXYzine 50 MG tablet Commonly known as:  ATARAX/VISTARIL Take 4 tablets (200 mg total) by mouth at bedtime. What changed:  additional instructions   lisinopril 10 MG tablet Commonly known as:  PRINIVIL,ZESTRIL ONE TABLET BY MOUTH EACH DAY What changed:    how much to  take  how to take this  when to take this  additional instructions   loperamide 2 MG capsule Commonly known as:  IMODIUM Take 2 tablets after first loose stool, then 1 tablet after each subsequent loose stool, maximum 8 tablets per day What changed:    how much to take  how to take this  when to take this  reasons to take this  additional instructions   Magnesium 400 MG Tabs Take 400 mg by mouth 2 (two) times daily. For treatment of hypomagnesia   metoprolol succinate 25 MG 24 hr tablet Commonly known as:  TOPROL-XL Take 1 tablet (25 mg total) by mouth daily. What changed:  additional instructions   NEXAVAR 200 MG tablet Generic drug:  SORAfenib TAKE 2 TABLETS  (400MG ) BY MOUTH TWO TIMES DAILY. GIVE ON AN EMPTY STOMACH 1 HOUR BEFORE OR 2 HOURS AFTER MEALS.   omeprazole 20 MG capsule Commonly known as:  PRILOSEC Take 20 mg by mouth daily.   oxyCODONE-acetaminophen 5-325 MG tablet Commonly known as:  PERCOCET/ROXICET Take 1 tablet by mouth every 4 (four) hours as needed for severe pain.   PARoxetine 20 MG tablet Commonly known as:  PAXIL Take 1 tablet (20 mg total) by mouth daily. What changed:  additional instructions   potassium chloride SA 20 MEQ tablet Commonly known as:  K-DUR,KLOR-CON Take 1 tablet (20 mEq total) by mouth daily.            Discharge Care Instructions  (From admission, onward)        Start     Ordered   02/20/18 0000  Change dressing    Comments:  Change dressing as needed okay to get the incision wet.   02/20/18 1256      Diagnostic Studies: No results found.  Patient benefited maximally from their hospital stay and there were no complications.     Disposition: Discharge disposition: 03-Skilled Nursing Facility      Discharge Instructions    Call MD / Call 911   Complete by:  As directed    If you experience chest pain or shortness of breath, CALL 911 and be transported to the hospital emergency room.  If you develope a fever above 101 F, pus (white drainage) or increased drainage or redness at the wound, or calf pain, call your surgeon's office.   Call MD / Call 911   Complete by:  As directed    If you experience chest pain or shortness of breath, CALL 911 and be transported to the hospital emergency room.  If you develope a fever above 101 F, pus (white drainage) or increased drainage or redness at the wound, or calf pain, call your surgeon's office.   Change dressing   Complete by:  As directed    Change dressing as needed okay to get the incision wet.   Constipation Prevention   Complete by:  As directed    Drink plenty of fluids.  Prune juice may be helpful.  You may use a stool softener,  such as Colace (over the counter) 100 mg twice a day.  Use MiraLax (over the counter) for constipation as needed.   Constipation Prevention   Complete by:  As directed    Drink plenty of fluids.  Prune juice may be helpful.  You may use a stool softener, such as Colace (over the counter) 100 mg twice a day.  Use MiraLax (over the counter) for constipation as needed.   Diet - low sodium  heart healthy   Complete by:  As directed    Diet - low sodium heart healthy   Complete by:  As directed    Increase activity slowly as tolerated   Complete by:  As directed    Increase activity slowly as tolerated   Complete by:  As directed      Follow-up Information    Newt Minion, MD In 1 week.   Specialty:  Orthopedic Surgery Contact information: [place] [address] [phone]            Signed: Newt Minion 02/20/2018, 1:00 PM

## 2018-02-20 NOTE — Social Work (Signed)
CSW f/u on placement. SNF-[facility] has offered a SNF bed and patient will transfer to SNF when ready.  CSW called sister Vaughan Basta and advised of placement.  CSW will f/u for disposition.  Elissa Hefty, LCSW Clinical Social Worker [phone]

## 2018-02-20 NOTE — Clinical Social Work Placement (Signed)
   CLINICAL SOCIAL WORK PLACEMENT  NOTE  Date:  02/20/2018  Patient Details  Name: Kyle Mejia MRN: [id number] Date of Birth: 1956-10-06  Clinical Social Work is seeking post-discharge placement for this patient at the [place] level of care (*CSW will initial, date and re-position this form in  chart as items are completed):  Yes   Patient/family provided with [place] Work Department's list of facilities offering this level of care within the geographic area requested by the patient (or if unable, by the patient's family).  Yes   Patient/family informed of their freedom to choose among providers that offer the needed level of care, that participate in Medicare, Medicaid or managed care program needed by the patient, have an available bed and are willing to accept the patient.  Yes   Patient/family informed of [place]'s ownership interest in [facility] and [facility] as well as of the fact that they are under no obligation to receive care at these facilities.  PASRR submitted to EDS on       PASRR number received on 02/17/18     Existing PASRR number confirmed on       FL2 transmitted to all facilities in geographic area requested by pt/family on 02/17/18     FL2 transmitted to all facilities within larger geographic area on       Patient informed that his/her managed care company has contracts with or will negotiate with certain facilities, including the following:        Yes   Patient/family informed of bed offers received.  Patient chooses bed at [facility])     Physician recommends and patient chooses bed at      Patient to be transferred to [facility]) on 02/20/18.  Patient to be transferred to facility by [facility]     Patient family notified on 02/20/18 of transfer.  Name of family member notified:  Vaughan Basta contacted     PHYSICIAN       Additional Comment:     _______________________________________________ Normajean Baxter, LCSW 02/20/2018, 2:13 PM

## 2018-02-20 NOTE — Social Work (Signed)
SNF reviewed med list and indicated that the medication Nexavar is too expensive and they withdrew their SNF bed offer.  CSW discussed with Asst. Clini Supervisor and we may need to make difficult to place.  CSW will f/u for placement.  5:00pm CSW then called [facility], his assisted living facility to find out how do they cover that Nexavar and the facility indicated that the family member picks the medication up in the community and brings to nursing facility because it is too expensive.    CSW then called sister to advise that patient will not dc today to SNF-[facility] as the Nexavar medication is too costly. Sister confirmed and indicated that no facility wold cover and that is why they have the significant other pick up medication and bring to nursing facility.  CSW then called admission staff back at SNF to see if they would allow family member to bring medication to facility. SNF will f/u and get back to CSW.   At this time, patient will not discharge as we are still working on placement due to medication.  Elissa Hefty, LCSW Clinical Social Worker [phone]

## 2018-02-21 NOTE — Social Work (Signed)
CSW discussed case with Asst. Clini. Director and since patient has cancer medication Nexavar,may be barriers to placement if SNF will not allow family to bring medicine.  CSW then spoke with admission at SNF([facility]) and they will not be able to take patient because of  physical therapy may not be covered by medicaid if patient does not stay 30 days.  Asst Clini Director will not cover the physical therapy for 2 weeks.   CSW will f/u as patient will more than likely be a difficult to place as result of no SNF offering a SNF bed.  CSW f/u for placement.  Elissa Hefty, LCSW Clinical Social Worker [phone]

## 2018-02-21 NOTE — Clinical Social Work Placement (Signed)
   CLINICAL SOCIAL WORK PLACEMENT  NOTE  Date:  02/21/2018  Patient Details  Name: Kyle Mejia MRN: [id number] Date of Birth: September 29, 1956  Clinical Social Work is seeking post-discharge placement for this patient at the [place] level of care (*CSW will initial, date and re-position this form in  chart as items are completed):  Yes   Patient/family provided with [place] Work Department's list of facilities offering this level of care within the geographic area requested by the patient (or if unable, by the patient's family).  Yes   Patient/family informed of their freedom to choose among providers that offer the needed level of care, that participate in Medicare, Medicaid or managed care program needed by the patient, have an available bed and are willing to accept the patient.  Yes   Patient/family informed of [place]'s ownership interest in [facility] and [facility], as well as of the fact that they are under no obligation to receive care at these facilities.  PASRR submitted to EDS on       PASRR number received on 02/17/18     Existing PASRR number confirmed on       FL2 transmitted to all facilities in geographic area requested by pt/family on 02/17/18     FL2 transmitted to all facilities within larger geographic area on       Patient informed that his/her managed care company has contracts with or will negotiate with certain facilities, including the following:        Yes   Patient/family informed of bed offers received.  Patient chooses bed at [facility])     Physician recommends and patient chooses bed at      Patient to be transferred to [facility]) on 02/21/18.  Patient to be transferred to facility by [facility]     Patient family notified on 02/21/18 of transfer.  Name of family member notified:  Vaughan Basta contacted     PHYSICIAN       Additional Comment:     _______________________________________________ Normajean Baxter, LCSW 02/21/2018, 2:59 PM

## 2018-02-21 NOTE — Discharge Summary (Signed)
  No change in discharge status, discharge to SNF today

## 2018-02-21 NOTE — Progress Notes (Addendum)
Physical Therapy Treatment Patient Details Name: Kyle Mejia MRN: [id number] DOB: 12-Jul-1956 Today's Date: 02/21/2018    History of Present Illness Pt is a 62 y/o male s/p elective L TKA. PMH includes TBI, HTN, alcohol abuse, tobacco abuse, hepatitis C, hepatic cancer, osteopenia.     PT Comments    Patient continues to make progress toward PT goals. Pt does need increased time for tasks and needs multimodal cues for gait mechanics and therex.  Given pt's balance and cognitive deficits pt will need 24 hour supervision/assistance upon d/c. Continue to progress as tolerated.   Follow Up Recommendations  Follow surgeon's recommendation for DC plan and follow-up therapies;Supervision for mobility/OOB     Equipment Recommendations  None recommended by PT    Recommendations for Other Services OT consult     Precautions / Restrictions Precautions Precautions: Knee;Fall Precaution Comments: Reviwed knee precautions with pt Restrictions Weight Bearing Restrictions: Yes LLE Weight Bearing: Weight bearing as tolerated    Mobility  Bed Mobility Overal bed mobility: Modified Independent Bed Mobility: Supine to Sit           General bed mobility comments: increased time and effort  Transfers Overall transfer level: Needs assistance Equipment used: Rolling walker (2 wheeled) Transfers: Sit to/from Stand Sit to Stand: Min guard         General transfer comment: min guard for safety; safe hand placement demonstrated  Ambulation/Gait Ambulation/Gait assistance: Min assist Ambulation Distance (Feet): 90 Feet Assistive device: Rolling walker (2 wheeled) Gait Pattern/deviations: Step-to pattern;Decreased step length - right;Trunk flexed;Narrow base of support;Decreased dorsiflexion - left;Antalgic;Decreased stance time - left Gait velocity: decreased   General Gait Details: cues for posture/forward gaze, proximity to RW, and R heel strike/toe off; pt has difficulty  following vc only and needs visual and tactile cues for best outcomes   Stairs            Wheelchair Mobility    Modified Rankin (Stroke Patients Only)       Balance Overall balance assessment: Needs assistance;History of Falls Sitting-balance support: No upper extremity supported;Feet supported Sitting balance-Leahy Scale: Good     Standing balance support: Bilateral upper extremity supported;No upper extremity supported Standing balance-Leahy Scale: Poor Standing balance comment: Reliant on BUE support.                             Cognition Arousal/Alertness: Awake/alert Behavior During Therapy: WFL for tasks assessed/performed Overall Cognitive Status: History of cognitive impairments - at baseline                                        Exercises Total Joint Exercises Ankle Circles/Pumps: AROM;Both;10 reps Heel Slides: AROM;Left;15 reps Hip ABduction/ADduction: AROM;Left;15 reps    General Comments        Pertinent Vitals/Pain Pain Assessment: Faces Faces Pain Scale: Hurts little more Pain Location: L knee  Pain Descriptors / Indicators: Sore Pain Intervention(s): Monitored during session;Repositioned    Home Living                      Prior Function            PT Goals (current goals can now be found in the care plan section) Acute Rehab PT Goals PT Goal Formulation: With patient Time For Goal Achievement: 03/01/18 Potential to Achieve Goals: Good Progress  towards PT goals: Progressing toward goals    Frequency    7X/week      PT Plan Current plan remains appropriate    Co-evaluation              AM-PAC PT "6 Clicks" Daily Activity  Outcome Measure  Difficulty turning over in bed (including adjusting bedclothes, sheets and blankets)?: A Little Difficulty moving from lying on back to sitting on the side of the bed? : A Lot Difficulty sitting down on and standing up from a chair with arms  (e.g., wheelchair, bedside commode, etc,.)?: Unable Help needed moving to and from a bed to chair (including a wheelchair)?: A Little Help needed walking in hospital room?: A Little Help needed climbing 3-5 steps with a railing? : A Lot 6 Click Score: 14    End of Session Equipment Utilized During Treatment: Gait belt Activity Tolerance: Patient tolerated treatment well Patient left: in chair;with call bell/phone within reach;with chair alarm set Nurse Communication: Mobility status PT Visit Diagnosis: Unsteadiness on feet (R26.81);Other abnormalities of gait and mobility (R26.89);History of falling (Z91.81);Pain Pain - Right/Left: Left Pain - part of body: Knee     Time: 1415-1455 PT Time Calculation (min) (ACUTE ONLY): 40 min  Charges:  $Gait Training: 23-37 mins $Therapeutic Exercise: 8-22 mins                    G Codes:       Earney Navy, PTA Pager: [phone]     Darliss Cheney 02/21/2018, 4:02 PM

## 2018-02-21 NOTE — Social Work (Addendum)
CSW contacted [facility] [phone] and Ms. Forman at assisted indicated that they could take patient back with therapy if he is not a two person assist. CSW then reviewed PT notes and discussed. CSW advised that staff had advised otherwise previously. Ms. Shanda Bumps explained that it depends on case and they can take patient back. They confirmed that they use [facility] or [facility] for home health PT and that they would need FL2 faxed, dc summary for patient to return, fax#[phone].  CSW will f/u with RNCM to discuss and CSW will f/u with patient and family.  2:00pm-CSW spoke with sister Vaughan Basta and she was pleased that patient would be able to return to [facility] and receive therapy. She was concerned that he would lose his bed at facility as they may not have been able to hold it during the time at a rehab facility.  CSW validated her feelings.  CSW will f/u for disposition.  Elissa Hefty, LCSW Clinical Social Worker [phone]
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
## 2018-02-21 NOTE — Progress Notes (Signed)
Patient's BP is 81/55, SNF stated they will not accept patient with BP in the 80's. Dr. Sharol Given was notified. Dr. Sharol Given gave verbal order to cancel discharge, encourage oral fluids and hold all BP medications. Patient remain asymptomatic. Will continue to monitor patient.

## 2018-02-21 NOTE — Discharge Summary (Signed)
Discharge Diagnoses:  Active Problems:   Total knee replacement status, left   Unilateral primary osteoarthritis, left knee   Surgeries: Procedure(s): LEFT TOTAL KNEE ARTHROPLASTY on 02/15/2018    Consultants:   Discharged Condition: Improved  Hospital Course: Kyle Mejia is an 62 y.o. male who was admitted 02/15/2018 with a chief complaint of traumatic arthritis left knee, with a final diagnosis of TRAUMATIC OA LEFT KNEE.  Patient was brought to the operating room on 02/15/2018 and underwent Procedure(s): LEFT TOTAL KNEE ARTHROPLASTY.    Patient was given perioperative antibiotics:  Anti-infectives (From admission, onward)   Start     Dose/Rate Route Frequency Ordered Stop   02/15/18 1330  ceFAZolin (ANCEF) IVPB 1 g/50 mL premix     1 g 100 mL/hr over 30 Minutes Intravenous Every 6 hours 02/15/18 1238 02/15/18 2157   02/15/18 0556  ceFAZolin (ANCEF) 2-4 GM/100ML-% IVPB    Note to Pharmacy:  Starleen Arms   : cabinet override      02/15/18 0556 02/15/18 1759   02/15/18 0537  ceFAZolin (ANCEF) IVPB 2g/100 mL premix     2 g 200 mL/hr over 30 Minutes Intravenous On call to O.R. 02/15/18 5784 02/15/18 0751    .  Patient was given sequential compression devices, early ambulation, and aspirin for DVT prophylaxis.  Recent vital signs:  Patient Vitals for the past 24 hrs:  BP Temp Temp src Pulse Resp SpO2  02/21/18 1100 (!) 83/51 99 F (37.2 C) Oral 63 16 96 %  02/21/18 0431 105/65 98.1 F (36.7 C) Oral 69 16 98 %  02/20/18 2101 110/73 99 F (37.2 C) Oral 74 16 98 %  .  Recent laboratory studies: No results found.  Discharge Medications:   Allergies as of 02/21/2018   No Known Allergies     Medication List    TAKE these medications   albuterol 108 (90 Base) MCG/ACT inhaler Commonly known as:  PROVENTIL HFA;VENTOLIN HFA Inhale 2 puffs into the lungs every 6 (six) hours as needed for wheezing or shortness of breath.   cholestyramine 4 g packet Commonly known as:   QUESTRAN Take 4 g by mouth every morning. Mix in 2-6 oz. Of water or juice and drink once daily   clonazePAM 0.5 MG tablet Commonly known as:  KLONOPIN TAKE 0.5MG  BY twice per day What changed:    how much to take  how to take this  when to take this  additional instructions   cloNIDine 0.2 MG tablet Commonly known as:  CATAPRES Take 1 tablet (0.2 mg total) by mouth 2 (two) times daily. What changed:  additional instructions   dextromethorphan-guaiFENesin 30-600 MG 12hr tablet Commonly known as:  MUCINEX DM Take 1 tablet by mouth 2 (two) times daily.   dicyclomine 20 MG tablet Commonly known as:  BENTYL Take 1 tablet (20 mg total) by mouth 4 (four) times daily -  before meals and at bedtime for 10 days.   escitalopram 10 MG tablet Commonly known as:  LEXAPRO Take 1 tablet (10 mg total) by mouth at bedtime.   folic acid 1 MG tablet Commonly known as:  FOLVITE Take 1 tablet (1 mg total) by mouth daily.   hydrOXYzine 50 MG tablet Commonly known as:  ATARAX/VISTARIL Take 4 tablets (200 mg total) by mouth at bedtime. What changed:  additional instructions   lisinopril 10 MG tablet Commonly known as:  PRINIVIL,ZESTRIL ONE TABLET BY MOUTH EACH DAY What changed:    how much to  take  how to take this  when to take this  additional instructions   loperamide 2 MG capsule Commonly known as:  IMODIUM Take 2 tablets after first loose stool, then 1 tablet after each subsequent loose stool, maximum 8 tablets per day What changed:    how much to take  how to take this  when to take this  reasons to take this  additional instructions   Magnesium 400 MG Tabs Take 400 mg by mouth 2 (two) times daily. For treatment of hypomagnesia   metoprolol succinate 25 MG 24 hr tablet Commonly known as:  TOPROL-XL Take 1 tablet (25 mg total) by mouth daily. What changed:  additional instructions   NEXAVAR 200 MG tablet Generic drug:  SORAfenib TAKE 2 TABLETS (400MG ) BY  MOUTH TWO TIMES DAILY. GIVE ON AN EMPTY STOMACH 1 HOUR BEFORE OR 2 HOURS AFTER MEALS.   omeprazole 20 MG capsule Commonly known as:  PRILOSEC Take 20 mg by mouth daily.   oxyCODONE-acetaminophen 5-325 MG tablet Commonly known as:  PERCOCET/ROXICET Take 1 tablet by mouth every 4 (four) hours as needed for severe pain.   PARoxetine 20 MG tablet Commonly known as:  PAXIL Take 1 tablet (20 mg total) by mouth daily. What changed:  additional instructions   potassium chloride SA 20 MEQ tablet Commonly known as:  K-DUR,KLOR-CON Take 1 tablet (20 mEq total) by mouth daily.            Discharge Care Instructions  (From admission, onward)        Start     Ordered   02/20/18 0000  Change dressing    Comments:  Change dressing as needed okay to get the incision wet.   02/20/18 1256      Diagnostic Studies: No results found.  Patient benefited maximally from their hospital stay and there were no complications.     Disposition: Discharge disposition: 03-Skilled Nursing Facility      Discharge Instructions    Call MD / Call 911   Complete by:  As directed    If you experience chest pain or shortness of breath, CALL 911 and be transported to the hospital emergency room.  If you develope a fever above 101 F, pus (white drainage) or increased drainage or redness at the wound, or calf pain, call your surgeon's office.   Call MD / Call 911   Complete by:  As directed    If you experience chest pain or shortness of breath, CALL 911 and be transported to the hospital emergency room.  If you develope a fever above 101 F, pus (white drainage) or increased drainage or redness at the wound, or calf pain, call your surgeon's office.   Call MD / Call 911   Complete by:  As directed    If you experience chest pain or shortness of breath, CALL 911 and be transported to the hospital emergency room.  If you develope a fever above 101 F, pus (white drainage) or increased drainage or redness at  the wound, or calf pain, call your surgeon's office.   Call MD / Call 911   Complete by:  As directed    If you experience chest pain or shortness of breath, CALL 911 and be transported to the hospital emergency room.  If you develope a fever above 101 F, pus (white drainage) or increased drainage or redness at the wound, or calf pain, call your surgeon's office.   Change dressing   Complete by:  As directed    Change dressing as needed okay to get the incision wet.   Constipation Prevention   Complete by:  As directed    Drink plenty of fluids.  Prune juice may be helpful.  You may use a stool softener, such as Colace (over the counter) 100 mg twice a day.  Use MiraLax (over the counter) for constipation as needed.   Constipation Prevention   Complete by:  As directed    Drink plenty of fluids.  Prune juice may be helpful.  You may use a stool softener, such as Colace (over the counter) 100 mg twice a day.  Use MiraLax (over the counter) for constipation as needed.   Constipation Prevention   Complete by:  As directed    Drink plenty of fluids.  Prune juice may be helpful.  You may use a stool softener, such as Colace (over the counter) 100 mg twice a day.  Use MiraLax (over the counter) for constipation as needed.   Constipation Prevention   Complete by:  As directed    Drink plenty of fluids.  Prune juice may be helpful.  You may use a stool softener, such as Colace (over the counter) 100 mg twice a day.  Use MiraLax (over the counter) for constipation as needed.   Diet - low sodium heart healthy   Complete by:  As directed    Diet - low sodium heart healthy   Complete by:  As directed    Diet - low sodium heart healthy   Complete by:  As directed    Diet - low sodium heart healthy   Complete by:  As directed    Increase activity slowly as tolerated   Complete by:  As directed    Increase activity slowly as tolerated   Complete by:  As directed    Increase activity slowly as  tolerated   Complete by:  As directed    Increase activity slowly as tolerated   Complete by:  As directed       Contact information for follow-up providers    Newt Minion, MD In 1 week.   Specialty:  Orthopedic Surgery Contact information: [place] [id number] [phone]            Contact information for after-discharge care    Destination    [facility] .   Service:  Skilled Nursing Contact information: [address] [place] [phone]                   Signed: Newt Minion 02/21/2018, 2:14 PM
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
## 2018-02-21 NOTE — Social Work (Signed)
CSW was advised that patient has low BP and will not be able to go to SNF at this time. Floor RN following up with Dr. Sharol Given.  CSW then contacted assisted living facility and advised Ms. Kyle Mejia of same.  CSW will f/u for disposition when patient is stable.  Elissa Hefty, LCSW Clinical Social Worker [phone]

## 2018-02-21 NOTE — Social Work (Signed)
Clinical Social Worker facilitated patient discharge including contacting patient family and facility to confirm patient discharge plans.  Clinical information faxed to facility and family agreeable with plan.    CSW arranged ambulance transport via [facility] .    RN to call [phone] ask for Lowella Grip to give report prior to discharge.  Clinical Social Worker will sign off for now as social work intervention is no longer needed. Please consult [place] again if new need arises.  Elissa Hefty, LCSW Clinical Social Worker [phone]

## 2018-02-22 NOTE — Progress Notes (Signed)
RN called [facility] and spoke to Network engineer who stated they are at lunch and it is not a good time I should leave a message and they will call me back

## 2018-02-22 NOTE — Progress Notes (Signed)
RN called and gave report to Lowella Grip, pt comfortable and leaving PTAR.

## 2018-02-22 NOTE — Progress Notes (Signed)
Patient ID: Kyle Mejia, male   DOB: Apr 03, 1956, 62 y.o.   MRN: [id number] No change in discharge status, discharge to SNF

## 2018-02-22 NOTE — Progress Notes (Signed)
OT Cancellation Note  Patient Details Name: Kyle Mejia MRN: [id number] DOB: 07-Jul-1956   Cancelled Treatment:    Reason Eval/Treat Not Completed: Patient declined, no reason specified(reports in pain) OT offered toileting, bed repositioning and OOB to chair. Pt declines all mobility. Ot helped patient facilitate calling RN with call bell for medication management. OT to attempt at later date/ time.  Vonita Moss   OTR/L Pager: [phone] Office: [phone] .  02/22/2018, 11:24 AM

## 2018-02-22 NOTE — Social Work (Signed)
CSW awaiting for update disposition for patient to return to assisted living for rehab and care.  Elissa Hefty, LCSW Clinical Social Worker [phone]

## 2018-02-22 NOTE — Discharge Summary (Signed)
Discharge Diagnoses:  Active Problems:   Total knee replacement status, left   Unilateral primary osteoarthritis, left knee   Surgeries: Procedure(s): LEFT TOTAL KNEE ARTHROPLASTY on 02/15/2018    Consultants:   Discharged Condition: Improved  Hospital Course: KEYMARION BEARMAN is an 62 y.o. male who was admitted 02/15/2018 with a chief complaint of osteoarthritis left knee, with a final diagnosis of TRAUMATIC OA LEFT KNEE.  Patient was brought to the operating room on 02/15/2018 and underwent Procedure(s): LEFT TOTAL KNEE ARTHROPLASTY.    Patient was given perioperative antibiotics:  Anti-infectives (From admission, onward)   Start     Dose/Rate Route Frequency Ordered Stop   02/15/18 1330  ceFAZolin (ANCEF) IVPB 1 g/50 mL premix     1 g 100 mL/hr over 30 Minutes Intravenous Every 6 hours 02/15/18 1238 02/15/18 2157   02/15/18 0556  ceFAZolin (ANCEF) 2-4 GM/100ML-% IVPB    Note to Pharmacy:  Starleen Arms   : cabinet override      02/15/18 0556 02/15/18 1759   02/15/18 0537  ceFAZolin (ANCEF) IVPB 2g/100 mL premix     2 g 200 mL/hr over 30 Minutes Intravenous On call to O.R. 02/15/18 2703 02/15/18 0751    .  Patient was given sequential compression devices, early ambulation, and aspirin for DVT prophylaxis.  Recent vital signs:  Patient Vitals for the past 24 hrs:  BP Temp Temp src Pulse Resp SpO2  02/22/18 0549 119/71 98.9 F (37.2 C) Oral 68 16 98 %  02/21/18 1900 110/60 99.1 F (37.3 C) Oral 71 16 99 %  02/21/18 1825 (!) 92/51 - - 62 - -  02/21/18 1633 (!) 81/55 - - - - -  .  Recent laboratory studies: No results found.  Discharge Medications:   Allergies as of 02/22/2018   No Known Allergies     Medication List    TAKE these medications   albuterol 108 (90 Base) MCG/ACT inhaler Commonly known as:  PROVENTIL HFA;VENTOLIN HFA Inhale 2 puffs into the lungs every 6 (six) hours as needed for wheezing or shortness of breath.   cholestyramine 4 g  packet Commonly known as:  QUESTRAN Take 4 g by mouth every morning. Mix in 2-6 oz. Of water or juice and drink once daily   clonazePAM 0.5 MG tablet Commonly known as:  KLONOPIN TAKE 0.5MG  BY twice per day What changed:    how much to take  how to take this  when to take this  additional instructions   cloNIDine 0.2 MG tablet Commonly known as:  CATAPRES Take 1 tablet (0.2 mg total) by mouth 2 (two) times daily. What changed:  additional instructions   dextromethorphan-guaiFENesin 30-600 MG 12hr tablet Commonly known as:  MUCINEX DM Take 1 tablet by mouth 2 (two) times daily.   dicyclomine 20 MG tablet Commonly known as:  BENTYL Take 1 tablet (20 mg total) by mouth 4 (four) times daily -  before meals and at bedtime for 10 days.   escitalopram 10 MG tablet Commonly known as:  LEXAPRO Take 1 tablet (10 mg total) by mouth at bedtime.   folic acid 1 MG tablet Commonly known as:  FOLVITE Take 1 tablet (1 mg total) by mouth daily.   hydrOXYzine 50 MG tablet Commonly known as:  ATARAX/VISTARIL Take 4 tablets (200 mg total) by mouth at bedtime. What changed:  additional instructions   lisinopril 10 MG tablet Commonly known as:  PRINIVIL,ZESTRIL ONE TABLET BY MOUTH EACH DAY What changed:  how much to take  how to take this  when to take this  additional instructions   loperamide 2 MG capsule Commonly known as:  IMODIUM Take 2 tablets after first loose stool, then 1 tablet after each subsequent loose stool, maximum 8 tablets per day What changed:    how much to take  how to take this  when to take this  reasons to take this  additional instructions   Magnesium 400 MG Tabs Take 400 mg by mouth 2 (two) times daily. For treatment of hypomagnesia   metoprolol succinate 25 MG 24 hr tablet Commonly known as:  TOPROL-XL Take 1 tablet (25 mg total) by mouth daily. What changed:  additional instructions   NEXAVAR 200 MG tablet Generic drug:   SORAfenib TAKE 2 TABLETS (400MG ) BY MOUTH TWO TIMES DAILY. GIVE ON AN EMPTY STOMACH 1 HOUR BEFORE OR 2 HOURS AFTER MEALS.   omeprazole 20 MG capsule Commonly known as:  PRILOSEC Take 20 mg by mouth daily.   oxyCODONE-acetaminophen 5-325 MG tablet Commonly known as:  PERCOCET/ROXICET Take 1 tablet by mouth every 4 (four) hours as needed for severe pain.   PARoxetine 20 MG tablet Commonly known as:  PAXIL Take 1 tablet (20 mg total) by mouth daily. What changed:  additional instructions   potassium chloride SA 20 MEQ tablet Commonly known as:  K-DUR,KLOR-CON Take 1 tablet (20 mEq total) by mouth daily.            Discharge Care Instructions  (From admission, onward)        Start     Ordered   02/20/18 0000  Change dressing    Comments:  Change dressing as needed okay to get the incision wet.   02/20/18 1256      Diagnostic Studies: No results found.  Patient benefited maximally from their hospital stay and there were no complications.     Disposition: Discharge disposition: 03-Skilled Nursing Facility      Discharge Instructions    Call MD / Call 911   Complete by:  As directed    If you experience chest pain or shortness of breath, CALL 911 and be transported to the hospital emergency room.  If you develope a fever above 101 F, pus (white drainage) or increased drainage or redness at the wound, or calf pain, call your surgeon's office.   Call MD / Call 911   Complete by:  As directed    If you experience chest pain or shortness of breath, CALL 911 and be transported to the hospital emergency room.  If you develope a fever above 101 F, pus (white drainage) or increased drainage or redness at the wound, or calf pain, call your surgeon's office.   Call MD / Call 911   Complete by:  As directed    If you experience chest pain or shortness of breath, CALL 911 and be transported to the hospital emergency room.  If you develope a fever above 101 F, pus (white  drainage) or increased drainage or redness at the wound, or calf pain, call your surgeon's office.   Call MD / Call 911   Complete by:  As directed    If you experience chest pain or shortness of breath, CALL 911 and be transported to the hospital emergency room.  If you develope a fever above 101 F, pus (white drainage) or increased drainage or redness at the wound, or calf pain, call your surgeon's office.   Call MD /  Call 911   Complete by:  As directed    If you experience chest pain or shortness of breath, CALL 911 and be transported to the hospital emergency room.  If you develope a fever above 101 F, pus (white drainage) or increased drainage or redness at the wound, or calf pain, call your surgeon's office.   Change dressing   Complete by:  As directed    Change dressing as needed okay to get the incision wet.   Constipation Prevention   Complete by:  As directed    Drink plenty of fluids.  Prune juice may be helpful.  You may use a stool softener, such as Colace (over the counter) 100 mg twice a day.  Use MiraLax (over the counter) for constipation as needed.   Constipation Prevention   Complete by:  As directed    Drink plenty of fluids.  Prune juice may be helpful.  You may use a stool softener, such as Colace (over the counter) 100 mg twice a day.  Use MiraLax (over the counter) for constipation as needed.   Constipation Prevention   Complete by:  As directed    Drink plenty of fluids.  Prune juice may be helpful.  You may use a stool softener, such as Colace (over the counter) 100 mg twice a day.  Use MiraLax (over the counter) for constipation as needed.   Constipation Prevention   Complete by:  As directed    Drink plenty of fluids.  Prune juice may be helpful.  You may use a stool softener, such as Colace (over the counter) 100 mg twice a day.  Use MiraLax (over the counter) for constipation as needed.   Constipation Prevention   Complete by:  As directed    Drink plenty of  fluids.  Prune juice may be helpful.  You may use a stool softener, such as Colace (over the counter) 100 mg twice a day.  Use MiraLax (over the counter) for constipation as needed.   Diet - low sodium heart healthy   Complete by:  As directed    Diet - low sodium heart healthy   Complete by:  As directed    Diet - low sodium heart healthy   Complete by:  As directed    Diet - low sodium heart healthy   Complete by:  As directed    Diet - low sodium heart healthy   Complete by:  As directed    Increase activity slowly as tolerated   Complete by:  As directed    Increase activity slowly as tolerated   Complete by:  As directed    Increase activity slowly as tolerated   Complete by:  As directed    Increase activity slowly as tolerated   Complete by:  As directed    Increase activity slowly as tolerated   Complete by:  As directed       Contact information for follow-up providers    Newt Minion, MD In 1 week.   Specialty:  Orthopedic Surgery Contact information: [place] [address] [phone]            Contact information for after-discharge care    Destination    [facility] .   Service:  Assisted Living Contact information: [place] [phone]                   Signed: Illene Regulus  Derak Schurman 02/22/2018, 11:26 AM

## 2018-02-22 NOTE — Social Work (Signed)
Clinical Social Worker facilitated patient discharge including contacting patient family and facility to confirm patient discharge plans.  Clinical information faxed to facility and family agreeable with plan.    CSW arranged ambulance transport via [facility] to [facility] .     RN to call [phone] ask for Lowella Grip to give report prior to discharge.  Clinical Social Worker will sign off for now as social work intervention is no longer needed. Please consult [place] again if new need arises.  Elissa Hefty, LCSW Clinical Social Worker [phone]

## 2018-02-22 NOTE — Progress Notes (Addendum)
Physical Therapy Treatment Patient Details Name: Kyle Mejia MRN: [id number] DOB: 03/21/1956 Today's Date: 02/22/2018    History of Present Illness Pt is a 62 y/o male s/p elective L TKA. PMH includes TBI, HTN, alcohol abuse, tobacco abuse, hepatitis C, hepatic cancer, osteopenia.     PT Comments    Patient is making progress toward mobility goals and tolerated ambulation well without c/o increased pain. Pt fatigued end of session and sitting OOB in recliner. Patient will need 24 hour assistance/supervision upon d/c.      Follow Up Recommendations  Follow surgeon's recommendation for DC plan and follow-up therapies;Supervision for mobility/OOB     Equipment Recommendations  None recommended by PT    Recommendations for Other Services OT consult     Precautions / Restrictions Precautions Precautions: Knee;Fall Precaution Comments: Reviwed knee precautions with pt Restrictions Weight Bearing Restrictions: Yes LLE Weight Bearing: Weight bearing as tolerated    Mobility  Bed Mobility Overal bed mobility: Modified Independent Bed Mobility: Supine to Sit           General bed mobility comments: increased time and effort  Transfers Overall transfer level: Needs assistance Equipment used: Rolling walker (2 wheeled) Transfers: Sit to/from Stand Sit to Stand: Min guard         General transfer comment: min guard for safety; carry over of safe hand placement demonstrated  Ambulation/Gait Ambulation/Gait assistance: Min assist Ambulation Distance (Feet): 90 Feet Assistive device: Rolling walker (2 wheeled) Gait Pattern/deviations: Step-to pattern;Decreased step length - right;Trunk flexed;Narrow base of support;Decreased dorsiflexion - left;Antalgic;Decreased stance time - left Gait velocity: decreased   General Gait Details: cues for posture, sequencing, and proximity to RW; assist to steady and for managing RW initially   Stairs            Wheelchair  Mobility    Modified Rankin (Stroke Patients Only)       Balance Overall balance assessment: Needs assistance;History of Falls Sitting-balance support: No upper extremity supported;Feet supported Sitting balance-Leahy Scale: Good     Standing balance support: Bilateral upper extremity supported;No upper extremity supported Standing balance-Leahy Scale: Poor Standing balance comment: Reliant on BUE support.                             Cognition Arousal/Alertness: Awake/alert Behavior During Therapy: WFL for tasks assessed/performed Overall Cognitive Status: History of cognitive impairments - at baseline                                        Exercises      General Comments General comments (skin integrity, edema, etc.): pt had bowel movement in bed and reported he did not know he had a bowel movement; total A for pericare in standing      Pertinent Vitals/Pain Pain Assessment: Faces Faces Pain Scale: Hurts little more Pain Location: L knee  Pain Descriptors / Indicators: Sore;Guarding Pain Intervention(s): Limited activity within patient's tolerance;Monitored during session;Premedicated before session;Repositioned    Home Living                      Prior Function            PT Goals (current goals can now be found in the care plan section) Acute Rehab PT Goals PT Goal Formulation: With patient Time For Goal Achievement: 03/01/18 Potential to  Achieve Goals: Good Progress towards PT goals: Progressing toward goals    Frequency    7X/week      PT Plan Current plan remains appropriate    Co-evaluation              AM-PAC PT "6 Clicks" Daily Activity  Outcome Measure  Difficulty turning over in bed (including adjusting bedclothes, sheets and blankets)?: A Little Difficulty moving from lying on back to sitting on the side of the bed? : A Lot Difficulty sitting down on and standing up from a chair with arms (e.g.,  wheelchair, bedside commode, etc,.)?: Unable Help needed moving to and from a bed to chair (including a wheelchair)?: A Little Help needed walking in hospital room?: A Little Help needed climbing 3-5 steps with a railing? : A Lot 6 Click Score: 14    End of Session Equipment Utilized During Treatment: Gait belt Activity Tolerance: Patient tolerated treatment well Patient left: in chair;with call bell/phone within reach;with chair alarm set Nurse Communication: Mobility status PT Visit Diagnosis: Unsteadiness on feet (R26.81);Other abnormalities of gait and mobility (R26.89);History of falling (Z91.81);Pain Pain - Right/Left: Left Pain - part of body: Knee     Time: 1152-1220 PT Time Calculation (min) (ACUTE ONLY): 28 min  Charges:  $Gait Training: 8-22 mins $Therapeutic Activity: 8-22 mins                    G Codes:       Earney Navy, PTA Pager: [phone]     Darliss Cheney 02/22/2018, 2:26 PM

## 2018-02-22 NOTE — Clinical Social Work Placement (Signed)
   CLINICAL SOCIAL WORK PLACEMENT  NOTE  Date:  02/22/2018  Patient Details  Name: Kyle Mejia MRN: [id number] Date of Birth: [birth year]  Clinical Social Work is seeking post-discharge placement for this patient at the [place] level of care (*CSW will initial, date and re-position this form in  chart as items are completed):  Yes   Patient/family provided with [facility] Work Department's list of facilities offering this level of care within the geographic area requested by the patient (or if unable, by the patient's family).  Yes   Patient/family informed of their freedom to choose among providers that offer the needed level of care, that participate in Medicare, Medicaid or managed care program needed by the patient, have an available bed and are willing to accept the patient.  Yes   Patient/family informed of [facility]'s ownership interest in [facility] and [facility], as well as of the fact that they are under no obligation to receive care at these facilities.  PASRR submitted to EDS on       PASRR number received on 02/17/18     Existing PASRR number confirmed on       FL2 transmitted to all facilities in geographic area requested by pt/family on 02/17/18     FL2 transmitted to all facilities within larger geographic area on       Patient informed that his/her managed care company has contracts with or will negotiate with certain facilities, including the following:        Yes   Patient/family informed of bed offers received.  Patient chooses bed at [facility])     Physician recommends and patient chooses bed at      Patient to be transferred to [facility]) on 02/21/18.  Patient to be transferred to facility by [facility]     Patient family notified on 02/21/18 of transfer.  Name of family member notified:  Vaughan Basta contacted     PHYSICIAN       Additional Comment:     _______________________________________________ Normajean Baxter, LCSW 02/22/2018, 11:26 AM

## 2018-03-01 ENCOUNTER — Ambulatory Visit (INDEPENDENT_AMBULATORY_CARE_PROVIDER_SITE_OTHER): Payer: Medicaid Other | Admitting: Orthopedic Surgery

## 2018-03-01 ENCOUNTER — Ambulatory Visit (INDEPENDENT_AMBULATORY_CARE_PROVIDER_SITE_OTHER): Payer: Medicaid Other

## 2018-03-01 ENCOUNTER — Encounter (INDEPENDENT_AMBULATORY_CARE_PROVIDER_SITE_OTHER): Payer: Self-pay | Admitting: Orthopedic Surgery

## 2018-03-01 VITALS — Ht 67.0 in | Wt 146.0 lb

## 2018-03-01 DIAGNOSIS — M1712 Unilateral primary osteoarthritis, left knee: Secondary | ICD-10-CM

## 2018-03-01 DIAGNOSIS — M25562 Pain in left knee: Secondary | ICD-10-CM

## 2018-03-01 DIAGNOSIS — G8929 Other chronic pain: Secondary | ICD-10-CM

## 2018-03-01 MED ORDER — PENTOXIFYLLINE ER 400 MG PO TBCR: 400.0000 mg | EXTENDED_RELEASE_TABLET | Freq: Three times a day (TID) | ORAL | 3 refills | Status: AC

## 2018-03-01 MED ORDER — MUPIROCIN 2 % EX OINT: 1.0000 "application " | TOPICAL_OINTMENT | Freq: Two times a day (BID) | CUTANEOUS | 3 refills | Status: DC

## 2018-03-01 MED ORDER — NITROGLYCERIN 0.2 MG/HR TD PT24: 0.2000 mg | MEDICATED_PATCH | Freq: Every day | TRANSDERMAL | 12 refills | Status: DC
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
## 2018-03-06 ENCOUNTER — Inpatient Hospital Stay (HOSPITAL_BASED_OUTPATIENT_CLINIC_OR_DEPARTMENT_OTHER): Payer: Medicaid Other | Admitting: Oncology

## 2018-03-06 ENCOUNTER — Inpatient Hospital Stay: Payer: Medicaid Other | Attending: Nurse Practitioner

## 2018-03-06 VITALS — BP 111/56 | HR 63 | Temp 97.9°F | Resp 17 | Ht 67.0 in | Wt 142.4 lb

## 2018-03-06 DIAGNOSIS — B1920 Unspecified viral hepatitis C without hepatic coma: Secondary | ICD-10-CM

## 2018-03-06 DIAGNOSIS — Z87891 Personal history of nicotine dependence: Secondary | ICD-10-CM | POA: Insufficient documentation

## 2018-03-06 DIAGNOSIS — C220 Liver cell carcinoma: Secondary | ICD-10-CM

## 2018-03-06 DIAGNOSIS — Z79899 Other long term (current) drug therapy: Secondary | ICD-10-CM | POA: Diagnosis not present

## 2018-03-06 DIAGNOSIS — Z96652 Presence of left artificial knee joint: Secondary | ICD-10-CM | POA: Insufficient documentation

## 2018-03-06 DIAGNOSIS — F1020 Alcohol dependence, uncomplicated: Secondary | ICD-10-CM

## 2018-03-06 LAB — CBC WITH DIFFERENTIAL (CANCER CENTER ONLY)
Basophils Absolute: 0.1 10*3/uL (ref 0.0–0.1)
Basophils Relative: 1 %
Eosinophils Absolute: 0.2 10*3/uL (ref 0.0–0.5)
Eosinophils Relative: 3 %
HEMATOCRIT: 39.4 % (ref 38.4–49.9)
HEMOGLOBIN: 12.4 g/dL — AB (ref 13.0–17.1)
LYMPHS PCT: 34 %
Lymphs Abs: 2.4 10*3/uL (ref 0.9–3.3)
MCH: 30.4 pg (ref 27.2–33.4)
MCHC: 31.5 g/dL — AB (ref 32.0–36.0)
MCV: 96.6 fL (ref 79.3–98.0)
MONOS PCT: 11 %
Monocytes Absolute: 0.8 10*3/uL (ref 0.1–0.9)
NEUTROS ABS: 3.7 10*3/uL (ref 1.5–6.5)
NEUTROS PCT: 51 %
Platelet Count: 227 10*3/uL (ref 140–400)
RBC: 4.08 MIL/uL — ABNORMAL LOW (ref 4.20–5.82)
RDW: 14.6 % (ref 11.0–14.6)
WBC Count: 7.1 10*3/uL (ref 4.0–10.3)

## 2018-03-06 LAB — CMP (CANCER CENTER ONLY)
ALBUMIN: 3.1 g/dL — AB (ref 3.5–5.0)
ALT: 30 U/L (ref 0–55)
ANION GAP: 8 (ref 3–11)
AST: 32 U/L (ref 5–34)
Alkaline Phosphatase: 209 U/L — ABNORMAL HIGH (ref 40–150)
BUN: 15 mg/dL (ref 7–26)
CO2: 25 mmol/L (ref 22–29)
Calcium: 9.1 mg/dL (ref 8.4–10.4)
Chloride: 107 mmol/L (ref 98–109)
Creatinine: 0.92 mg/dL (ref 0.70–1.30)
GFR, Estimated: >60 mL/min (ref >60)
Glucose, Bld: 99 mg/dL (ref 70–140)
Potassium: 5.1 mmol/L (ref 3.5–5.1)
SODIUM: 140 mmol/L (ref 136–145)
Total Bilirubin: 1 mg/dL (ref 0.2–1.2)
Total Protein: 7.2 g/dL (ref 6.4–8.3)
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
## 2018-03-07 ENCOUNTER — Telehealth: Payer: Self-pay | Admitting: Oncology

## 2018-03-07 LAB — AFP TUMOR MARKER: AFP, Serum, Tumor Marker: 2440 ng/mL — ABNORMAL HIGH (ref 0.0–8.3)

## 2018-03-07 NOTE — Telephone Encounter (Signed)
Scheduled appt per  4/15 los - reminder letter sent in the m ail - central radiology to contact patient with ct.

## 2018-03-16 ENCOUNTER — Ambulatory Visit (INDEPENDENT_AMBULATORY_CARE_PROVIDER_SITE_OTHER): Payer: Medicaid Other | Admitting: Orthopedic Surgery

## 2018-03-16 ENCOUNTER — Encounter (INDEPENDENT_AMBULATORY_CARE_PROVIDER_SITE_OTHER): Payer: Self-pay | Admitting: Orthopedic Surgery

## 2018-03-16 DIAGNOSIS — Z96652 Presence of left artificial knee joint: Secondary | ICD-10-CM

## 2018-03-16 MED ORDER — MUPIROCIN 2 % EX OINT: 1.0000 "application " | TOPICAL_OINTMENT | Freq: Two times a day (BID) | CUTANEOUS | 3 refills | Status: DC

## 2018-03-16 MED ORDER — PENTOXIFYLLINE ER 400 MG PO TBCR: 400.0000 mg | EXTENDED_RELEASE_TABLET | Freq: Three times a day (TID) | ORAL | 3 refills | Status: DC
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
## 2018-03-17 MED FILL — NexAVAR 200 MG TABS: 200 | 30 days supply | Qty: 120 | Fill #1

## 2018-03-30 ENCOUNTER — Encounter (INDEPENDENT_AMBULATORY_CARE_PROVIDER_SITE_OTHER): Payer: Self-pay | Admitting: Orthopedic Surgery

## 2018-03-30 ENCOUNTER — Ambulatory Visit (INDEPENDENT_AMBULATORY_CARE_PROVIDER_SITE_OTHER): Payer: Medicaid Other | Admitting: Orthopedic Surgery

## 2018-03-30 VITALS — Ht 67.0 in | Wt 142.0 lb

## 2018-03-30 DIAGNOSIS — Z96652 Presence of left artificial knee joint: Secondary | ICD-10-CM
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
## 2018-04-03 ENCOUNTER — Inpatient Hospital Stay: Payer: Medicaid Other | Attending: Nurse Practitioner

## 2018-04-03 DIAGNOSIS — C220 Liver cell carcinoma: Secondary | ICD-10-CM | POA: Diagnosis present

## 2018-04-03 DIAGNOSIS — F1020 Alcohol dependence, uncomplicated: Secondary | ICD-10-CM | POA: Diagnosis not present

## 2018-04-03 DIAGNOSIS — Z87891 Personal history of nicotine dependence: Secondary | ICD-10-CM | POA: Insufficient documentation

## 2018-04-03 DIAGNOSIS — Z79899 Other long term (current) drug therapy: Secondary | ICD-10-CM | POA: Diagnosis not present

## 2018-04-03 DIAGNOSIS — B1920 Unspecified viral hepatitis C without hepatic coma: Secondary | ICD-10-CM | POA: Insufficient documentation

## 2018-04-03 DIAGNOSIS — R197 Diarrhea, unspecified: Secondary | ICD-10-CM | POA: Insufficient documentation

## 2018-04-03 LAB — CBC WITH DIFFERENTIAL (CANCER CENTER ONLY)
BASOS PCT: 1 %
Basophils Absolute: 0.1 10*3/uL (ref 0.0–0.1)
EOS ABS: 0.2 10*3/uL (ref 0.0–0.5)
Eosinophils Relative: 3 %
HEMATOCRIT: 41.5 % (ref 38.4–49.9)
Hemoglobin: 13.5 g/dL (ref 13.0–17.1)
Lymphocytes Relative: 45 %
Lymphs Abs: 2.8 10*3/uL (ref 0.9–3.3)
MCH: 29.1 pg (ref 27.2–33.4)
MCHC: 32.5 g/dL (ref 32.0–36.0)
MCV: 89.5 fL (ref 79.3–98.0)
MONO ABS: 0.5 10*3/uL (ref 0.1–0.9)
MONOS PCT: 7 %
Neutro Abs: 2.7 10*3/uL (ref 1.5–6.5)
Neutrophils Relative %: 44 %
Platelet Count: 148 10*3/uL (ref 140–400)
RBC: 4.64 MIL/uL (ref 4.20–5.82)
RDW: 13.7 % (ref 11.0–14.6)
WBC Count: 6.2 10*3/uL (ref 4.0–10.3)

## 2018-04-03 LAB — CMP (CANCER CENTER ONLY)
ALBUMIN: 3.1 g/dL — AB (ref 3.5–5.0)
ALK PHOS: 162 U/L — AB (ref 40–150)
ALT: 23 U/L (ref 0–55)
AST: 23 U/L (ref 5–34)
Anion gap: 4 (ref 3–11)
BUN: 15 mg/dL (ref 7–26)
CALCIUM: 8.8 mg/dL (ref 8.4–10.4)
CO2: 28 mmol/L (ref 22–29)
CREATININE: 0.84 mg/dL (ref 0.70–1.30)
Chloride: 106 mmol/L (ref 98–109)
GFR, Est AFR Am: >60 mL/min (ref >60)
GFR, Estimated: >60 mL/min (ref >60)
GLUCOSE: 90 mg/dL (ref 70–140)
Potassium: 4.7 mmol/L (ref 3.5–5.1)
SODIUM: 138 mmol/L (ref 136–145)
Total Bilirubin: 0.6 mg/dL (ref 0.2–1.2)
Total Protein: 7 g/dL (ref 6.4–8.3)

## 2018-04-04 ENCOUNTER — Encounter: Payer: Self-pay | Admitting: Nurse Practitioner

## 2018-04-04 ENCOUNTER — Inpatient Hospital Stay (HOSPITAL_BASED_OUTPATIENT_CLINIC_OR_DEPARTMENT_OTHER): Payer: Medicaid Other | Admitting: Nurse Practitioner

## 2018-04-04 VITALS — BP 125/72 | HR 67 | Temp 98.0°F | Resp 18 | Ht 67.0 in | Wt 135.7 lb

## 2018-04-04 DIAGNOSIS — F1020 Alcohol dependence, uncomplicated: Secondary | ICD-10-CM

## 2018-04-04 DIAGNOSIS — R197 Diarrhea, unspecified: Secondary | ICD-10-CM

## 2018-04-04 DIAGNOSIS — C220 Liver cell carcinoma: Secondary | ICD-10-CM

## 2018-04-04 DIAGNOSIS — B1920 Unspecified viral hepatitis C without hepatic coma: Secondary | ICD-10-CM

## 2018-04-04 DIAGNOSIS — Z79899 Other long term (current) drug therapy: Secondary | ICD-10-CM | POA: Diagnosis not present

## 2018-04-04 DIAGNOSIS — Z87891 Personal history of nicotine dependence: Secondary | ICD-10-CM | POA: Diagnosis not present

## 2018-04-04 LAB — AFP TUMOR MARKER: AFP, Serum, Tumor Marker: 3034 ng/mL — ABNORMAL HIGH (ref 0.0–8.3)
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
## 2018-04-05 ENCOUNTER — Telehealth: Payer: Self-pay | Admitting: Nurse Practitioner

## 2018-04-05 NOTE — Telephone Encounter (Signed)
Scheduled appt per 5/14 los - patient wife to call to living facility.

## 2018-04-10 ENCOUNTER — Ambulatory Visit (HOSPITAL_COMMUNITY): Admission: RE | Admit: 2018-04-10 | Payer: Medicaid Other | Source: Ambulatory Visit

## 2018-04-12 ENCOUNTER — Encounter (INDEPENDENT_AMBULATORY_CARE_PROVIDER_SITE_OTHER): Payer: Self-pay | Admitting: Family

## 2018-04-12 ENCOUNTER — Telehealth: Payer: Self-pay

## 2018-04-12 ENCOUNTER — Ambulatory Visit (INDEPENDENT_AMBULATORY_CARE_PROVIDER_SITE_OTHER): Payer: Medicaid Other | Admitting: Family

## 2018-04-12 ENCOUNTER — Inpatient Hospital Stay: Payer: Medicaid Other | Admitting: Nurse Practitioner

## 2018-04-12 ENCOUNTER — Other Ambulatory Visit: Payer: Self-pay | Admitting: Oncology

## 2018-04-12 VITALS — Ht 67.0 in | Wt 135.0 lb

## 2018-04-12 DIAGNOSIS — Z96652 Presence of left artificial knee joint: Secondary | ICD-10-CM

## 2018-04-12 DIAGNOSIS — C220 Liver cell carcinoma: Secondary | ICD-10-CM

## 2018-04-12 DIAGNOSIS — M1712 Unilateral primary osteoarthritis, left knee: Secondary | ICD-10-CM

## 2018-04-12 MED ORDER — OXYCODONE-ACETAMINOPHEN 5-325 MG PO TABS: 1.0000 | ORAL_TABLET | Freq: Two times a day (BID) | ORAL | 0 refills | Status: DC | PRN
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
## 2018-04-12 NOTE — Telephone Encounter (Signed)
Spoke with Santiago Glad at [facility] regarding rescheduled appts for pt. CT rescheduled for 5/24 at 0730. Instructions given for pt to arrive at 0715 to [facility] radiology. Pt to be NPO 4hrs prior to appt. Pt to drink 1st bottle at 0530 and second bottle of contrast at 0630. Office visit rescheduled for 5/28 with Dr. Benay Spice at [facility], pt to arrive at 0900 for check in. Santiago Glad voiced understanding.

## 2018-04-12 NOTE — Telephone Encounter (Signed)
-----   Message from Owens Shark, NP sent at 04/12/2018 12:07 PM EDT ----- [name] was supposed to have CT scans prior to today's visit.  Does not look like this was scheduled.  Please follow-up.

## 2018-04-13 ENCOUNTER — Ambulatory Visit (INDEPENDENT_AMBULATORY_CARE_PROVIDER_SITE_OTHER): Payer: Medicaid Other | Admitting: Orthopedic Surgery

## 2018-04-14 ENCOUNTER — Ambulatory Visit (HOSPITAL_COMMUNITY)
Admission: RE | Admit: 2018-04-14 | Discharge: 2018-04-14 | Disposition: A | Discharge status: Home / Self Care | Payer: Medicaid Other | Source: Ambulatory Visit | Attending: Oncology | Admitting: Oncology

## 2018-04-14 DIAGNOSIS — M899 Disorder of bone, unspecified: Secondary | ICD-10-CM | POA: Insufficient documentation

## 2018-04-14 DIAGNOSIS — I81 Portal vein thrombosis: Secondary | ICD-10-CM | POA: Insufficient documentation

## 2018-04-14 DIAGNOSIS — C220 Liver cell carcinoma: Secondary | ICD-10-CM | POA: Diagnosis not present

## 2018-04-14 DIAGNOSIS — K7460 Unspecified cirrhosis of liver: Secondary | ICD-10-CM | POA: Diagnosis not present

## 2018-04-14 MED ORDER — IOPAMIDOL (ISOVUE-300) INJECTION 61%
INTRAVENOUS | Status: AC
  Filled 2018-04-14: qty 100

## 2018-04-14 MED ORDER — IOPAMIDOL (ISOVUE-300) INJECTION 61%
100.0000 mL | Freq: Once | INTRAVENOUS | Status: AC | PRN
  Administered 2018-04-14: 100 mL via INTRAVENOUS

## 2018-04-18 ENCOUNTER — Inpatient Hospital Stay: Payer: Medicaid Other | Admitting: Oncology

## 2018-04-19 MED FILL — NexAVAR 200 MG TABS: 200 | 30 days supply | Qty: 120 | Fill #0

## 2018-04-20 ENCOUNTER — Inpatient Hospital Stay: Payer: Medicaid Other | Admitting: Nurse Practitioner

## 2018-04-21 ENCOUNTER — Telehealth: Payer: Self-pay | Admitting: Oncology

## 2018-04-21 NOTE — Telephone Encounter (Signed)
Called living facility and they are aware of appt rescheduled from 5/30 to 6/4 . Spoke with Delia Chimes ,  She understood it was a r/s from a missed appt.  Also called [facility] to confirm appt date and time.

## 2018-04-25 ENCOUNTER — Inpatient Hospital Stay: Payer: Medicaid Other | Attending: Nurse Practitioner | Admitting: Oncology

## 2018-04-25 ENCOUNTER — Telehealth: Payer: Self-pay | Admitting: Oncology

## 2018-04-25 VITALS — BP 121/75 | HR 61 | Temp 98.7°F | Resp 17 | Ht 67.0 in | Wt 135.5 lb

## 2018-04-25 DIAGNOSIS — C220 Liver cell carcinoma: Secondary | ICD-10-CM | POA: Diagnosis not present

## 2018-04-25 DIAGNOSIS — Z79899 Other long term (current) drug therapy: Secondary | ICD-10-CM | POA: Insufficient documentation

## 2018-04-25 DIAGNOSIS — R7989 Other specified abnormal findings of blood chemistry: Secondary | ICD-10-CM | POA: Insufficient documentation

## 2018-04-25 NOTE — Telephone Encounter (Signed)
Appointments scheduled AVS/Calendar printed per 6/4 los °
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
## 2018-04-26 ENCOUNTER — Ambulatory Visit (INDEPENDENT_AMBULATORY_CARE_PROVIDER_SITE_OTHER): Payer: Medicaid Other | Admitting: Orthopedic Surgery

## 2018-04-27 ENCOUNTER — Ambulatory Visit (INDEPENDENT_AMBULATORY_CARE_PROVIDER_SITE_OTHER): Payer: Medicaid Other | Admitting: Orthopedic Surgery

## 2018-04-27 ENCOUNTER — Encounter (INDEPENDENT_AMBULATORY_CARE_PROVIDER_SITE_OTHER): Payer: Self-pay | Admitting: Orthopedic Surgery

## 2018-04-27 VITALS — Ht 67.0 in | Wt 135.0 lb

## 2018-04-27 DIAGNOSIS — Z96652 Presence of left artificial knee joint: Secondary | ICD-10-CM
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
## 2018-05-22 MED FILL — NexAVAR 200 MG TABS: 200 | 30 days supply | Qty: 120 | Fill #1

## 2018-05-26 ENCOUNTER — Inpatient Hospital Stay: Payer: Medicaid Other | Attending: Nurse Practitioner

## 2018-05-26 ENCOUNTER — Inpatient Hospital Stay (HOSPITAL_BASED_OUTPATIENT_CLINIC_OR_DEPARTMENT_OTHER): Payer: Medicaid Other | Admitting: Nurse Practitioner

## 2018-05-26 ENCOUNTER — Telehealth: Payer: Self-pay | Admitting: Oncology

## 2018-05-26 ENCOUNTER — Encounter: Payer: Self-pay | Admitting: Nurse Practitioner

## 2018-05-26 VITALS — BP 121/75 | HR 68 | Temp 98.1°F | Resp 17 | Ht 67.0 in | Wt 133.1 lb

## 2018-05-26 DIAGNOSIS — Z79899 Other long term (current) drug therapy: Secondary | ICD-10-CM | POA: Diagnosis not present

## 2018-05-26 DIAGNOSIS — C220 Liver cell carcinoma: Secondary | ICD-10-CM | POA: Diagnosis present

## 2018-05-26 DIAGNOSIS — R7989 Other specified abnormal findings of blood chemistry: Secondary | ICD-10-CM

## 2018-05-26 DIAGNOSIS — Z87820 Personal history of traumatic brain injury: Secondary | ICD-10-CM

## 2018-05-26 DIAGNOSIS — K8020 Calculus of gallbladder without cholecystitis without obstruction: Secondary | ICD-10-CM | POA: Insufficient documentation

## 2018-05-26 DIAGNOSIS — K0889 Other specified disorders of teeth and supporting structures: Secondary | ICD-10-CM | POA: Insufficient documentation

## 2018-05-26 LAB — COMPREHENSIVE METABOLIC PANEL
ALBUMIN: 3.1 g/dL — AB (ref 3.5–5.0)
ALK PHOS: 158 U/L — AB (ref 38–126)
ALT: 31 U/L (ref 0–44)
AST: 33 U/L (ref 15–41)
Anion gap: 8 (ref 5–15)
BILIRUBIN TOTAL: 0.7 mg/dL (ref 0.3–1.2)
BUN: 12 mg/dL (ref 8–23)
CO2: 30 mmol/L (ref 22–32)
CREATININE: 0.91 mg/dL (ref 0.61–1.24)
Calcium: 9.1 mg/dL (ref 8.9–10.3)
Chloride: 103 mmol/L (ref 98–111)
GFR calc Af Amer: >60 mL/min (ref >60)
GLUCOSE: 124 mg/dL — AB (ref 70–99)
Potassium: 4.4 mmol/L (ref 3.5–5.1)
Sodium: 141 mmol/L (ref 135–145)
TOTAL PROTEIN: 7.3 g/dL (ref 6.5–8.1)

## 2018-05-26 LAB — CBC WITH DIFFERENTIAL (CANCER CENTER ONLY)
Basophils Absolute: 0.1 10*3/uL (ref 0.0–0.1)
Basophils Relative: 1 %
EOS ABS: 0.1 10*3/uL (ref 0.0–0.5)
EOS PCT: 2 %
HCT: 46.4 % (ref 38.4–49.9)
Hemoglobin: 14.9 g/dL (ref 13.0–17.1)
LYMPHS ABS: 2.1 10*3/uL (ref 0.9–3.3)
Lymphocytes Relative: 25 %
MCH: 27.5 pg (ref 27.2–33.4)
MCHC: 32.2 g/dL (ref 32.0–36.0)
MCV: 85.2 fL (ref 79.3–98.0)
MONOS PCT: 8 %
Monocytes Absolute: 0.7 10*3/uL (ref 0.1–0.9)
Neutro Abs: 5.4 10*3/uL (ref 1.5–6.5)
Neutrophils Relative %: 64 %
Platelet Count: 127 10*3/uL — ABNORMAL LOW (ref 140–400)
RBC: 5.44 MIL/uL (ref 4.20–5.82)
RDW: 14.4 % (ref 11.0–14.6)
WBC: 8.4 10*3/uL (ref 4.0–10.3)

## 2018-05-26 NOTE — Telephone Encounter (Signed)
Appointments scheduled AVS/Calendar printed/  I called and had to leave a message for Dr Ritta Slot office for them to call patient to scheduled appointment per 7/5 los
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
## 2018-05-27 LAB — AFP TUMOR MARKER: AFP, Serum, Tumor Marker: 6488 ng/mL — ABNORMAL HIGH (ref 0.0–8.3)

## 2018-06-09 ENCOUNTER — Encounter (HOSPITAL_COMMUNITY): Payer: Self-pay | Admitting: Dentistry

## 2018-06-09 ENCOUNTER — Ambulatory Visit (HOSPITAL_COMMUNITY): Payer: Medicaid - Dental | Admitting: Dentistry

## 2018-06-09 VITALS — BP 150/89 | HR 56 | Temp 97.5°F

## 2018-06-09 DIAGNOSIS — K036 Deposits [accretions] on teeth: Secondary | ICD-10-CM

## 2018-06-09 DIAGNOSIS — K0889 Other specified disorders of teeth and supporting structures: Secondary | ICD-10-CM

## 2018-06-09 DIAGNOSIS — K083 Retained dental root: Secondary | ICD-10-CM

## 2018-06-09 DIAGNOSIS — K08409 Partial loss of teeth, unspecified cause, unspecified class: Secondary | ICD-10-CM

## 2018-06-09 DIAGNOSIS — K0530 Chronic periodontitis, unspecified: Secondary | ICD-10-CM

## 2018-06-09 DIAGNOSIS — M264 Malocclusion, unspecified: Secondary | ICD-10-CM

## 2018-06-09 DIAGNOSIS — K045 Chronic apical periodontitis: Secondary | ICD-10-CM

## 2018-06-09 DIAGNOSIS — K06020 Generalized gingival recession, unspecified: Secondary | ICD-10-CM

## 2018-06-09 DIAGNOSIS — Z96652 Presence of left artificial knee joint: Secondary | ICD-10-CM

## 2018-06-09 DIAGNOSIS — C220 Liver cell carcinoma: Secondary | ICD-10-CM

## 2018-06-09 DIAGNOSIS — K029 Dental caries, unspecified: Secondary | ICD-10-CM

## 2018-06-09 DIAGNOSIS — M270 Developmental disorders of jaws: Secondary | ICD-10-CM

## 2018-06-09 NOTE — Patient Instructions (Signed)
I am referring you to an oral surgeon, Dr. Diona Browner, for evaluation for extraction of remaining teeth with alveoloplasty and pre-prosthetic surgery as indicated. Dr. Lupita Leash office will contact you and arrange transportation through the assisted-living facility to discuss the plan of care.  Dr. Enrique Sack
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
## 2018-06-12 ENCOUNTER — Emergency Department (HOSPITAL_COMMUNITY): Payer: Medicaid Other

## 2018-06-12 ENCOUNTER — Emergency Department (HOSPITAL_COMMUNITY)
Admission: EM | Admit: 2018-06-12 | Discharge: 2018-06-13 | Disposition: A | Discharge status: Other Acute Inpatient Hospital | Payer: Medicaid Other | Attending: Emergency Medicine | Admitting: Emergency Medicine

## 2018-06-12 ENCOUNTER — Other Ambulatory Visit: Payer: Self-pay

## 2018-06-12 ENCOUNTER — Encounter (HOSPITAL_COMMUNITY): Payer: Self-pay

## 2018-06-12 DIAGNOSIS — Z96652 Presence of left artificial knee joint: Secondary | ICD-10-CM | POA: Insufficient documentation

## 2018-06-12 DIAGNOSIS — I10 Essential (primary) hypertension: Secondary | ICD-10-CM | POA: Diagnosis not present

## 2018-06-12 DIAGNOSIS — Z87820 Personal history of traumatic brain injury: Secondary | ICD-10-CM | POA: Diagnosis not present

## 2018-06-12 DIAGNOSIS — R110 Nausea: Secondary | ICD-10-CM | POA: Diagnosis not present

## 2018-06-12 DIAGNOSIS — N179 Acute kidney failure, unspecified: Secondary | ICD-10-CM | POA: Insufficient documentation

## 2018-06-12 DIAGNOSIS — Z9221 Personal history of antineoplastic chemotherapy: Secondary | ICD-10-CM | POA: Diagnosis not present

## 2018-06-12 DIAGNOSIS — R4182 Altered mental status, unspecified: Secondary | ICD-10-CM | POA: Diagnosis present

## 2018-06-12 DIAGNOSIS — Z8505 Personal history of malignant neoplasm of liver: Secondary | ICD-10-CM | POA: Insufficient documentation

## 2018-06-12 DIAGNOSIS — G9340 Encephalopathy, unspecified: Secondary | ICD-10-CM | POA: Insufficient documentation

## 2018-06-12 DIAGNOSIS — E860 Dehydration: Secondary | ICD-10-CM | POA: Diagnosis not present

## 2018-06-12 DIAGNOSIS — F17210 Nicotine dependence, cigarettes, uncomplicated: Secondary | ICD-10-CM | POA: Insufficient documentation

## 2018-06-12 DIAGNOSIS — Z79899 Other long term (current) drug therapy: Secondary | ICD-10-CM | POA: Diagnosis not present

## 2018-06-12 LAB — CBC WITH DIFFERENTIAL/PLATELET
ABS IMMATURE GRANULOCYTES: 0.1 10*3/uL (ref 0.0–0.1)
BASOS PCT: 1 %
Basophils Absolute: 0.1 10*3/uL (ref 0.0–0.1)
Eosinophils Absolute: 0.1 10*3/uL (ref 0.0–0.7)
Eosinophils Relative: 1 %
HCT: 43.1 % (ref 39.0–52.0)
Hemoglobin: 13.3 g/dL (ref 13.0–17.0)
Immature Granulocytes: 1 %
Lymphocytes Relative: 15 %
Lymphs Abs: 1.9 10*3/uL (ref 0.7–4.0)
MCH: 26.9 pg (ref 26.0–34.0)
MCHC: 30.9 g/dL (ref 30.0–36.0)
MCV: 87.1 fL (ref 78.0–100.0)
MONOS PCT: 8 %
Monocytes Absolute: 1 10*3/uL (ref 0.1–1.0)
NEUTROS ABS: 9.2 10*3/uL — AB (ref 1.7–7.7)
NEUTROS PCT: 74 %
PLATELETS: 112 10*3/uL — AB (ref 150–400)
RBC: 4.95 MIL/uL (ref 4.22–5.81)
RDW: 14.4 % (ref 11.5–15.5)
WBC: 12.2 10*3/uL — AB (ref 4.0–10.5)

## 2018-06-12 LAB — URINALYSIS, ROUTINE W REFLEX MICROSCOPIC
Glucose, UA: NEGATIVE mg/dL
HGB URINE DIPSTICK: NEGATIVE
Ketones, ur: NEGATIVE mg/dL
Leukocytes, UA: NEGATIVE
NITRITE: NEGATIVE
PROTEIN: NEGATIVE mg/dL
SPECIFIC GRAVITY, URINE: 1.026 (ref 1.005–1.030)
pH: 5 (ref 5.0–8.0)

## 2018-06-12 LAB — COMPREHENSIVE METABOLIC PANEL
ALT: 26 U/L (ref 0–44)
ANION GAP: 7 (ref 5–15)
AST: 37 U/L (ref 15–41)
Albumin: 2.7 g/dL — ABNORMAL LOW (ref 3.5–5.0)
Alkaline Phosphatase: 110 U/L (ref 38–126)
BUN: 23 mg/dL (ref 8–23)
CHLORIDE: 107 mmol/L (ref 98–111)
CO2: 26 mmol/L (ref 22–32)
CREATININE: 1.13 mg/dL (ref 0.61–1.24)
Calcium: 8.7 mg/dL — ABNORMAL LOW (ref 8.9–10.3)
Glucose, Bld: 151 mg/dL — ABNORMAL HIGH (ref 70–99)
POTASSIUM: 5 mmol/L (ref 3.5–5.1)
Sodium: 140 mmol/L (ref 135–145)
Total Bilirubin: 1.2 mg/dL (ref 0.3–1.2)
Total Protein: 6.1 g/dL — ABNORMAL LOW (ref 6.5–8.1)

## 2018-06-12 LAB — I-STAT CG4 LACTIC ACID, ED
LACTIC ACID, VENOUS: 1.75 mmol/L (ref 0.5–1.9)
Lactic Acid, Venous: 1.93 mmol/L — ABNORMAL HIGH (ref 0.5–1.9)

## 2018-06-12 LAB — PROTIME-INR
INR: 1.18
PROTHROMBIN TIME: 14.9 s (ref 11.4–15.2)

## 2018-06-12 LAB — AMMONIA: AMMONIA: 28 umol/L (ref 9–35)

## 2018-06-12 MED ORDER — SODIUM CHLORIDE 0.9 % IV BOLUS
1000.0000 mL | Freq: Once | INTRAVENOUS | Status: AC
  Administered 2018-06-12: 1000 mL via INTRAVENOUS

## 2018-06-12 NOTE — ED Notes (Addendum)
Pt offered sandwhich, decline

## 2018-06-12 NOTE — ED Triage Notes (Signed)
Pt arrives EMS from [place]. With c/o altered mental status. Per staff, Pt has been answering questions inappropriately. Since waking this am. Staff was unable to provide base line to EMS. Pt was able to stand but answering questions with inappropriate words. Alert to place and time. Hypotensive on scene 84/54. Given 500cc ns in route with improvement of symptoms. States he has frequent foul smelling urine per patient

## 2018-06-12 NOTE — Discharge Instructions (Addendum)
Please return for any problem.  Follow-up with your regular doctor as instructed.  Drink plenty of fluids. °

## 2018-06-12 NOTE — ED Provider Notes (Signed)
Patient seen after sign out.   Patient appears to be comfortable and without evidence significant delirium or confusion.  Work-up thus far has not revealed any acute process.  MRI brain does not reveal significant acute pathology.   Patient desires discharge.  He declines admission or observation to the Hospital.  Patient understands need for close follow-up.  Patient advised of all results - including MRI study - and need for close follow-up with his primary care providers.  Strict return precautions given and understood.    Valarie Merino, MD 06/12/18 2141

## 2018-06-12 NOTE — ED Notes (Signed)
Patient transported to X-ray 

## 2018-06-12 NOTE — ED Notes (Signed)
Pt removes IV at left [facility].

## 2018-06-12 NOTE — ED Notes (Signed)
Patient transported to MRI 

## 2018-06-12 NOTE — ED Notes (Signed)
[facility] called for transport.  

## 2018-06-12 NOTE — ED Notes (Signed)
Pt has removed second IV.
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
## 2018-06-13 ENCOUNTER — Other Ambulatory Visit: Payer: Self-pay | Admitting: Oncology

## 2018-06-13 DIAGNOSIS — C220 Liver cell carcinoma: Secondary | ICD-10-CM

## 2018-06-14 ENCOUNTER — Emergency Department (HOSPITAL_COMMUNITY)
Admission: EM | Admit: 2018-06-14 | Discharge: 2018-06-14 | Disposition: A | Discharge status: Home / Self Care | Payer: Medicaid Other | Attending: Emergency Medicine | Admitting: Emergency Medicine

## 2018-06-14 DIAGNOSIS — Z79899 Other long term (current) drug therapy: Secondary | ICD-10-CM | POA: Diagnosis not present

## 2018-06-14 DIAGNOSIS — I10 Essential (primary) hypertension: Secondary | ICD-10-CM | POA: Diagnosis not present

## 2018-06-14 DIAGNOSIS — F17210 Nicotine dependence, cigarettes, uncomplicated: Secondary | ICD-10-CM | POA: Diagnosis not present

## 2018-06-14 DIAGNOSIS — R443 Hallucinations, unspecified: Secondary | ICD-10-CM | POA: Diagnosis present

## 2018-06-14 DIAGNOSIS — Z8505 Personal history of malignant neoplasm of liver: Secondary | ICD-10-CM | POA: Diagnosis not present

## 2018-06-14 DIAGNOSIS — F419 Anxiety disorder, unspecified: Secondary | ICD-10-CM | POA: Insufficient documentation

## 2018-06-14 LAB — ETHANOL: Alcohol, Ethyl (B): <10 mg/dL (ref <10)

## 2018-06-14 LAB — COMPREHENSIVE METABOLIC PANEL
ALT: 32 U/L (ref 0–44)
AST: 31 U/L (ref 15–41)
Albumin: 3.1 g/dL — ABNORMAL LOW (ref 3.5–5.0)
Alkaline Phosphatase: 119 U/L (ref 38–126)
Anion gap: 11 (ref 5–15)
BUN: 19 mg/dL (ref 8–23)
CHLORIDE: 107 mmol/L (ref 98–111)
CO2: 27 mmol/L (ref 22–32)
CREATININE: 0.68 mg/dL (ref 0.61–1.24)
Calcium: 9.3 mg/dL (ref 8.9–10.3)
GFR calc Af Amer: >60 mL/min (ref >60)
GFR calc non Af Amer: >60 mL/min (ref >60)
Glucose, Bld: 89 mg/dL (ref 70–99)
POTASSIUM: 4.2 mmol/L (ref 3.5–5.1)
SODIUM: 145 mmol/L (ref 135–145)
Total Bilirubin: 1.5 mg/dL — ABNORMAL HIGH (ref 0.3–1.2)
Total Protein: 7.1 g/dL (ref 6.5–8.1)

## 2018-06-14 LAB — CBC WITH DIFFERENTIAL/PLATELET
BASOS ABS: 0 10*3/uL (ref 0.0–0.1)
BASOS PCT: 0 %
EOS ABS: 0 10*3/uL (ref 0.0–0.7)
EOS PCT: 0 %
HCT: 44.2 % (ref 39.0–52.0)
Hemoglobin: 14.2 g/dL (ref 13.0–17.0)
Lymphocytes Relative: 13 %
Lymphs Abs: 1.6 10*3/uL (ref 0.7–4.0)
MCH: 27.2 pg (ref 26.0–34.0)
MCHC: 32.1 g/dL (ref 30.0–36.0)
MCV: 84.5 fL (ref 78.0–100.0)
Monocytes Absolute: 1.2 10*3/uL — ABNORMAL HIGH (ref 0.1–1.0)
Monocytes Relative: 10 %
Neutro Abs: 9.2 10*3/uL — ABNORMAL HIGH (ref 1.7–7.7)
Neutrophils Relative %: 77 %
PLATELETS: 127 10*3/uL — AB (ref 150–400)
RBC: 5.23 MIL/uL (ref 4.22–5.81)
RDW: 14.4 % (ref 11.5–15.5)
WBC: 12 10*3/uL — AB (ref 4.0–10.5)

## 2018-06-14 LAB — AMMONIA: Ammonia: 17 umol/L (ref 9–35)

## 2018-06-14 MED ORDER — LORAZEPAM 0.5 MG PO TABS: ORAL_TABLET | ORAL | 0 refills | Status: DC

## 2018-06-14 MED ORDER — SODIUM CHLORIDE 0.9 % IV BOLUS
500.0000 mL | Freq: Once | INTRAVENOUS | Status: AC
  Administered 2018-06-14: 500 mL via INTRAVENOUS

## 2018-06-14 MED ORDER — LORAZEPAM 1 MG PO TABS
1.0000 mg | ORAL_TABLET | Freq: Once | ORAL | Status: AC
  Administered 2018-06-14: 1 mg via ORAL
  Filled 2018-06-14: qty 1
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
## 2018-06-14 NOTE — ED Notes (Signed)
Bed: VX42 Expected date:  Expected time:  Means of arrival:  Comments: EMS/"not acting right"

## 2018-06-14 NOTE — ED Triage Notes (Signed)
Transported by Corey Harold from [facility]; recently seen at [facility] on Monday for stroke rule out. Presents today with facial redness, eye redness, tremors and hallucinations. VSS. CBG read 62 mg/dl.

## 2018-06-14 NOTE — Discharge Instructions (Addendum)
Follow-up with your doctor next week if any problems 

## 2018-06-16 ENCOUNTER — Telehealth: Payer: Self-pay | Admitting: Family Medicine

## 2018-06-16 NOTE — Telephone Encounter (Signed)
Santiago Glad, RN, called from [facility] requesting an order for hospice or palliative care for pt.; states pt has been hospitalized 2 times recently and is experiencing ongoing weakness, delirium, and 'giving up'; requests a call back for follow up

## 2018-06-17 LAB — CULTURE, BLOOD (ROUTINE X 2)
Culture: NO GROWTH
Culture: NO GROWTH
Special Requests: ADEQUATE

## 2018-06-19 MED FILL — NexAVAR 200 MG TABS: 200 | 30 days supply | Qty: 120 | Fill #0

## 2018-06-22 ENCOUNTER — Inpatient Hospital Stay: Payer: Medicaid Other | Attending: Nurse Practitioner

## 2018-06-22 ENCOUNTER — Non-Acute Institutional Stay: Payer: Medicaid Other | Admitting: Nurse Practitioner

## 2018-06-22 ENCOUNTER — Telehealth: Payer: Self-pay | Admitting: Oncology

## 2018-06-22 ENCOUNTER — Inpatient Hospital Stay (HOSPITAL_BASED_OUTPATIENT_CLINIC_OR_DEPARTMENT_OTHER): Payer: Medicaid Other | Admitting: Oncology

## 2018-06-22 ENCOUNTER — Encounter: Payer: Self-pay | Admitting: Nurse Practitioner

## 2018-06-22 ENCOUNTER — Telehealth: Payer: Self-pay

## 2018-06-22 VITALS — BP 100/61 | HR 59 | Resp 18 | Ht 70.0 in | Wt 127.0 lb

## 2018-06-22 VITALS — BP 100/61 | HR 59 | Temp 97.9°F | Resp 18 | Ht 70.0 in | Wt 127.7 lb

## 2018-06-22 DIAGNOSIS — Z515 Encounter for palliative care: Secondary | ICD-10-CM

## 2018-06-22 DIAGNOSIS — R634 Abnormal weight loss: Secondary | ICD-10-CM

## 2018-06-22 DIAGNOSIS — F419 Anxiety disorder, unspecified: Secondary | ICD-10-CM | POA: Insufficient documentation

## 2018-06-22 DIAGNOSIS — C220 Liver cell carcinoma: Secondary | ICD-10-CM

## 2018-06-22 DIAGNOSIS — Z79899 Other long term (current) drug therapy: Secondary | ICD-10-CM | POA: Insufficient documentation

## 2018-06-22 DIAGNOSIS — R531 Weakness: Secondary | ICD-10-CM

## 2018-06-22 DIAGNOSIS — R4182 Altered mental status, unspecified: Secondary | ICD-10-CM

## 2018-06-22 DIAGNOSIS — Z87891 Personal history of nicotine dependence: Secondary | ICD-10-CM

## 2018-06-22 DIAGNOSIS — Z716 Tobacco abuse counseling: Secondary | ICD-10-CM

## 2018-06-22 DIAGNOSIS — F339 Major depressive disorder, recurrent, unspecified: Secondary | ICD-10-CM

## 2018-06-22 LAB — CMP (CANCER CENTER ONLY)
ALK PHOS: 128 U/L — AB (ref 38–126)
ALT: 23 U/L (ref 0–44)
ANION GAP: 6 (ref 5–15)
AST: 24 U/L (ref 15–41)
Albumin: 2.9 g/dL — ABNORMAL LOW (ref 3.5–5.0)
BUN: 12 mg/dL (ref 8–23)
CO2: 32 mmol/L (ref 22–32)
Calcium: 9 mg/dL (ref 8.9–10.3)
Chloride: 102 mmol/L (ref 98–111)
Creatinine: 0.76 mg/dL (ref 0.61–1.24)
GFR, Estimated: >60 mL/min (ref >60)
Glucose, Bld: 91 mg/dL (ref 70–99)
POTASSIUM: 4.5 mmol/L (ref 3.5–5.1)
SODIUM: 140 mmol/L (ref 135–145)
Total Bilirubin: 0.7 mg/dL (ref 0.3–1.2)
Total Protein: 6.7 g/dL (ref 6.5–8.1)

## 2018-06-22 LAB — CBC WITH DIFFERENTIAL (CANCER CENTER ONLY)
Basophils Absolute: 0 10*3/uL (ref 0.0–0.1)
Basophils Relative: 0 %
Eosinophils Absolute: 0.1 10*3/uL (ref 0.0–0.5)
Eosinophils Relative: 2 %
HEMATOCRIT: 41.2 % (ref 38.4–49.9)
Hemoglobin: 13.2 g/dL (ref 13.0–17.1)
Lymphocytes Relative: 28 %
Lymphs Abs: 2 10*3/uL (ref 0.9–3.3)
MCH: 26.7 pg — ABNORMAL LOW (ref 27.2–33.4)
MCHC: 32 g/dL (ref 32.0–36.0)
MCV: 83.2 fL (ref 79.3–98.0)
MONOS PCT: 12 %
Monocytes Absolute: 0.9 10*3/uL (ref 0.1–0.9)
NEUTROS ABS: 4 10*3/uL (ref 1.5–6.5)
NEUTROS PCT: 58 %
Platelet Count: 122 10*3/uL — ABNORMAL LOW (ref 140–400)
RBC: 4.95 MIL/uL (ref 4.20–5.82)
RDW: 15.1 % — AB (ref 11.0–14.6)
WBC Count: 6.9 10*3/uL (ref 4.0–10.3)
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
## 2018-06-22 NOTE — Telephone Encounter (Signed)
Scheduled appt per 8/1 los- pt spouse aware of appts set up and [facility] - living facility also aware of appts - appts taken by Delia Chimes,
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
## 2018-06-22 NOTE — Telephone Encounter (Signed)
Spoke with [name] with [facility] regarding confusion with Nexavar prescription in the pt facility. She also called to inform that pt has been admitted to Palliative care per order from our office. This RN voiced understanding and spoke to facility Market researcher at [facility] regarding [name]. This RN confirmed that per last MD note, pt will continue Nexavar prescription. Medication details and last OV note faxed to facility for pharmacy to add medication to [facility].

## 2018-06-23 LAB — AFP TUMOR MARKER: AFP, SERUM, TUMOR MARKER: 10364 ng/mL — AB (ref 0.0–8.3)

## 2018-06-27 ENCOUNTER — Other Ambulatory Visit: Payer: Self-pay | Admitting: Family Medicine

## 2018-06-27 ENCOUNTER — Ambulatory Visit (INDEPENDENT_AMBULATORY_CARE_PROVIDER_SITE_OTHER): Payer: Medicaid Other | Admitting: Family Medicine

## 2018-06-27 ENCOUNTER — Encounter: Payer: Self-pay | Admitting: Family Medicine

## 2018-06-27 VITALS — BP 100/64 | HR 80 | Temp 98.4°F | Ht 70.0 in | Wt 124.0 lb

## 2018-06-27 DIAGNOSIS — Z09 Encounter for follow-up examination after completed treatment for conditions other than malignant neoplasm: Secondary | ICD-10-CM

## 2018-06-27 DIAGNOSIS — R634 Abnormal weight loss: Secondary | ICD-10-CM

## 2018-06-27 DIAGNOSIS — G8929 Other chronic pain: Secondary | ICD-10-CM

## 2018-06-27 DIAGNOSIS — R0602 Shortness of breath: Secondary | ICD-10-CM

## 2018-06-27 DIAGNOSIS — E860 Dehydration: Secondary | ICD-10-CM | POA: Diagnosis not present

## 2018-06-27 DIAGNOSIS — Z131 Encounter for screening for diabetes mellitus: Secondary | ICD-10-CM

## 2018-06-27 DIAGNOSIS — Z716 Tobacco abuse counseling: Secondary | ICD-10-CM

## 2018-06-27 DIAGNOSIS — M25562 Pain in left knee: Secondary | ICD-10-CM

## 2018-06-27 DIAGNOSIS — F17200 Nicotine dependence, unspecified, uncomplicated: Secondary | ICD-10-CM

## 2018-06-27 DIAGNOSIS — Z0000 Encounter for general adult medical examination without abnormal findings: Secondary | ICD-10-CM

## 2018-06-27 LAB — POCT URINALYSIS DIP (MANUAL ENTRY)
Blood, UA: NEGATIVE
Glucose, UA: NEGATIVE mg/dL
Ketones, POC UA: NEGATIVE mg/dL
Leukocytes, UA: NEGATIVE
Nitrite, UA: NEGATIVE
Spec Grav, UA: >=1.03 — AB (ref 1.010–1.025)
Urobilinogen, UA: 0.2 E.U./dL
pH, UA: 5 (ref 5.0–8.0)

## 2018-06-27 LAB — POCT GLYCOSYLATED HEMOGLOBIN (HGB A1C): Hemoglobin A1C: 5.2 % (ref 4.0–5.6)

## 2018-06-27 MED ORDER — ENSURE COMPLETE PO LIQD: 237.0000 mL | Freq: Three times a day (TID) | ORAL | 12 refills | Status: AC

## 2018-06-27 NOTE — Telephone Encounter (Signed)
Script sent  

## 2018-06-27 NOTE — Telephone Encounter (Signed)
Please send written order for Hospice Referral, to facility @ attention:  Santiago Glad, RN @ [facility]   Fax # [phone]  Thank you!

## 2018-06-27 NOTE — Progress Notes (Signed)
Follow Up  Subjective:    Patient ID: Kyle Mejia, male    DOB: Jan 31, 1956, 62 y.o.   MRN: [id number]   Chief Complaint  Patient presents with  . Follow-up    6 month on chronic condition   HPI  Kyle Mejia has a past medical history of Tobacco Dependence, Osteopenia, Osteoarthritis, Neuropathy, Hypertension, Hepatitis C, Depression, Chronic Left Knee Pain, Chronic Left Shoulder Pain, and Alcoholism. He is here today for follow up.   Current Status: He currently resides in [place] on [address], where he receives Retail banker. He has mild back pain today.He currently smokes 1 pack cigarettes in a week. Uses a cane or walker for ambulation, for chronic left knee pain.   Since his last office visit, he has requested to be referred to [facility]. Rx for Hospice Care was sent to the facility where he resides, on yesterday. Today he is unsure if he now wants Hospice Care.   He denies fevers, chills, fatigue, recent infections, weight loss, and night sweats. He has not had any headaches, visual changes, dizziness, and falls. No chest pain, heart palpitations, cough and shortness of breath reported. No reports of GI problems such as nausea, vomiting, diarrhea, and constipation. H has no reports of blood in stools, dysuria and hematuria. His anxiety is mild today. He denies pain today.    Review of Systems  Constitutional: Negative.   HENT: Negative.   Eyes: Negative.   Respiratory: Negative.   Cardiovascular: Negative.   Gastrointestinal: Positive for nausea.  Endocrine: Negative.   Genitourinary: Positive for decreased urine volume.  Musculoskeletal: Positive for arthralgias (Chronic Left Knee) and back pain (Chronic ).  Skin: Negative.   Allergic/Immunologic: Negative.   Neurological: Negative.   Hematological: Negative.   Psychiatric/Behavioral: Negative.    Objective:   Physical Exam  Constitutional: He is oriented to person, place, and time. He appears  well-developed and well-nourished.  HENT:  Head: Normocephalic.  Right Ear: External ear normal.  Left Ear: External ear normal.  Nose: Nose normal.  Mouth/Throat: Oropharynx is clear and moist.  Eyes: Pupils are equal, round, and reactive to light. Conjunctivae and EOM are normal.  Neck: Normal range of motion. Neck supple.  Cardiovascular: Normal rate, regular rhythm, normal heart sounds and intact distal pulses.  Pulmonary/Chest: Breath sounds normal.  Abdominal: Soft. Bowel sounds are normal.  Musculoskeletal: Normal range of motion.  Limited ROM in back and left knee  Neurological: He is alert and oriented to person, place, and time.  Skin: Skin is warm and dry. Capillary refill takes less than 2 seconds.  Psychiatric: He has a normal mood and affect. His behavior is normal. Judgment and thought content normal.  Nursing note and vitals reviewed.  Assessment & Plan:   1. Chronic pain of left knee He will continue Percocet as needed, given by facility. He will have nurse to contact office if his pain increases.   2. Weight loss He has an 18 lb weight loss in 4 months. We will add Ensure for supplemental nutrition today. We will continue to monitor.  - feeding supplement, ENSURE COMPLETE, (ENSURE COMPLETE) LIQD; Take 237 mLs by mouth 3 (three) times daily between meals.  Dispense: 90 Bottle; Refill: 12  3. Dehydration He will continue to increase fluids.  4. SOB (shortness of breath) Stable. No respiratory distress noted.   5. Tobacco dependence He continues to smoke 1 pack a week.   6. Encounter for  smoking cessation  counseling Discussed benefits of quitting smoking. He will contact office when he is ready to quit smoking.   7. Screening for diabetes mellitus Hgb A1c is normal at 5.2 today. He will continue to decrease foods/beverages high in sugars and carbs and follow Heart Healthy or DASH diet. Increase physical activity to at least 30 minutes cardio exercise daily.    - POCT glycosylated hemoglobin (Hb A1C) - POCT urinalysis dipstick  8. Follow up He will follow up in 3 months.   Meds ordered this encounter  Medications  . feeding supplement, ENSURE COMPLETE, (ENSURE COMPLETE) LIQD    Sig: Take 237 mLs by mouth 3 (three) times daily between meals.    Dispense:  90 Bottle    Refill:  [place],  MSN, [facility] [address] [place], [place] [address] [phone]

## 2018-06-29 ENCOUNTER — Encounter (INDEPENDENT_AMBULATORY_CARE_PROVIDER_SITE_OTHER): Payer: Self-pay | Admitting: Orthopedic Surgery

## 2018-06-29 ENCOUNTER — Ambulatory Visit (INDEPENDENT_AMBULATORY_CARE_PROVIDER_SITE_OTHER): Payer: Medicaid Other | Admitting: Orthopedic Surgery

## 2018-06-29 VITALS — Ht 70.0 in | Wt 124.0 lb

## 2018-06-29 DIAGNOSIS — Z96652 Presence of left artificial knee joint: Secondary | ICD-10-CM | POA: Diagnosis not present
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
## 2018-07-05 ENCOUNTER — Telehealth (INDEPENDENT_AMBULATORY_CARE_PROVIDER_SITE_OTHER): Payer: Self-pay

## 2018-07-05 NOTE — Telephone Encounter (Signed)
Kyle Mejia with [facility] at [facility] wanted to let Dr. Sharol Given know that HHPT will start with patient on Friday, 07/07/2018.

## 2018-07-17 ENCOUNTER — Ambulatory Visit (HOSPITAL_COMMUNITY)
Admission: RE | Admit: 2018-07-17 | Discharge: 2018-07-17 | Disposition: A | Discharge status: Home / Self Care | Payer: Medicaid Other | Source: Ambulatory Visit | Attending: Oncology | Admitting: Oncology

## 2018-07-17 ENCOUNTER — Encounter (HOSPITAL_COMMUNITY): Payer: Self-pay

## 2018-07-17 ENCOUNTER — Inpatient Hospital Stay: Payer: Medicaid Other

## 2018-07-17 DIAGNOSIS — C220 Liver cell carcinoma: Secondary | ICD-10-CM

## 2018-07-17 LAB — CBC WITH DIFFERENTIAL (CANCER CENTER ONLY)
Basophils Absolute: 0.1 10*3/uL (ref 0.0–0.1)
Basophils Relative: 1 %
Eosinophils Absolute: 0.2 10*3/uL (ref 0.0–0.5)
Eosinophils Relative: 3 %
HEMATOCRIT: 41.9 % (ref 38.4–49.9)
Hemoglobin: 13.5 g/dL (ref 13.0–17.1)
LYMPHS ABS: 1.7 10*3/uL (ref 0.9–3.3)
LYMPHS PCT: 30 %
MCH: 26.4 pg — ABNORMAL LOW (ref 27.2–33.4)
MCHC: 32.2 g/dL (ref 32.0–36.0)
MCV: 82 fL (ref 79.3–98.0)
MONOS PCT: 7 %
Monocytes Absolute: 0.4 10*3/uL (ref 0.1–0.9)
NEUTROS ABS: 3.4 10*3/uL (ref 1.5–6.5)
Neutrophils Relative %: 59 %
Platelet Count: 110 10*3/uL — ABNORMAL LOW (ref 140–400)
RBC: 5.11 MIL/uL (ref 4.20–5.82)
RDW: 16.1 % — AB (ref 11.0–14.6)
WBC Count: 5.7 10*3/uL (ref 4.0–10.3)

## 2018-07-17 LAB — CMP (CANCER CENTER ONLY)
ALBUMIN: 2.7 g/dL — AB (ref 3.5–5.0)
ALK PHOS: 140 U/L — AB (ref 38–126)
ALT: 25 U/L (ref 0–44)
ANION GAP: 7 (ref 5–15)
AST: 24 U/L (ref 15–41)
BUN: 14 mg/dL (ref 8–23)
CHLORIDE: 102 mmol/L (ref 98–111)
CO2: 29 mmol/L (ref 22–32)
Calcium: 8.8 mg/dL — ABNORMAL LOW (ref 8.9–10.3)
Creatinine: 0.72 mg/dL (ref 0.61–1.24)
GFR, Est AFR Am: >60 mL/min (ref >60)
GFR, Estimated: >60 mL/min (ref >60)
GLUCOSE: 113 mg/dL — AB (ref 70–99)
POTASSIUM: 4.5 mmol/L (ref 3.5–5.1)
SODIUM: 138 mmol/L (ref 135–145)
Total Bilirubin: 0.7 mg/dL (ref 0.3–1.2)
Total Protein: 6.8 g/dL (ref 6.5–8.1)

## 2018-07-18 ENCOUNTER — Encounter: Payer: Self-pay | Admitting: Nurse Practitioner

## 2018-07-18 ENCOUNTER — Inpatient Hospital Stay (HOSPITAL_BASED_OUTPATIENT_CLINIC_OR_DEPARTMENT_OTHER): Payer: Medicaid Other | Admitting: Nurse Practitioner

## 2018-07-18 ENCOUNTER — Encounter (HOSPITAL_COMMUNITY): Payer: Self-pay

## 2018-07-18 ENCOUNTER — Ambulatory Visit (HOSPITAL_COMMUNITY)
Admission: RE | Admit: 2018-07-18 | Discharge: 2018-07-18 | Disposition: A | Discharge status: Home / Self Care | Payer: Medicaid Other | Source: Ambulatory Visit | Attending: Oncology | Admitting: Oncology

## 2018-07-18 VITALS — BP 102/69 | HR 55 | Temp 98.6°F | Resp 17 | Ht 70.0 in | Wt 126.5 lb

## 2018-07-18 DIAGNOSIS — C7951 Secondary malignant neoplasm of bone: Secondary | ICD-10-CM | POA: Insufficient documentation

## 2018-07-18 DIAGNOSIS — Z87891 Personal history of nicotine dependence: Secondary | ICD-10-CM

## 2018-07-18 DIAGNOSIS — C220 Liver cell carcinoma: Secondary | ICD-10-CM | POA: Insufficient documentation

## 2018-07-18 DIAGNOSIS — F419 Anxiety disorder, unspecified: Secondary | ICD-10-CM

## 2018-07-18 DIAGNOSIS — Z79899 Other long term (current) drug therapy: Secondary | ICD-10-CM | POA: Diagnosis not present

## 2018-07-18 DIAGNOSIS — M5031 Other cervical disc degeneration,  high cervical region: Secondary | ICD-10-CM | POA: Insufficient documentation

## 2018-07-18 DIAGNOSIS — R4182 Altered mental status, unspecified: Secondary | ICD-10-CM

## 2018-07-18 LAB — AFP TUMOR MARKER: AFP, SERUM, TUMOR MARKER: 11316 ng/mL — AB (ref 0.0–8.3)

## 2018-07-18 MED ORDER — IOHEXOL 300 MG/ML  SOLN
100.0000 mL | Freq: Once | INTRAMUSCULAR | Status: AC | PRN
  Administered 2018-07-18: 100 mL via INTRAVENOUS

## 2018-07-18 MED FILL — NexAVAR 200 MG TABS: 200 | 30 days supply | Qty: 120 | Fill #1

## 2018-07-18 NOTE — Addendum Note (Signed)
Addended by: Hughie Closs on: 07/18/2018 10:22 AM   Modules accepted: Orders
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
## 2018-07-19 ENCOUNTER — Telehealth: Payer: Self-pay | Admitting: Emergency Medicine

## 2018-07-19 ENCOUNTER — Telehealth: Payer: Self-pay | Admitting: Nurse Practitioner

## 2018-07-19 NOTE — Telephone Encounter (Addendum)
Scheduling message sent. Will follow up   ----- Message from Owens Shark, NP sent at 07/19/2018  9:03 AM EDT ----- Please make sure his appointment gets scheduled for next week as per the LOS from yesterday

## 2018-07-19 NOTE — Telephone Encounter (Signed)
[facility] - they are aware of pt appt date and time.  - sgo. Also aware appt date and time per 8/27 los.

## 2018-07-20 ENCOUNTER — Other Ambulatory Visit: Payer: Self-pay | Admitting: Nurse Practitioner

## 2018-07-20 DIAGNOSIS — C220 Liver cell carcinoma: Secondary | ICD-10-CM

## 2018-07-21 NOTE — Progress Notes (Signed)
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  Liver mass was noted on a CT scan in March 2018.  MRI of the liver 03/2017 showed multifocal hepatocellular carcinoma, including a dominant right liver mass, lateral left hepatic lobe mass, segment 6 mass, and numerous other tiny foci of arterial enhancing lesions.  He was started on sorafenib 05/04/2017.   CT C spine 07/18/2018: Large C2 body metastasis which erodes the posterior cortex and presumably extends into the epidural space.  Tumor also extends into the right posterior elements.  No pathologic fracture or malignant cord impingement.  Degenerative disease causes foraminal impingement bilaterally from C3-4 to C6-7.  There is degenerative cord compression at C4-5.  CT Abd/Pelvis 07/18/2018: The largest coalescent lesion in segment 8 laterally measures approximately 6.2 x 5.3 cm and previously measured 5.7 x 5.1 cm. The segment 3 lesion posteriorly measures a maximum of 4.1 x 1.8 cm on image number 23 of series 2. It previously measured 3.7 x 1.5 cm. 11 mm segment 2 lesion on image number 24 previously measured 8.5 mm. 15 mm lesion in the lateral aspect of the caudate lobe on image number 25 previously measured 9.5 mm. 18 mm segment 5 lesion on image number 27 previously measured 16 mm.  MRI Brain 06/12/2018: C2 vertebral body abnormal signal, potential metastasis. Consider non emergent CT cervical spine and/or bone scan.  CT abdomen/pelvis 04/14/2018:stable hepatic lesions with progressive tumor necrosis, no new lesions.   CT abdomen/pelvis 11/24/2017:cystitis; cirrhosis and multifocal hepatocellular carcinoma. 1 of the liver lesions with mild cavitation,possibly treatment related. Otherwise no convincing change from 09/07/2017. Cholelithiasis without evidence of cholecystitis.  CT Abd/Pelvis 09/07/2017: slight improvement in the multifocal hepatocellular carcinoma.  Location(s) of Symptomatic  Metastases: Cervical Spine-C2  Past/Anticipated chemotherapy by medical oncology, if any:  Dr. Benay Spice 06/22/2018 -He continues sorafenib for hepatocellular carcinoma. -He will undergo a restaging CT evaluation prior to an office visit 07/17/2018. -We will include the cervical spine in the CT evaluation to follow-up on the abnormal signal noted in C2 on the MRI brain.  Pain on a scale of 0-10 is: 6/10 pain in lower back, between hip lines   If Spine Met(s), symptoms, if any, include:  Bowel/Bladder retention or incontinence (please describe): No  Numbness or weakness in extremities (please describe): Numbness in hands due to neuropathy  Current Decadron regimen, if applicable: N/A  Ambulatory status? Walker? Wheelchair?: Uses a walker  BP 106/68 (BP Location: Left Arm, Patient Position: Sitting)   Pulse 66   Temp 98.9 F (37.2 C) (Oral)   Resp 18   Ht _0  (1.702 m)   Wt 124 lb 9.6 oz (56.5 kg)   SpO2 99%   BMI 19.52 kg/m    Wt Readings from Last 3 Encounters:  07/25/18 124 lb 9.6 oz (56.5 kg)  07/18/18 126 lb 8 oz (57.4 kg)  06/29/18 124 lb (56.2 kg)    SAFETY ISSUES:  Prior radiation? No  Pacemaker/ICD? No  Possible current pregnancy? No  Is the patient on methotrexate? No  Current Complaints / other details:   Lives at [facility]

## 2018-07-25 ENCOUNTER — Ambulatory Visit
Admission: RE | Admit: 2018-07-25 | Discharge: 2018-07-25 | Disposition: A | Discharge status: Home / Self Care | Payer: Medicaid Other | Source: Ambulatory Visit | Attending: Radiation Oncology | Admitting: Radiation Oncology

## 2018-07-25 ENCOUNTER — Encounter: Payer: Self-pay | Admitting: Radiation Oncology

## 2018-07-25 ENCOUNTER — Other Ambulatory Visit: Payer: Self-pay

## 2018-07-25 VITALS — BP 106/68 | HR 66 | Temp 98.9°F | Resp 18 | Ht 67.0 in | Wt 124.6 lb

## 2018-07-25 DIAGNOSIS — B1920 Unspecified viral hepatitis C without hepatic coma: Secondary | ICD-10-CM | POA: Insufficient documentation

## 2018-07-25 DIAGNOSIS — Z79899 Other long term (current) drug therapy: Secondary | ICD-10-CM | POA: Insufficient documentation

## 2018-07-25 DIAGNOSIS — G8929 Other chronic pain: Secondary | ICD-10-CM | POA: Diagnosis not present

## 2018-07-25 DIAGNOSIS — G629 Polyneuropathy, unspecified: Secondary | ICD-10-CM | POA: Diagnosis not present

## 2018-07-25 DIAGNOSIS — I10 Essential (primary) hypertension: Secondary | ICD-10-CM | POA: Insufficient documentation

## 2018-07-25 DIAGNOSIS — F17210 Nicotine dependence, cigarettes, uncomplicated: Secondary | ICD-10-CM | POA: Diagnosis not present

## 2018-07-25 DIAGNOSIS — M25562 Pain in left knee: Secondary | ICD-10-CM | POA: Diagnosis not present

## 2018-07-25 DIAGNOSIS — M1990 Unspecified osteoarthritis, unspecified site: Secondary | ICD-10-CM | POA: Diagnosis not present

## 2018-07-25 DIAGNOSIS — C220 Liver cell carcinoma: Secondary | ICD-10-CM

## 2018-07-25 DIAGNOSIS — F1021 Alcohol dependence, in remission: Secondary | ICD-10-CM | POA: Diagnosis not present

## 2018-07-25 DIAGNOSIS — Z96652 Presence of left artificial knee joint: Secondary | ICD-10-CM | POA: Insufficient documentation

## 2018-07-25 DIAGNOSIS — C7951 Secondary malignant neoplasm of bone: Principal | ICD-10-CM
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
## 2018-07-26 ENCOUNTER — Inpatient Hospital Stay: Payer: Medicaid Other | Attending: Nurse Practitioner | Admitting: Oncology

## 2018-07-26 ENCOUNTER — Encounter: Payer: Self-pay | Admitting: Nurse Practitioner

## 2018-07-26 ENCOUNTER — Encounter: Payer: Self-pay | Admitting: Oncology

## 2018-07-26 ENCOUNTER — Other Ambulatory Visit: Payer: Self-pay | Admitting: Radiation Therapy

## 2018-07-26 DIAGNOSIS — Z87891 Personal history of nicotine dependence: Secondary | ICD-10-CM | POA: Diagnosis not present

## 2018-07-26 DIAGNOSIS — M545 Low back pain: Secondary | ICD-10-CM | POA: Insufficient documentation

## 2018-07-26 DIAGNOSIS — C7951 Secondary malignant neoplasm of bone: Secondary | ICD-10-CM | POA: Insufficient documentation

## 2018-07-26 DIAGNOSIS — Z7189 Other specified counseling: Secondary | ICD-10-CM

## 2018-07-26 DIAGNOSIS — C220 Liver cell carcinoma: Secondary | ICD-10-CM | POA: Diagnosis not present

## 2018-07-26 MED ORDER — HYDROCODONE-ACETAMINOPHEN 5-325 MG PO TABS: 1.0000 | ORAL_TABLET | Freq: Four times a day (QID) | ORAL | 0 refills | Status: DC | PRN
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
## 2018-07-27 ENCOUNTER — Telehealth: Payer: Self-pay | Admitting: Oncology

## 2018-07-27 NOTE — Telephone Encounter (Signed)
Scheduled appt per 9/4 los - [place] aware of appts  - Patient sister is aware of appts also .

## 2018-07-28 ENCOUNTER — Other Ambulatory Visit: Payer: Self-pay | Admitting: Radiation Therapy

## 2018-07-28 DIAGNOSIS — C7951 Secondary malignant neoplasm of bone: Secondary | ICD-10-CM

## 2018-07-29 ENCOUNTER — Other Ambulatory Visit: Payer: Self-pay

## 2018-07-31 ENCOUNTER — Other Ambulatory Visit: Payer: Self-pay | Admitting: Radiation Therapy

## 2018-07-31 ENCOUNTER — Ambulatory Visit: Payer: Medicaid Other | Admitting: Radiation Oncology

## 2018-07-31 ENCOUNTER — Other Ambulatory Visit: Payer: Self-pay

## 2018-07-31 ENCOUNTER — Ambulatory Visit
Admission: RE | Admit: 2018-07-31 | Discharge: 2018-07-31 | Disposition: A | Discharge status: Home / Self Care | Payer: Medicaid Other | Source: Ambulatory Visit | Attending: Radiation Oncology | Admitting: Radiation Oncology

## 2018-07-31 DIAGNOSIS — C7951 Secondary malignant neoplasm of bone: Secondary | ICD-10-CM

## 2018-07-31 MED ORDER — GADOBENATE DIMEGLUMINE 529 MG/ML IV SOLN
10.0000 mL | Freq: Once | INTRAVENOUS | Status: AC | PRN
  Administered 2018-07-31: 10 mL via INTRAVENOUS

## 2018-08-01 ENCOUNTER — Telehealth: Payer: Self-pay | Admitting: Radiation Oncology

## 2018-08-01 ENCOUNTER — Ambulatory Visit: Payer: Medicaid Other | Admitting: Radiation Oncology

## 2018-08-01 ENCOUNTER — Other Ambulatory Visit: Payer: Self-pay | Admitting: Radiation Oncology

## 2018-08-01 DIAGNOSIS — C7951 Secondary malignant neoplasm of bone: Principal | ICD-10-CM

## 2018-08-01 DIAGNOSIS — C220 Liver cell carcinoma: Secondary | ICD-10-CM

## 2018-08-01 NOTE — Progress Notes (Signed)
Brain and Spine Tumor Board Documentation  Kyle Mejia was presented by Cecil Cobbs, MD at Brain and Spine Tumor Board on 08/01/2018, which included representatives from neuro oncology, radiation oncology, surgical oncology, genetics, navigation, pathology, radiology.  Kyle Mejia was presented as a current patient with history of the following treatments:  .  Additionally, we reviewed previous medical and familial history, history of present illness, and recent lab results along with all available histopathologic and imaging studies. The tumor board considered available treatment options and made the following recommendations:  Additional screening, Radiation therapy (primary modality)  Rec MRI total spine, then [facility] planning for thoracic and lumbar symptomatic lesions  Tumor board is a meeting of clinicians from various specialty areas who evaluate and discuss patients for whom a multidisciplinary approach is being considered. Final determinations in the plan of care are those of the provider(s). The responsibility for follow up of recommendations given during tumor board is that of the provider.   Today's extended care, comprehensive team conference, Kyle Mejia was not present for the discussion and was not examined.

## 2018-08-01 NOTE — Telephone Encounter (Signed)
Called and spoke with Dr. Maryjean Ka' office regarding new referral to pain [phone]. Faxing referral to [phone] to [facility].

## 2018-08-02 ENCOUNTER — Other Ambulatory Visit: Payer: Self-pay | Admitting: Radiation Therapy

## 2018-08-02 ENCOUNTER — Other Ambulatory Visit: Payer: Self-pay | Admitting: Radiation Oncology

## 2018-08-02 DIAGNOSIS — C7951 Secondary malignant neoplasm of bone: Secondary | ICD-10-CM

## 2018-08-04 ENCOUNTER — Other Ambulatory Visit: Payer: Self-pay | Admitting: Radiation Therapy

## 2018-08-04 ENCOUNTER — Ambulatory Visit (HOSPITAL_COMMUNITY): Payer: Medicaid Other

## 2018-08-04 ENCOUNTER — Ambulatory Visit: Payer: Medicaid Other | Admitting: Radiation Oncology

## 2018-08-04 ENCOUNTER — Ambulatory Visit (HOSPITAL_COMMUNITY): Admission: RE | Admit: 2018-08-04 | Payer: Medicaid Other | Source: Ambulatory Visit

## 2018-08-04 DIAGNOSIS — C7951 Secondary malignant neoplasm of bone: Secondary | ICD-10-CM

## 2018-08-05 ENCOUNTER — Other Ambulatory Visit: Payer: Self-pay | Admitting: Oncology

## 2018-08-07 ENCOUNTER — Ambulatory Visit: Payer: Medicaid Other | Admitting: Radiation Oncology

## 2018-08-07 ENCOUNTER — Ambulatory Visit (HOSPITAL_COMMUNITY): Payer: Medicaid Other

## 2018-08-07 ENCOUNTER — Other Ambulatory Visit: Payer: Self-pay

## 2018-08-08 ENCOUNTER — Inpatient Hospital Stay: Payer: Medicaid Other

## 2018-08-08 ENCOUNTER — Inpatient Hospital Stay: Payer: Medicaid Other | Admitting: Nurse Practitioner

## 2018-08-08 ENCOUNTER — Ambulatory Visit: Payer: Medicaid Other | Admitting: Radiation Oncology

## 2018-08-08 NOTE — Progress Notes (Signed)
Brain and Spine Tumor Board Documentation  Kyle Mejia was presented by Cecil Cobbs, MD at Brain and Spine Tumor Board on 08/08/2018, which included representatives from neuro oncology, radiation oncology, surgical oncology, genetics, navigation, pathology, radiology.  Kyle Mejia was presented as a current patient with history of the following treatments:  .  Additionally, we reviewed previous medical and familial history, history of present illness, and recent lab results along with all available histopathologic and imaging studies. The tumor board considered available treatment options and made the following recommendations:  Radiation therapy (primary modality)  RT planning for C3 and L2 lesions based on symptomatology  Tumor board is a meeting of clinicians from various specialty areas who evaluate and discuss patients for whom a multidisciplinary approach is being considered. Final determinations in the plan of care are those of the provider(s). The responsibility for follow up of recommendations given during tumor board is that of the provider.   Today's extended care, comprehensive team conference, Kyle Mejia was not present for the discussion and was not examined.

## 2018-08-09 ENCOUNTER — Ambulatory Visit: Payer: Medicaid Other | Admitting: Radiation Oncology

## 2018-08-09 ENCOUNTER — Ambulatory Visit (HOSPITAL_COMMUNITY): Payer: Medicaid Other

## 2018-08-09 ENCOUNTER — Encounter (HOSPITAL_COMMUNITY): Payer: Medicaid Other

## 2018-08-10 ENCOUNTER — Ambulatory Visit (HOSPITAL_COMMUNITY): Payer: Medicaid Other

## 2018-08-10 ENCOUNTER — Ambulatory Visit (HOSPITAL_COMMUNITY)
Admission: RE | Admit: 2018-08-10 | Discharge: 2018-08-10 | Disposition: A | Discharge status: Home / Self Care | Payer: Medicaid Other | Source: Ambulatory Visit | Attending: Radiation Oncology | Admitting: Radiation Oncology

## 2018-08-10 ENCOUNTER — Other Ambulatory Visit: Payer: Self-pay | Admitting: Radiation Therapy

## 2018-08-10 DIAGNOSIS — S2232XA Fracture of one rib, left side, initial encounter for closed fracture: Secondary | ICD-10-CM | POA: Insufficient documentation

## 2018-08-10 DIAGNOSIS — C7951 Secondary malignant neoplasm of bone: Secondary | ICD-10-CM | POA: Diagnosis not present

## 2018-08-10 DIAGNOSIS — X58XXXA Exposure to other specified factors, initial encounter: Secondary | ICD-10-CM | POA: Diagnosis not present

## 2018-08-10 MED ORDER — TECHNETIUM TC 99M MEDRONATE IV KIT
19.4000 | PACK | Freq: Once | INTRAVENOUS | Status: AC
  Administered 2018-08-10: 19.4 via INTRAVENOUS

## 2018-08-11 ENCOUNTER — Other Ambulatory Visit: Payer: Self-pay | Admitting: Radiation Therapy

## 2018-08-11 ENCOUNTER — Ambulatory Visit: Payer: Medicaid Other | Admitting: Radiation Oncology

## 2018-08-11 DIAGNOSIS — C7951 Secondary malignant neoplasm of bone: Secondary | ICD-10-CM

## 2018-08-12 ENCOUNTER — Ambulatory Visit (HOSPITAL_COMMUNITY): Payer: Medicaid Other

## 2018-08-13 ENCOUNTER — Ambulatory Visit (HOSPITAL_COMMUNITY): Admission: RE | Admit: 2018-08-13 | Payer: Medicaid Other | Source: Ambulatory Visit

## 2018-08-13 ENCOUNTER — Ambulatory Visit (HOSPITAL_COMMUNITY)
Admission: RE | Admit: 2018-08-13 | Discharge: 2018-08-13 | Disposition: A | Discharge status: Home / Self Care | Payer: Medicaid Other | Source: Ambulatory Visit | Attending: Radiation Oncology | Admitting: Radiation Oncology

## 2018-08-13 DIAGNOSIS — M48061 Spinal stenosis, lumbar region without neurogenic claudication: Secondary | ICD-10-CM | POA: Insufficient documentation

## 2018-08-13 DIAGNOSIS — C7951 Secondary malignant neoplasm of bone: Secondary | ICD-10-CM

## 2018-08-13 MED ORDER — GADOBUTROL 1 MMOL/ML IV SOLN
7.5000 mL | Freq: Once | INTRAVENOUS | Status: AC | PRN
  Administered 2018-08-13: 5.5 mL via INTRAVENOUS

## 2018-08-15 ENCOUNTER — Ambulatory Visit: Admission: RE | Admit: 2018-08-15 | Payer: Medicaid Other | Source: Ambulatory Visit | Admitting: Radiation Oncology

## 2018-08-15 ENCOUNTER — Ambulatory Visit (HOSPITAL_COMMUNITY)
Admission: RE | Admit: 2018-08-15 | Discharge: 2018-08-15 | Disposition: A | Discharge status: Home / Self Care | Payer: Medicaid Other | Source: Ambulatory Visit | Attending: Radiation Oncology | Admitting: Radiation Oncology

## 2018-08-16 ENCOUNTER — Inpatient Hospital Stay: Payer: Medicaid Other

## 2018-08-17 ENCOUNTER — Ambulatory Visit
Admission: RE | Admit: 2018-08-17 | Discharge: 2018-08-17 | Disposition: A | Discharge status: Home / Self Care | Payer: Medicaid Other | Source: Ambulatory Visit | Attending: Radiation Oncology | Admitting: Radiation Oncology

## 2018-08-17 DIAGNOSIS — C220 Liver cell carcinoma: Secondary | ICD-10-CM | POA: Insufficient documentation

## 2018-08-17 DIAGNOSIS — Z510 Encounter for antineoplastic radiation therapy: Secondary | ICD-10-CM | POA: Insufficient documentation

## 2018-08-17 DIAGNOSIS — C7951 Secondary malignant neoplasm of bone: Secondary | ICD-10-CM

## 2018-08-18 NOTE — Progress Notes (Signed)
Brain and Spine Tumor Board Documentation  Kyle Mejia was presented by Cecil Cobbs, MD at Brain and Spine Tumor Board on 08/18/2018, which included representatives from neuro oncology, radiation oncology, surgical oncology, radiology, pathology, navigation, genetics.  Kyle Mejia was presented as a current patient with history of the following treatments:  .  Additionally, we reviewed previous medical and familial history, history of present illness, and recent lab results along with all available histopathologic and imaging studies. The tumor board considered available treatment options and made the following recommendations:  Radiation therapy (primary modality)  Tumor board is a meeting of clinicians from various specialty areas who evaluate and discuss patients for whom a multidisciplinary approach is being considered. Final determinations in the plan of care are those of the provider(s). The responsibility for follow up of recommendations given during tumor board is that of the provider.   Today's extended care, comprehensive team conference, Kyle Mejia was not present for the discussion and was not examined.

## 2018-08-19 NOTE — Progress Notes (Signed)
This is a blank note please discard 

## 2018-08-21 ENCOUNTER — Inpatient Hospital Stay (HOSPITAL_COMMUNITY)
Admission: EM | Admit: 2018-08-21 | Discharge: 2018-08-25 | DRG: 948 | Disposition: A | Discharge status: Nursing Home (Includes ICF) | Payer: Medicaid Other | Source: Skilled Nursing Facility | Attending: Family Medicine | Admitting: Family Medicine

## 2018-08-21 ENCOUNTER — Encounter (HOSPITAL_COMMUNITY): Payer: Self-pay | Admitting: Emergency Medicine

## 2018-08-21 ENCOUNTER — Other Ambulatory Visit: Payer: Self-pay

## 2018-08-21 DIAGNOSIS — M542 Cervicalgia: Secondary | ICD-10-CM

## 2018-08-21 DIAGNOSIS — X58XXXA Exposure to other specified factors, initial encounter: Secondary | ICD-10-CM | POA: Diagnosis present

## 2018-08-21 DIAGNOSIS — G9589 Other specified diseases of spinal cord: Secondary | ICD-10-CM | POA: Diagnosis present

## 2018-08-21 DIAGNOSIS — K5900 Constipation, unspecified: Secondary | ICD-10-CM | POA: Diagnosis present

## 2018-08-21 DIAGNOSIS — F17210 Nicotine dependence, cigarettes, uncomplicated: Secondary | ICD-10-CM | POA: Diagnosis present

## 2018-08-21 DIAGNOSIS — G8929 Other chronic pain: Secondary | ICD-10-CM | POA: Diagnosis present

## 2018-08-21 DIAGNOSIS — M4802 Spinal stenosis, cervical region: Secondary | ICD-10-CM | POA: Diagnosis present

## 2018-08-21 DIAGNOSIS — G4700 Insomnia, unspecified: Secondary | ICD-10-CM | POA: Diagnosis present

## 2018-08-21 DIAGNOSIS — C7951 Secondary malignant neoplasm of bone: Secondary | ICD-10-CM | POA: Diagnosis present

## 2018-08-21 DIAGNOSIS — F419 Anxiety disorder, unspecified: Secondary | ICD-10-CM | POA: Diagnosis present

## 2018-08-21 DIAGNOSIS — M4804 Spinal stenosis, thoracic region: Secondary | ICD-10-CM | POA: Diagnosis present

## 2018-08-21 DIAGNOSIS — F329 Major depressive disorder, single episode, unspecified: Secondary | ICD-10-CM | POA: Diagnosis present

## 2018-08-21 DIAGNOSIS — C220 Liver cell carcinoma: Secondary | ICD-10-CM | POA: Diagnosis present

## 2018-08-21 DIAGNOSIS — Z923 Personal history of irradiation: Secondary | ICD-10-CM

## 2018-08-21 DIAGNOSIS — R64 Cachexia: Secondary | ICD-10-CM | POA: Diagnosis present

## 2018-08-21 DIAGNOSIS — M549 Dorsalgia, unspecified: Secondary | ICD-10-CM | POA: Diagnosis present

## 2018-08-21 DIAGNOSIS — F1011 Alcohol abuse, in remission: Secondary | ICD-10-CM

## 2018-08-21 DIAGNOSIS — Z66 Do not resuscitate: Secondary | ICD-10-CM | POA: Diagnosis present

## 2018-08-21 DIAGNOSIS — Z87820 Personal history of traumatic brain injury: Secondary | ICD-10-CM

## 2018-08-21 DIAGNOSIS — I10 Essential (primary) hypertension: Secondary | ICD-10-CM | POA: Diagnosis present

## 2018-08-21 DIAGNOSIS — Z79899 Other long term (current) drug therapy: Secondary | ICD-10-CM

## 2018-08-21 DIAGNOSIS — Z681 Body mass index (BMI) 19 or less, adult: Secondary | ICD-10-CM

## 2018-08-21 DIAGNOSIS — R339 Retention of urine, unspecified: Secondary | ICD-10-CM | POA: Diagnosis present

## 2018-08-21 DIAGNOSIS — M1990 Unspecified osteoarthritis, unspecified site: Secondary | ICD-10-CM | POA: Diagnosis present

## 2018-08-21 DIAGNOSIS — F32A Depression, unspecified: Secondary | ICD-10-CM | POA: Diagnosis present

## 2018-08-21 DIAGNOSIS — R32 Unspecified urinary incontinence: Secondary | ICD-10-CM | POA: Diagnosis present

## 2018-08-21 DIAGNOSIS — M545 Low back pain, unspecified: Secondary | ICD-10-CM

## 2018-08-21 DIAGNOSIS — G893 Neoplasm related pain (acute) (chronic): Principal | ICD-10-CM | POA: Diagnosis present

## 2018-08-21 DIAGNOSIS — M8580 Other specified disorders of bone density and structure, unspecified site: Secondary | ICD-10-CM | POA: Diagnosis present

## 2018-08-21 DIAGNOSIS — R739 Hyperglycemia, unspecified: Secondary | ICD-10-CM | POA: Diagnosis not present

## 2018-08-21 DIAGNOSIS — S2232XA Fracture of one rib, left side, initial encounter for closed fracture: Secondary | ICD-10-CM | POA: Diagnosis present

## 2018-08-21 DIAGNOSIS — Z96652 Presence of left artificial knee joint: Secondary | ICD-10-CM | POA: Diagnosis present

## 2018-08-21 DIAGNOSIS — M25512 Pain in left shoulder: Secondary | ICD-10-CM | POA: Diagnosis present

## 2018-08-21 DIAGNOSIS — Z79891 Long term (current) use of opiate analgesic: Secondary | ICD-10-CM

## 2018-08-21 DIAGNOSIS — M48061 Spinal stenosis, lumbar region without neurogenic claudication: Secondary | ICD-10-CM | POA: Diagnosis present

## 2018-08-21 DIAGNOSIS — Z8249 Family history of ischemic heart disease and other diseases of the circulatory system: Secondary | ICD-10-CM

## 2018-08-21 DIAGNOSIS — B1920 Unspecified viral hepatitis C without hepatic coma: Secondary | ICD-10-CM | POA: Diagnosis present

## 2018-08-21 LAB — COMPREHENSIVE METABOLIC PANEL
ALBUMIN: 3 g/dL — AB (ref 3.5–5.0)
ALK PHOS: 136 U/L — AB (ref 38–126)
ALT: 26 U/L (ref 0–44)
AST: 30 U/L (ref 15–41)
Anion gap: 13 (ref 5–15)
BILIRUBIN TOTAL: 1.3 mg/dL — AB (ref 0.3–1.2)
BUN: 23 mg/dL (ref 8–23)
CALCIUM: 9.6 mg/dL (ref 8.9–10.3)
CO2: 28 mmol/L (ref 22–32)
Chloride: 104 mmol/L (ref 98–111)
Creatinine, Ser: 0.57 mg/dL — ABNORMAL LOW (ref 0.61–1.24)
GFR calc Af Amer: >60 mL/min (ref >60)
GFR calc non Af Amer: >60 mL/min (ref >60)
GLUCOSE: 116 mg/dL — AB (ref 70–99)
Potassium: 3.9 mmol/L (ref 3.5–5.1)
SODIUM: 145 mmol/L (ref 135–145)
TOTAL PROTEIN: 7.4 g/dL (ref 6.5–8.1)

## 2018-08-21 LAB — URINALYSIS, ROUTINE W REFLEX MICROSCOPIC
BACTERIA UA: NONE SEEN
BILIRUBIN URINE: NEGATIVE
Glucose, UA: NEGATIVE mg/dL
KETONES UR: 20 mg/dL — AB
Leukocytes, UA: NEGATIVE
Nitrite: NEGATIVE
PH: 5 (ref 5.0–8.0)
Protein, ur: NEGATIVE mg/dL
SPECIFIC GRAVITY, URINE: 1.024 (ref 1.005–1.030)

## 2018-08-21 LAB — CBC WITH DIFFERENTIAL/PLATELET
BASOS ABS: 0 10*3/uL (ref 0.0–0.1)
BASOS PCT: 0 %
Eosinophils Absolute: 0 10*3/uL (ref 0.0–0.7)
Eosinophils Relative: 0 %
HEMATOCRIT: 44.4 % (ref 39.0–52.0)
HEMOGLOBIN: 14.2 g/dL (ref 13.0–17.0)
Lymphocytes Relative: 19 %
Lymphs Abs: 1.9 10*3/uL (ref 0.7–4.0)
MCH: 27.4 pg (ref 26.0–34.0)
MCHC: 32 g/dL (ref 30.0–36.0)
MCV: 85.7 fL (ref 78.0–100.0)
Monocytes Absolute: 1.2 10*3/uL — ABNORMAL HIGH (ref 0.1–1.0)
Monocytes Relative: 12 %
NEUTROS ABS: 6.7 10*3/uL (ref 1.7–7.7)
Neutrophils Relative %: 69 %
Platelets: 210 10*3/uL (ref 150–400)
RBC: 5.18 MIL/uL (ref 4.22–5.81)
RDW: 17.2 % — ABNORMAL HIGH (ref 11.5–15.5)
WBC: 9.9 10*3/uL (ref 4.0–10.5)

## 2018-08-21 MED ORDER — PAROXETINE HCL 20 MG PO TABS
20.0000 mg | ORAL_TABLET | Freq: Every day | ORAL | Status: DC
  Administered 2018-08-22 – 2018-08-25 (×4): 20 mg via ORAL
  Filled 2018-08-21 (×4): qty 1

## 2018-08-21 MED ORDER — OXYCODONE-ACETAMINOPHEN 5-325 MG PO TABS: 1.0000 | ORAL_TABLET | Freq: Once | ORAL | Status: DC

## 2018-08-21 MED ORDER — ACETAMINOPHEN 325 MG PO TABS
650.0000 mg | ORAL_TABLET | Freq: Four times a day (QID) | ORAL | Status: DC | PRN
  Administered 2018-08-23: 650 mg via ORAL
  Filled 2018-08-21: qty 2

## 2018-08-21 MED ORDER — CLONAZEPAM 0.5 MG PO TABS
0.5000 mg | ORAL_TABLET | Freq: Three times a day (TID) | ORAL | Status: DC
  Administered 2018-08-21 – 2018-08-25 (×11): 0.5 mg via ORAL
  Filled 2018-08-21 (×11): qty 1

## 2018-08-21 MED ORDER — MORPHINE SULFATE (PF) 2 MG/ML IV SOLN
2.0000 mg | INTRAVENOUS | Status: DC | PRN
  Administered 2018-08-21 – 2018-08-22 (×2): 2 mg via INTRAVENOUS
  Filled 2018-08-21 (×2): qty 1

## 2018-08-21 MED ORDER — POLYETHYLENE GLYCOL 3350 17 G PO PACK
17.0000 g | PACK | Freq: Two times a day (BID) | ORAL | Status: DC
  Administered 2018-08-21 – 2018-08-25 (×7): 17 g via ORAL
  Filled 2018-08-21 (×7): qty 1

## 2018-08-21 MED ORDER — ACETAMINOPHEN 650 MG RE SUPP: 650.0000 mg | Freq: Four times a day (QID) | RECTAL | Status: DC | PRN

## 2018-08-21 MED ORDER — FOLIC ACID 1 MG PO TABS
1.0000 mg | ORAL_TABLET | Freq: Every day | ORAL | Status: DC
  Administered 2018-08-22 – 2018-08-25 (×4): 1 mg via ORAL
  Filled 2018-08-21 (×4): qty 1

## 2018-08-21 MED ORDER — ONDANSETRON HCL 4 MG/2ML IJ SOLN
4.0000 mg | Freq: Four times a day (QID) | INTRAMUSCULAR | Status: DC | PRN
  Administered 2018-08-23: 4 mg via INTRAVENOUS
  Filled 2018-08-21: qty 2

## 2018-08-21 MED ORDER — SORAFENIB TOSYLATE 200 MG PO TABS
400.0000 mg | ORAL_TABLET | Freq: Two times a day (BID) | ORAL | Status: DC
  Filled 2018-08-21: qty 2

## 2018-08-21 MED ORDER — DEXAMETHASONE SODIUM PHOSPHATE 4 MG/ML IJ SOLN
4.0000 mg | Freq: Three times a day (TID) | INTRAMUSCULAR | Status: DC
  Administered 2018-08-21 – 2018-08-23 (×5): 4 mg via INTRAVENOUS
  Filled 2018-08-21 (×6): qty 1

## 2018-08-21 MED ORDER — MORPHINE SULFATE (PF) 4 MG/ML IV SOLN
4.0000 mg | INTRAVENOUS | Status: DC | PRN
  Administered 2018-08-21: 4 mg via INTRAVENOUS
  Filled 2018-08-21: qty 1

## 2018-08-21 MED ORDER — ONDANSETRON HCL 4 MG PO TABS: 4.0000 mg | ORAL_TABLET | Freq: Four times a day (QID) | ORAL | Status: DC | PRN

## 2018-08-21 MED ORDER — MORPHINE SULFATE (PF) 2 MG/ML IV SOLN
1.0000 mg | Freq: Once | INTRAVENOUS | Status: AC
  Administered 2018-08-21: 1 mg via INTRAVENOUS
  Filled 2018-08-21: qty 1

## 2018-08-21 MED ORDER — POTASSIUM CHLORIDE CRYS ER 20 MEQ PO TBCR
20.0000 meq | EXTENDED_RELEASE_TABLET | Freq: Every day | ORAL | Status: DC
  Administered 2018-08-22 – 2018-08-24 (×3): 20 meq via ORAL
  Filled 2018-08-21 (×3): qty 1

## 2018-08-21 MED ORDER — ALBUTEROL SULFATE (2.5 MG/3ML) 0.083% IN NEBU: 3.0000 mL | INHALATION_SOLUTION | Freq: Four times a day (QID) | RESPIRATORY_TRACT | Status: DC | PRN

## 2018-08-21 MED ORDER — MAGNESIUM OXIDE 400 (241.3 MG) MG PO TABS
400.0000 mg | ORAL_TABLET | Freq: Two times a day (BID) | ORAL | Status: DC
  Administered 2018-08-21 – 2018-08-25 (×8): 400 mg via ORAL
  Filled 2018-08-21 (×8): qty 1

## 2018-08-21 MED ORDER — ENOXAPARIN SODIUM 40 MG/0.4ML ~~LOC~~ SOLN
40.0000 mg | Freq: Every day | SUBCUTANEOUS | Status: DC
  Administered 2018-08-21 – 2018-08-24 (×4): 40 mg via SUBCUTANEOUS
  Filled 2018-08-21 (×4): qty 0.4

## 2018-08-21 MED ORDER — PENTOXIFYLLINE ER 400 MG PO TBCR
400.0000 mg | EXTENDED_RELEASE_TABLET | Freq: Three times a day (TID) | ORAL | Status: DC
  Administered 2018-08-22 – 2018-08-25 (×10): 400 mg via ORAL
  Filled 2018-08-21 (×12): qty 1

## 2018-08-21 MED ORDER — PANTOPRAZOLE SODIUM 40 MG PO TBEC
40.0000 mg | DELAYED_RELEASE_TABLET | Freq: Every day | ORAL | Status: DC
  Administered 2018-08-22 – 2018-08-25 (×4): 40 mg via ORAL
  Filled 2018-08-21 (×4): qty 1

## 2018-08-21 MED ORDER — LISINOPRIL 10 MG PO TABS
10.0000 mg | ORAL_TABLET | Freq: Every day | ORAL | Status: DC
  Administered 2018-08-22 – 2018-08-25 (×4): 10 mg via ORAL
  Filled 2018-08-21 (×4): qty 1

## 2018-08-21 MED ORDER — ENSURE ENLIVE PO LIQD
237.0000 mL | Freq: Three times a day (TID) | ORAL | Status: DC
  Administered 2018-08-22 – 2018-08-25 (×9): 237 mL via ORAL

## 2018-08-21 MED ORDER — METOPROLOL SUCCINATE ER 25 MG PO TB24
25.0000 mg | ORAL_TABLET | Freq: Every day | ORAL | Status: DC
  Administered 2018-08-22 – 2018-08-25 (×4): 25 mg via ORAL
  Filled 2018-08-21 (×4): qty 1

## 2018-08-21 MED ORDER — CLONIDINE HCL 0.2 MG PO TABS
0.2000 mg | ORAL_TABLET | Freq: Two times a day (BID) | ORAL | Status: DC
  Administered 2018-08-21 – 2018-08-25 (×8): 0.2 mg via ORAL
  Filled 2018-08-21 (×8): qty 1

## 2018-08-21 MED ORDER — CHOLESTYRAMINE 4 G PO PACK
4.0000 g | PACK | Freq: Every morning | ORAL | Status: DC
  Administered 2018-08-22 – 2018-08-25 (×3): 4 g via ORAL
  Filled 2018-08-21 (×4): qty 1

## 2018-08-21 NOTE — ED Triage Notes (Signed)
Patient BIB [facility] from [facility] for worsening chronic lower back and neck pain. Pt takes OxyContin, last given at 10 am but EMS reports pain has increased over last few days and unable to control. Hx of liver CA that has spread to his back and neck, per EMS.
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
## 2018-08-21 NOTE — H&P (Signed)
History and Physical    Kyle Mejia NFA:[id number] DOB: [birth year] DOA: 08/21/2018  PCP: Azzie Glatter, FNP   Patient coming from:K[place] Mejia, resident for ~3 months.  Chief Complaint: Back and neck pain  HPI: Kyle Mejia is a 62 y.o. 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  Patient has been receiving palliative radiation therapy to spine for known metastasis.  Patient denies actual lower extremity weakness but states the pain in his back room prevents him from walking.  Reports urinary incontinence and constipation.   ED Course: Elevated blood pressure systolic to 469G, otherwise stable.  Bladder scan- 74cc urine.  patient was given IV morphine with persistence of pain in ED. EDP talked to Dr. Tammi Klippel on call for radiation oncology, with plans to move patients already scheduled radiation therapy session for Thursday 08/24/18 to Tuesday 08/22/18 or Wednesday as outpatient.  But attempts to ambulate patient in the ED was unsuccessful, patient's still had persistent uncontrolled pain.  Hospitalist was called to admit.   Review of Systems: As per HPI all other systems reviewed and negative  Past Medical History:  Diagnosis Date  . Alcoholism ([place])   . Cancer ([place]) 01/20/2017   hepatic  . Chronic left shoulder pain   . Chronic pain of left knee   . Depression   . Hepatitis C   . History of alcoholism (Kyle Mejia)   . Hypertension   . Insomnia   . Left knee injury   . Neuropathy   . Osteoarthritis   . Osteopenia   . Tobacco dependence     Past Surgical History:  Procedure Laterality Date  . KNEE RECONSTRUCTION     left knee   . knee surgery Left   . TOTAL KNEE ARTHROPLASTY Left 02/15/2018   Procedure: LEFT TOTAL KNEE ARTHROPLASTY;  Surgeon: Newt Minion, MD;  Location:  [facility];  Service: Orthopedics;  Laterality: Left;     reports that he has been smoking cigarettes. He started smoking about 46 years ago. He has been smoking about 0.25 packs per day. He has never used smokeless tobacco. He reports that he does not drink alcohol or use drugs.  No Known Allergies  Family History  Problem Relation Age of Onset  . Heart disease Mother   . Alcoholism Paternal Uncle     Prior to Admission medications   Medication Sig Start Date End Date Taking? Authorizing Provider  albuterol (PROVENTIL HFA;VENTOLIN HFA) 108 (90 Base) MCG/ACT inhaler Inhale 2 puffs into the lungs every 6 (six) hours as needed for wheezing or shortness of breath.   Yes [provider]  cholestyramine (QUESTRAN) 4 g packet Take 4 g by mouth every morning. Mix in 2-6 oz. Of water or juice and drink once daily   Yes [provider]  clonazePAM (KLONOPIN) 0.5 MG tablet Take 0.5 mg by mouth 3 (three) times daily.    Yes [provider]  cloNIDine (CATAPRES) 0.2 MG tablet Take 0.2 mg by mouth 2 (two) times daily.   Yes [provider]  dextromethorphan-guaiFENesin (MUCINEX DM) 30-600 MG 12hr tablet Take 1 tablet by mouth 2 (two) times daily.   Yes [provider]  feeding supplement, ENSURE COMPLETE, (ENSURE COMPLETE) LIQD Take 237 mLs by mouth 3 (three) times daily between meals. 06/27/18  Yes Kathe Becton  M, FNP  folic acid (FOLVITE) 1 MG tablet Take 1 tablet (1 mg total) by mouth daily. 07/26/17  Yes Scot Jun, FNP  hydrOXYzine (ATARAX/VISTARIL) 50 MG tablet Take 4 tablets (200 mg total) by mouth at bedtime. 07/26/17  Yes Scot Jun, FNP  lisinopril (PRINIVIL,ZESTRIL) 10 MG tablet [place] DAY Patient taking differently: Take 10 mg by mouth daily.  07/26/17  Yes Scot Jun, FNP  loperamide (IMODIUM) 2 MG capsule Take 2 tablets after first loose stool, then 1 tablet after each subsequent loose stool, maximum 8 tablets per  day Patient taking differently: Take 2 mg by mouth See admin instructions. Take 2 tablets (4mg ) by mouth after first loose stool, then 1 tablet after each subsequent loose stool (max 8 tablets per day) 01/27/18  Yes Scot Jun, FNP  LORazepam (ATIVAN) 0.5 MG tablet Take 1 every 12 hours if needed for anxiety Patient taking differently: Take 0.5 mg by mouth every 12 (twelve) hours as needed for anxiety.  06/14/18  Yes Milton Ferguson, MD  Magnesium 400 MG TABS Take 400 mg by mouth 2 (two) times daily. For treatment of hypomagnesia 11/16/17  Yes Scot Jun, FNP  metoprolol succinate (TOPROL-XL) 25 MG 24 hr tablet Take 1 tablet (25 mg total) by mouth daily. 07/26/17  Yes Scot Jun, FNP  NEXAVAR 200 MG tablet TAKE 2 TABLETS (400MG ) BY MOUTH TWO TIMES DAILY. GIVE ON AN EMPTY STOMACH 1 HOUR BEFORE OR 2 HOURS AFTER MEALS. Patient taking differently: Take 400 mg by mouth 2 (two) times daily. On empty stomach 1 hour before a meal or 2 hours after a meal 06/15/18  Yes Ladell Pier, MD  nitroGLYCERIN (NITRODUR - DOSED IN MG/24 HR) 0.2 mg/hr patch Place 0.2 mg onto the skin daily. Apply one patch topically every day after removing old patch   Yes [provider]  omeprazole (PRILOSEC) 20 MG capsule Take 20 mg by mouth daily.   Yes [provider]  oxyCODONE (OXY IR/ROXICODONE) 5 MG immediate release tablet Take 5-10 mg by mouth every 4 (four) hours as needed for severe pain.   Yes [provider]  PARoxetine (PAXIL) 20 MG tablet Take 1 tablet (20 mg total) by mouth daily. 07/26/17  Yes Scot Jun, FNP  pentoxifylline (TRENTAL) 400 MG CR tablet Take 1 tablet (400 mg total) by mouth 3 (three) times daily with meals. 03/01/18  Yes Newt Minion, MD  potassium chloride SA (K-DUR,KLOR-CON) 20 MEQ tablet Take 1 tablet (20 mEq total) by mouth daily. 01/09/18  Yes Owens Shark, NP  escitalopram (LEXAPRO) 10 MG tablet Take 1 tablet (10 mg total) by mouth at  bedtime. Patient not taking: Reported on 08/21/2018 11/11/17   Scot Jun, FNP  HYDROcodone-acetaminophen (NORCO/VICODIN) 5-325 MG tablet Take 1 tablet by mouth every 6 (six) hours as needed for moderate pain. Patient not taking: Reported on 08/21/2018 07/26/18   Ladell Pier, MD  mupirocin ointment (BACTROBAN) 2 % Apply 1 application topically 2 (two) times daily. Apply to the affected area 2 times a day Patient not taking: Reported on 08/21/2018 03/01/18   Newt Minion, MD    Physical Exam: Vitals:   08/21/18 1320 08/21/18 1400 08/21/18 1500 08/21/18 1845  BP: (!) 156/95 (!) 170/100 (!) 167/95 (!) 190/121  Pulse: 77 78 67 96  Resp: 18  18 18   Temp: 98 F (36.7 C)     TempSrc: Oral  SpO2: 100% 97% (!) 85% 99%  Weight: 55.3 kg     Height: 5\' 8"  (1.727 m)       Constitutional: NAD, calm, comfortable Vitals:   08/21/18 1320 08/21/18 1400 08/21/18 1500 08/21/18 1845  BP: (!) 156/95 (!) 170/100 (!) 167/95 (!) 190/121  Pulse: 77 78 67 96  Resp: 18  18 18   Temp: 98 F (36.7 C)     TempSrc: Oral     SpO2: 100% 97% (!) 85% 99%  Weight: 55.3 kg     Height: 5\' 8"  (1.727 m)      Eyes: PERRL, lids and conjunctivae normal ENMT: Mucous membranes are moist. Posterior pharynx clear of any exudate or lesions.  Neck: normal, supple, no masses, no thyromegaly Respiratory: clear to auscultation bilaterally, no wheezing, no crackles. Normal respiratory effort. No accessory muscle use.  Cardiovascular: Regular rate and rhythm, no murmurs / rubs / gallops. No extremity edema. 2+ pedal pulses. No carotid bruits.  Abdomen: no tenderness, no masses palpated. No hepatosplenomegaly. Bowel sounds positive.  Musculoskeletal: no clubbing / cyanosis. No joint deformity upper and lower extremities. Good ROM, no contractures. Normal muscle tone.  Skin: no rashes, lesions, ulcers. No induration Neurologic: CN 2-12 grossly intact. Able to move legs, but not against gravity 2/2 back pain. Sensation  intact. Psychiatric: Normal judgment and insight. Alert and oriented x 3. Normal mood.    Labs on Admission: I have personally reviewed following labs and imaging studies  CBC: Recent Labs  Lab 08/21/18 1456  WBC 9.9  NEUTROABS 6.7  HGB 14.2  HCT 44.4  MCV 85.7  PLT 938   Basic Metabolic Panel: Recent Labs  Lab 08/21/18 1456  NA 145  K 3.9  CL 104  CO2 28  GLUCOSE 116*  BUN 23  CREATININE 0.57*  CALCIUM 9.6   Liver Function Tests: Recent Labs  Lab 08/21/18 1456  AST 30  ALT 26  ALKPHOS 136*  BILITOT 1.3*  PROT 7.4  ALBUMIN 3.0*   Urine analysis:    Component Value Date/Time   COLORURINE YELLOW 08/21/2018 [place] 08/21/2018 1456   LABSPEC 1.024 08/21/2018 1456   PHURINE 5.0 08/21/2018 1456   GLUCOSEU NEGATIVE 08/21/2018 1456   HGBUR SMALL (A) 08/21/2018 1456   BILIRUBINUR NEGATIVE 08/21/2018 1456   BILIRUBINUR small (A) 06/27/2018 1326   KETONESUR 20 (A) 08/21/2018 1456   PROTEINUR NEGATIVE 08/21/2018 1456   UROBILINOGEN 0.2 06/27/2018 1326   UROBILINOGEN 0.2 12/23/2017 1045   NITRITE NEGATIVE 08/21/2018 1456   LEUKOCYTESUR NEGATIVE 08/21/2018 1456    Radiological Exams on Admission: No results found.  EKG: None   Assessment/Plan Active Problems:   History of alcohol abuse   Depression   Hepatocellular carcinoma (H[facility])   Intractable back pain  Intractable back and neck pain- 2/2 mets to spine.  MRI spine 08/13/2018-T2 and L2 lesions, MRI 07/31/2018 68mm C2 metastasis, multilevel degenerative changes of cervical spine, severe spinal canal stenosis C5-C6 and cord edema/myelomalacia. -IV morphine for pain -IV Decadron 4 mg every 8 hourly -Consider reconsulting with radiation oncology inpatient for possible radiation therapy  Hepatocellular cancer-patient on sorafenib. Follows with Dr. Learta Codding. Currently on palliative radiation therapy.  History of alcohol abuse-denies alcoholic drink in the past year.  Resides in a nursing  home.  Hypertension-elevated -Cont home medications clonidine, lisinopril, metoprolol.  Depression- cont home paroxetine, and clonazepam  DVT prophylaxis: Lovenox Code Status: Full- Confirmed at bedside Family Communication: None at bedside Disposition Plan: 1-2 days Consults  called: Radiation oncology consult in a.m Admission status: Obs, med-surg   Bethena Roys MD [facility] Pager [phone] From 6PM-2AM.  Otherwise please contact night-coverage [email or web address] Password [facility]  08/21/2018, 9:46 PM

## 2018-08-21 NOTE — ED Notes (Signed)
ED TO INPATIENT HANDOFF REPORT  Name/Age/Gender Kyle Mejia 62 y.o. male  Code Status Code Status History    Date Active Date Inactive Code Status Order ID Comments User Context   02/15/2018 1239 02/22/2018 1544 Full Code [id number]  Newt Minion, MD Inpatient   01/24/2017 1638 02/09/2017 2235 Full Code [id number]  Judeth Horn, MD ED   08/08/2016 1641 08/15/2016 1517 Full Code [id number]  Milbert Coulter, RN Inpatient   08/08/2016 0034 08/08/2016 1525 Full Code [id number]  Roxanna Mew, PA-C ED   03/19/2016 1645 03/23/2016 1925 Full Code [id number]  Encarnacion Slates, NP Inpatient   03/19/2016 1341 03/19/2016 1645 Full Code [id number]  Gloriann Loan, PA-C ED    Advance Directive Documentation     Most Recent Value  Type of Advance Directive  Healthcare Power of Attorney, Living will  Pre-existing out of facility DNR order (yellow form or pink MOST form)  -  "MOST" Form in Place?  -      Home/SNF/Other Nursing Home  Chief Complaint Back Pain  Level of Care/Admitting Diagnosis ED Disposition    ED Disposition Condition [place]: [facility] [100102]  Level of Care: Med-Surg [16]  Diagnosis: Intractable back pain [500938]  Admitting Physician: Bethena Roys [[id number]]  Attending Physician: Bethena Roys Nessa.Cuff  PT Class (Do Not Modify): Observation [104]  PT Acc Code (Do Not Modify): Observation [10022]       Medical History Past Medical History:  Diagnosis Date  . Alcoholism ([place])   . Cancer ([place]) 01/20/2017   hepatic  . Chronic left shoulder pain   . Chronic pain of left knee   . Depression   . Hepatitis C   . History of alcoholism ([place])   . Hypertension   . Insomnia   . Left knee injury   . Neuropathy   . Osteoarthritis   . Osteopenia   . Tobacco dependence     Allergies No Known Allergies  IV Location/Drains/Wounds Patient Lines/Drains/Airways Status   Active Line/Drains/Airways    Name:   Placement  date:   Placement time:   Site:   Days:   Peripheral IV 08/21/18 Right Forearm   08/21/18    1903    Forearm   less than 1   Incision (Closed) 01/24/17 Elbow Left;Posterior   01/24/17    -     574   Incision (Closed) 02/15/18 Knee Left   02/15/18    0852     187          Labs/Imaging Results for orders placed or performed during the hospital encounter of 08/21/18 (from the past 48 hour(s))  CBC with Differential     Status: Abnormal   Collection Time: 08/21/18  2:56 PM  Result Value Ref Range   WBC 9.9 4.0 - 10.5 K/uL   RBC 5.18 4.22 - 5.81 MIL/uL   Hemoglobin 14.2 13.0 - 17.0 g/dL   HCT 44.4 39.0 - 52.0 %   MCV 85.7 78.0 - 100.0 fL   MCH 27.4 26.0 - 34.0 pg   MCHC 32.0 30.0 - 36.0 g/dL   RDW 17.2 (H) 11.5 - 15.5 %   Platelets 210 150 - 400 K/uL   Neutrophils Relative % 69 %   Neutro Abs 6.7 1.7 - 7.7 K/uL   Lymphocytes Relative 19 %   Lymphs Abs 1.9 0.7 - 4.0 K/uL   Monocytes Relative 12 %   Monocytes Absolute 1.2 (  H) 0.1 - 1.0 K/uL   Eosinophils Relative 0 %   Eosinophils Absolute 0.0 0.0 - 0.7 K/uL   Basophils Relative 0 %   Basophils Absolute 0.0 0.0 - 0.1 K/uL    Comment: Performed at [facility], [place] [address] [place], [place] [address]  Comprehensive metabolic panel     Status: Abnormal   Collection Time: 08/21/18  2:56 PM  Result Value Ref Range   Sodium 145 135 - 145 mmol/L   Potassium 3.9 3.5 - 5.1 mmol/L   Chloride 104 98 - 111 mmol/L   CO2 28 22 - 32 mmol/L   Glucose, Bld 116 (H) 70 - 99 mg/dL   BUN 23 8 - 23 mg/dL   Creatinine, Ser 0.57 (L) 0.61 - 1.24 mg/dL   Calcium 9.6 8.9 - 10.3 mg/dL   Total Protein 7.4 6.5 - 8.1 g/dL   Albumin 3.0 (L) 3.5 - 5.0 g/dL   AST 30 15 - 41 U/L   ALT 26 0 - 44 U/L   Alkaline Phosphatase 136 (H) 38 - 126 U/L   Total Bilirubin 1.3 (H) 0.3 - 1.2 mg/dL   GFR calc non Af Amer >60 >60 mL/min   GFR calc Af Amer >60 >60 mL/min    Comment: (NOTE) The eGFR has been calculated using the CKD EPI  equation. This calculation has not been validated in all clinical situations. eGFR's persistently <60 mL/min signify possible Chronic Kidney Disease.    Anion gap 13 5 - 15    Comment: Performed at [facility], [place] [address], [place], [place] [address]  Urinalysis, Routine w reflex microscopic     Status: Abnormal   Collection Time: 08/21/18  2:56 PM  Result Value Ref Range   Color, Urine YELLOW YELLOW   APPearance CLEAR CLEAR   Specific Gravity, Urine 1.024 1.005 - 1.030   pH 5.0 5.0 - 8.0   Glucose, UA NEGATIVE NEGATIVE mg/dL   Hgb urine dipstick SMALL (A) NEGATIVE   Bilirubin Urine NEGATIVE NEGATIVE   Ketones, ur 20 (A) NEGATIVE mg/dL   Protein, ur NEGATIVE NEGATIVE mg/dL   Nitrite NEGATIVE NEGATIVE   Leukocytes, UA NEGATIVE NEGATIVE   RBC / HPF 11-20 0 - 5 RBC/hpf   WBC, UA 0-5 0 - 5 WBC/hpf   Bacteria, UA NONE SEEN NONE SEEN   Mucus PRESENT    Ca Oxalate Crys, UA PRESENT     Comment: Performed at [facility], [place] [address] [place], [place] [address]   No results found.  Pending Labs Unresulted Labs (From admission, onward)    Start     Ordered   08/21/18 1714  Urine culture  STAT,   STAT     08/21/18 1714   Signed and Held  HIV antibody (Routine Testing)  Once,   R     Signed and Held          Vitals/Pain Today's Vitals   08/21/18 1400 08/21/18 1500 08/21/18 1845 08/21/18 1941  BP: (!) 170/100 (!) 167/95 (!) 190/121   Pulse: 78 67 96   Resp:  18 18   Temp:      TempSrc:      SpO2: 97% (!) 85% 99%   Weight:      Height:      PainSc:    8     Isolation Precautions No active isolations  Medications Medications  morphine 2 MG/ML injection 2 mg (has no administration in time range)  morphine 2 MG/ML injection 1 mg (1 mg Intravenous Given 08/21/18 1454)  morphine 2 MG/ML injection 1 mg (1 mg Intravenous Given 08/21/18 1855)    Mobility walks with person assist , back problems noted chronic w device

## 2018-08-22 ENCOUNTER — Ambulatory Visit
Admission: RE | Admit: 2018-08-22 | Discharge: 2018-08-22 | Disposition: A | Discharge status: Home / Self Care | Payer: Medicaid Other | Source: Ambulatory Visit | Attending: Radiation Oncology | Admitting: Radiation Oncology

## 2018-08-22 ENCOUNTER — Other Ambulatory Visit: Payer: Self-pay

## 2018-08-22 DIAGNOSIS — M545 Low back pain: Secondary | ICD-10-CM | POA: Diagnosis not present

## 2018-08-22 DIAGNOSIS — C220 Liver cell carcinoma: Secondary | ICD-10-CM | POA: Diagnosis not present

## 2018-08-22 DIAGNOSIS — Z681 Body mass index (BMI) 19 or less, adult: Secondary | ICD-10-CM | POA: Diagnosis not present

## 2018-08-22 DIAGNOSIS — C7951 Secondary malignant neoplasm of bone: Secondary | ICD-10-CM | POA: Insufficient documentation

## 2018-08-22 DIAGNOSIS — Z8249 Family history of ischemic heart disease and other diseases of the circulatory system: Secondary | ICD-10-CM | POA: Diagnosis not present

## 2018-08-22 DIAGNOSIS — Z79899 Other long term (current) drug therapy: Secondary | ICD-10-CM | POA: Diagnosis not present

## 2018-08-22 DIAGNOSIS — Z79891 Long term (current) use of opiate analgesic: Secondary | ICD-10-CM | POA: Diagnosis not present

## 2018-08-22 DIAGNOSIS — M48061 Spinal stenosis, lumbar region without neurogenic claudication: Secondary | ICD-10-CM | POA: Diagnosis present

## 2018-08-22 DIAGNOSIS — G4700 Insomnia, unspecified: Secondary | ICD-10-CM | POA: Diagnosis present

## 2018-08-22 DIAGNOSIS — Z96652 Presence of left artificial knee joint: Secondary | ICD-10-CM | POA: Diagnosis present

## 2018-08-22 DIAGNOSIS — M8580 Other specified disorders of bone density and structure, unspecified site: Secondary | ICD-10-CM | POA: Diagnosis present

## 2018-08-22 DIAGNOSIS — G9589 Other specified diseases of spinal cord: Secondary | ICD-10-CM | POA: Diagnosis present

## 2018-08-22 DIAGNOSIS — Z515 Encounter for palliative care: Secondary | ICD-10-CM | POA: Diagnosis not present

## 2018-08-22 DIAGNOSIS — M1990 Unspecified osteoarthritis, unspecified site: Secondary | ICD-10-CM | POA: Diagnosis present

## 2018-08-22 DIAGNOSIS — K5900 Constipation, unspecified: Secondary | ICD-10-CM | POA: Diagnosis present

## 2018-08-22 DIAGNOSIS — F1011 Alcohol abuse, in remission: Secondary | ICD-10-CM

## 2018-08-22 DIAGNOSIS — F17210 Nicotine dependence, cigarettes, uncomplicated: Secondary | ICD-10-CM | POA: Diagnosis present

## 2018-08-22 DIAGNOSIS — Z510 Encounter for antineoplastic radiation therapy: Secondary | ICD-10-CM | POA: Insufficient documentation

## 2018-08-22 DIAGNOSIS — R32 Unspecified urinary incontinence: Secondary | ICD-10-CM | POA: Diagnosis present

## 2018-08-22 DIAGNOSIS — M4802 Spinal stenosis, cervical region: Secondary | ICD-10-CM | POA: Diagnosis present

## 2018-08-22 DIAGNOSIS — B1920 Unspecified viral hepatitis C without hepatic coma: Secondary | ICD-10-CM | POA: Diagnosis present

## 2018-08-22 DIAGNOSIS — G893 Neoplasm related pain (acute) (chronic): Secondary | ICD-10-CM | POA: Diagnosis not present

## 2018-08-22 DIAGNOSIS — X58XXXA Exposure to other specified factors, initial encounter: Secondary | ICD-10-CM | POA: Diagnosis present

## 2018-08-22 DIAGNOSIS — M25512 Pain in left shoulder: Secondary | ICD-10-CM | POA: Diagnosis present

## 2018-08-22 DIAGNOSIS — I10 Essential (primary) hypertension: Secondary | ICD-10-CM | POA: Diagnosis present

## 2018-08-22 DIAGNOSIS — G8929 Other chronic pain: Secondary | ICD-10-CM | POA: Diagnosis present

## 2018-08-22 DIAGNOSIS — M542 Cervicalgia: Secondary | ICD-10-CM | POA: Diagnosis not present

## 2018-08-22 DIAGNOSIS — F329 Major depressive disorder, single episode, unspecified: Secondary | ICD-10-CM

## 2018-08-22 DIAGNOSIS — S2232XA Fracture of one rib, left side, initial encounter for closed fracture: Secondary | ICD-10-CM | POA: Diagnosis present

## 2018-08-22 DIAGNOSIS — G4701 Insomnia due to medical condition: Secondary | ICD-10-CM | POA: Diagnosis not present

## 2018-08-22 DIAGNOSIS — M549 Dorsalgia, unspecified: Secondary | ICD-10-CM | POA: Diagnosis not present

## 2018-08-22 DIAGNOSIS — Z66 Do not resuscitate: Secondary | ICD-10-CM | POA: Diagnosis not present

## 2018-08-22 LAB — COMPREHENSIVE METABOLIC PANEL
ALBUMIN: 2.9 g/dL — AB (ref 3.5–5.0)
ALT: 24 U/L (ref 0–44)
AST: 25 U/L (ref 15–41)
Alkaline Phosphatase: 128 U/L — ABNORMAL HIGH (ref 38–126)
Anion gap: 11 (ref 5–15)
BUN: 22 mg/dL (ref 8–23)
CO2: 27 mmol/L (ref 22–32)
Calcium: 9.5 mg/dL (ref 8.9–10.3)
Chloride: 106 mmol/L (ref 98–111)
Creatinine, Ser: 0.59 mg/dL — ABNORMAL LOW (ref 0.61–1.24)
GFR calc Af Amer: >60 mL/min (ref >60)
GLUCOSE: 161 mg/dL — AB (ref 70–99)
POTASSIUM: 4.3 mmol/L (ref 3.5–5.1)
Sodium: 144 mmol/L (ref 135–145)
TOTAL PROTEIN: 7.2 g/dL (ref 6.5–8.1)
Total Bilirubin: 1.4 mg/dL — ABNORMAL HIGH (ref 0.3–1.2)

## 2018-08-22 LAB — CBC WITH DIFFERENTIAL/PLATELET
BASOS ABS: 0 10*3/uL (ref 0.0–0.1)
BASOS PCT: 0 %
EOS ABS: 0 10*3/uL (ref 0.0–0.7)
EOS PCT: 0 %
HCT: 44.8 % (ref 39.0–52.0)
Hemoglobin: 14.4 g/dL (ref 13.0–17.0)
LYMPHS PCT: 11 %
Lymphs Abs: 0.7 10*3/uL (ref 0.7–4.0)
MCH: 27.4 pg (ref 26.0–34.0)
MCHC: 32.1 g/dL (ref 30.0–36.0)
MCV: 85.2 fL (ref 78.0–100.0)
MONO ABS: 0.2 10*3/uL (ref 0.1–1.0)
Monocytes Relative: 2 %
Neutro Abs: 5.7 10*3/uL (ref 1.7–7.7)
Neutrophils Relative %: 87 %
Platelets: 218 10*3/uL (ref 150–400)
RBC: 5.26 MIL/uL (ref 4.22–5.81)
RDW: 17 % — AB (ref 11.5–15.5)
WBC: 6.6 10*3/uL (ref 4.0–10.5)

## 2018-08-22 LAB — MRSA PCR SCREENING: MRSA BY PCR: NEGATIVE

## 2018-08-22 LAB — PHOSPHORUS: Phosphorus: 3.6 mg/dL (ref 2.5–4.6)

## 2018-08-22 LAB — MAGNESIUM: MAGNESIUM: 2 mg/dL (ref 1.7–2.4)

## 2018-08-22 MED ORDER — HYDRALAZINE HCL 20 MG/ML IJ SOLN
10.0000 mg | Freq: Four times a day (QID) | INTRAMUSCULAR | Status: DC | PRN
  Administered 2018-08-22 – 2018-08-23 (×3): 10 mg via INTRAVENOUS
  Filled 2018-08-22 (×3): qty 1

## 2018-08-22 MED ORDER — MORPHINE SULFATE (PF) 2 MG/ML IV SOLN
2.0000 mg | INTRAVENOUS | Status: DC | PRN
  Administered 2018-08-22 – 2018-08-23 (×8): 2 mg via INTRAVENOUS
  Filled 2018-08-22 (×8): qty 1

## 2018-08-22 NOTE — Progress Notes (Addendum)
PROGRESS NOTE    Kyle Mejia  CVE:[id number] DOB: Apr 08, 1956 DOA: 08/21/2018 PCP: Azzie Glatter, FNP   Brief Narrative: HPI per Dr. Jenetta Downer on 08/21/18 Kyle Mejia is a 62 y.o. 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  Patient has been receiving palliative radiation therapy to spine for known metastasis.  Patient denies actual lower extremity weakness but states the pain in his back room prevents him from walking.  Reports urinary incontinence and constipation.   ED Course: Elevated blood pressure systolic to 025E, otherwise stable.  Bladder scan- 74cc urine.  patient was given IV morphine with persistence of pain in ED. EDP talked to Dr. Tammi Klippel on call for radiation oncology, with plans to move patients already scheduled radiation therapy session for Thursday 08/24/18 to Tuesday 08/22/18 or Wednesday as outpatient.  But attempts to ambulate patient in the ED was unsuccessful, patient's still had persistent uncontrolled pain.  Hospitalist was called to admit.  Assessment & Plan:   Active Problems:   History of alcohol abuse   Depression   Hepatocellular carcinoma (HCC)   Intractable back pain  Intractable Back and Neck Pain  -2/2 mets to spine.   -MRI spine 08/13/2018-T2 and L2 lesions, MRI 07/31/2018 95mm C2 metastasis, multilevel degenerative changes of cervical spine, severe spinal canal stenosis C5-C6 and cord edema/myelomalacia. -IV morphine for pain and adjust to q3hprn -IV Decadron 4 mg every 8 hourly -Radiation Oncology Dr. Devra Dopp and will see the patient in consultation and likely start Radiation tomorrow  -Will call Palliative Care for further Pain Control   Hepatocellular cancer/Hx of Hepatitis C/Hyperbilirubinemia -Patient was previously on  Sorafenib and no longer. -Follows with Dr. Learta Codding.  -C/w Cholestyramine 4 grams po Every morning  -Currently on palliative radiation therapy and have asked Dr. Tammi Klippel to do Radiation earlier.  History of alcohol abuse -Denies alcoholic drink in the past year.  Resides in a nursing home.  Hypertension -Elevated in the setting of Pain -Cont home medications with Clonidine, Lisinopril, Metoprolol. -Added PRN hydralazine for elevated blood pressure  Depression -Continue home Paroxetine, and clonazepam  Hyperglycemia  -Likely reactive -Blood Sugar was 116 on Admission and repeat on CMP this AM was 161 -Last HbA1c was 5.2 on 06/27/18 -Continue to Monitor Blood Sugars and place on Sensitive Novolog SSI if Necessary   DVT prophylaxis: Enoxaparin 40 mg sq q24h Code Status: FULL CODE Family Communication: No family present at bedside Disposition Plan: Anticipate discharge to skilled nursing facility when medically stable and pain is improved  Consultants:   Radiation Oncology   Procedures: None   Antimicrobials:  Anti-infectives (From admission, onward)   None     Subjective: And examined at bedside states that his pain was still very uncontrolled.  No chest pain, lightheadedness or dizziness.  Still felt bad and did not really want to move it he states that laying still he is not in much pain.  No chest pain, lightheadedness or dizziness.  No other concerns or claims at this time but was frustrated about not knowing the plan of care.  Objective: Vitals:   08/22/18 0532 08/22/18 0644 08/22/18 1337 08/22/18 1622  BP: (!) 180/119 (!) 153/100 (!) 146/101 (!) 159/109  Pulse:   95 96  Resp:   16 16  Temp:   98.8 F (37.1  C) 97.9 F (36.6 C)  TempSrc:   Oral Oral  SpO2:   100% 97%  Weight:      Height:        Intake/Output Summary (Last 24 hours) at 08/22/2018 1801 Last data filed at 08/22/2018 1436 Gross per 24 hour  Intake 240 ml  Output 300 ml  Net -60 ml   Filed  Weights   08/21/18 1320  Weight: 55.3 kg   Examination: Physical Exam:  Constitutional: Thin Cachectic frail ill appearing Caucasian male appears calm but uncomfortable Eyes: Lids and conjunctivae normal, sclerae anicteric  ENMT: External Ears, Nose appear normal. Grossly normal hearing. Neck: Appears normal, supple, no cervical masses, normal ROM, no appreciable thyromegaly; no JVD Respiratory: Diminished to auscultation bilaterally, no wheezing, rales, rhonchi or crackles. Normal respiratory effort and patient is not tachypenic. No accessory muscle use.  Cardiovascular: RRR, no murmurs / rubs / gallops. S1 and S2 auscultated. No LE extremity edema. Abdomen: Soft, non-tender, non-distended. No masses palpated. No appreciable hepatosplenomegaly. Bowel sounds positive x3.  GU: Deferred. Musculoskeletal: No clubbing / cyanosis of digits/nails. Normal strength and muscle tone.  Skin: No rashes, lesions, ulcers on a limited skin eval. No induration; Warm and dry.  Neurologic: CN 2-12 grossly intact with no focal deficits.Romberg sign and cerebellar reflexes not assessed.  Psychiatric: Normal judgment and insight. Alert and oriented x 3. Anxious mood and appropriate affect.   Data Reviewed: I have personally reviewed following labs and imaging studies  CBC: Recent Labs  Lab 08/21/18 1456 08/22/18 1026  WBC 9.9 6.6  NEUTROABS 6.7 5.7  HGB 14.2 14.4  HCT 44.4 44.8  MCV 85.7 85.2  PLT 210 629   Basic Metabolic Panel: Recent Labs  Lab 08/21/18 1456 08/22/18 1026  NA 145 144  K 3.9 4.3  CL 104 106  CO2 28 27  GLUCOSE 116* 161*  BUN 23 22  CREATININE 0.57* 0.59*  CALCIUM 9.6 9.5  MG  --  2.0  PHOS  --  3.6   GFR: Estimated Creatinine Clearance: 73 mL/min (A) (by C-G formula based on SCr of 0.59 mg/dL (L)). Liver Function Tests: Recent Labs  Lab 08/21/18 1456 08/22/18 1026  AST 30 25  ALT 26 24  ALKPHOS 136* 128*  BILITOT 1.3* 1.4*  PROT 7.4 7.2  ALBUMIN 3.0* 2.9*    No results for input(s): LIPASE, AMYLASE in the last 168 hours. No results for input(s): AMMONIA in the last 168 hours. Coagulation Profile: No results for input(s): INR, PROTIME in the last 168 hours. Cardiac Enzymes: No results for input(s): CKTOTAL, CKMB, CKMBINDEX, TROPONINI in the last 168 hours. BNP (last 3 results) No results for input(s): PROBNP in the last 8760 hours. HbA1C: No results for input(s): HGBA1C in the last 72 hours. CBG: No results for input(s): GLUCAP in the last 168 hours. Lipid Profile: No results for input(s): CHOL, HDL, LDLCALC, TRIG, CHOLHDL, LDLDIRECT in the last 72 hours. Thyroid Function Tests: No results for input(s): TSH, T4TOTAL, FREET4, T3FREE, THYROIDAB in the last 72 hours. Anemia Panel: No results for input(s): VITAMINB12, FOLATE, FERRITIN, TIBC, IRON, RETICCTPCT in the last 72 hours. Sepsis Labs: No results for input(s): PROCALCITON, LATICACIDVEN in the last 168 hours.  No results found for this or any previous visit (from the past 240 hour(s)).   Radiology Studies: No results found.  Scheduled Meds: . cholestyramine  4 g Oral q morning - 10a  . clonazePAM  0.5 mg Oral TID  . cloNIDine  0.2 mg  Oral BID  . dexamethasone  4 mg Intravenous Q8H  . enoxaparin (LOVENOX) injection  40 mg Subcutaneous QHS  . feeding supplement (ENSURE ENLIVE)  237 mL Oral TID BM  . folic acid  1 mg Oral Daily  . lisinopril  10 mg Oral Daily  . magnesium oxide  400 mg Oral BID  . metoprolol succinate  25 mg Oral Daily  . pantoprazole  40 mg Oral Daily  . PARoxetine  20 mg Oral Daily  . pentoxifylline  400 mg Oral TID WC  . polyethylene glycol  17 g Oral BID  . potassium chloride SA  20 mEq Oral Daily   Continuous Infusions:   LOS: 0 days    Kerney Elbe, DO [facility] PAGER is on AMION  If 7PM-7AM, please contact night-coverage [email or web address] Password [email or web address] 08/22/2018, 6:01 PM

## 2018-08-23 ENCOUNTER — Ambulatory Visit
Admission: RE | Admit: 2018-08-23 | Discharge: 2018-08-23 | Disposition: A | Discharge status: Home / Self Care | Payer: Medicaid Other | Source: Ambulatory Visit | Attending: Radiation Oncology | Admitting: Radiation Oncology

## 2018-08-23 VITALS — BP 144/98 | HR 112 | Temp 98.3°F | Resp 20

## 2018-08-23 DIAGNOSIS — C220 Liver cell carcinoma: Secondary | ICD-10-CM

## 2018-08-23 DIAGNOSIS — M549 Dorsalgia, unspecified: Secondary | ICD-10-CM

## 2018-08-23 DIAGNOSIS — C7951 Secondary malignant neoplasm of bone: Secondary | ICD-10-CM

## 2018-08-23 DIAGNOSIS — Z66 Do not resuscitate: Secondary | ICD-10-CM

## 2018-08-23 DIAGNOSIS — Z515 Encounter for palliative care: Secondary | ICD-10-CM

## 2018-08-23 LAB — URINE CULTURE

## 2018-08-23 MED ORDER — MORPHINE SULFATE (PF) 2 MG/ML IV SOLN
2.0000 mg | INTRAVENOUS | Status: DC | PRN
  Administered 2018-08-23 – 2018-08-24 (×4): 2 mg via INTRAVENOUS
  Filled 2018-08-23 (×4): qty 1

## 2018-08-23 MED ORDER — DEXAMETHASONE SODIUM PHOSPHATE 4 MG/ML IJ SOLN
4.0000 mg | Freq: Four times a day (QID) | INTRAMUSCULAR | Status: DC
  Administered 2018-08-23 – 2018-08-25 (×7): 4 mg via INTRAVENOUS
  Filled 2018-08-23 (×6): qty 1

## 2018-08-23 MED ORDER — SENNA 8.6 MG PO TABS
1.0000 | ORAL_TABLET | Freq: Every day | ORAL | Status: DC
  Administered 2018-08-23 – 2018-08-24 (×2): 8.6 mg via ORAL
  Filled 2018-08-23 (×2): qty 1

## 2018-08-23 MED ORDER — MORPHINE SULFATE 4 MG/ML IJ SOLN
2.0000 mg | Freq: Once | INTRAMUSCULAR | Status: AC
  Administered 2018-08-23: 2 mg via INTRAVENOUS
  Filled 2018-08-23: qty 1

## 2018-08-23 MED ORDER — LIDOCAINE 5 % EX PTCH
1.0000 | MEDICATED_PATCH | CUTANEOUS | Status: DC
  Administered 2018-08-23 – 2018-08-24 (×2): 1 via TRANSDERMAL
  Filled 2018-08-23 (×3): qty 1

## 2018-08-23 MED ORDER — OXYCODONE HCL 5 MG PO TABS
10.0000 mg | ORAL_TABLET | ORAL | Status: DC | PRN
  Administered 2018-08-23 – 2018-08-25 (×4): 10 mg via ORAL
  Filled 2018-08-23 (×4): qty 2
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
## 2018-08-23 NOTE — Progress Notes (Signed)
[facility] Therapy Treatment Record Phone [phone]   Radiation Therapy was administered to Kyle Mejia on: 08/23/2018  11:38 AM and was treatment # 2 out of a planned course of 10 treatments.  Radiation Treatment  1). Beam photons with 6-10 energy  2). Brachytherapy None  3). Stereotactic Radiosurgery None  4). Other Radiation None     Auther Lyerly J Niralya Ohanian, RT

## 2018-08-23 NOTE — Progress Notes (Signed)
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  Kyle Mejia has been receiving palliative radiation therapy to spine for better stasis.    Subjective   Kyle Mejia underwent radiation treatment today   Assessment/Plan:     1. Intractable back pain-secondary to mets to spine, MRI spine showed multilevel degeneration changes of cervical spine, severe spinal canal stenosis C5-C6 and cord edema myelomalacia.  Continue morphine as needed, Decadron as needed.  Kyle Mejia getting radiation treatment #2 today.  2. Hepatocellular cancer/history of hepatitis C-follows Dr. Malachy Mood.  Continue Listermint 4 g p.o. every evening.  Currently on palliative radiation treatment.  Palliative care consulted.  3. History of alcohol abuse-denies alcohol drinking past 1 year.  Kyle Mejia resides at skilled facility.  4. Hypertension-continue home medications including clonidine, lisinopril, metoprolol.  PRN hydralazine.  5. Depression-continue paroxetine, clonazepam.     CBG: No results for input(s): GLUCAP in the last 168 hours.  CBC: Recent Labs  Lab 08/21/18 1456 08/22/18 1026  WBC 9.9 6.6  NEUTROABS 6.7 5.7  HGB 14.2 14.4  HCT 44.4 44.8  MCV 85.7 85.2  PLT 210 193    Basic Metabolic Panel: Recent Labs  Lab 08/21/18 1456 08/22/18 1026  NA 145 144  K 3.9 4.3  CL 104 106  CO2 28 27  GLUCOSE 116* 161*  BUN 23 22  CREATININE 0.57* 0.59*  CALCIUM 9.6 9.5  MG  --  2.0  PHOS  --  3.6     DVT prophylaxis: Lovenox  Code Status: DNR  Family Communication: No family at bedside  Disposition Plan: likely home when medically ready for discharge   Consultants:  Radiation  oncology  Procedures:  None   Antibiotics:   Anti-infectives (From admission, onward)   None       Objective   Vitals:   08/22/18 2205 08/23/18 0552 08/23/18 1437 08/23/18 1746  BP: (!) 156/98 (!) 162/103 (!) 162/101 (!) 141/86  Pulse: (!) 115 (!) 127 (!) 125 (!) 128  Resp: 18 18 15    Temp: 98.2 F (36.8 C) 98.4 F (36.9 C) 98.3 F (36.8 C)   TempSrc: Oral Oral Oral   SpO2: 97% 96% 97%   Weight:      Height:        Intake/Output Summary (Last 24 hours) at 08/23/2018 1856 Last data filed at 08/23/2018 1500 Gross per 24 hour  Intake 420 ml  Output 380 ml  Net 40 ml   Filed Weights   08/21/18 1320  Weight: 55.3 kg     Physical Examination:    General: Appears in no acute distress  Cardiovascular: S1-S2, regular  Respiratory: Clear to auscultation bilaterally  Abdomen: Soft, nontender, no organomegaly  Extremities: No edema in the lower extremities  Neurologic: Alert, oriented x3     Data Reviewed: I have personally reviewed following labs and imaging studies   Recent Results (from the past 240 hour(s))  Urine culture     Status: Abnormal   Collection Time: 08/21/18  2:56 PM  Result Value Ref Range Status   Specimen Description   Final    URINE, CLEAN CATCH Performed at [facility], [place] [address], [place], [place] [address]    Special Requests   Final  NONE Performed at [facility], [place] [address], [place], [place] [address]    Culture MULTIPLE SPECIES PRESENT, SUGGEST RECOLLECTION (A)  Final   Report Status 08/23/2018 FINAL  Final  MRSA PCR Screening     Status: None   Collection Time: 08/22/18  7:12 PM  Result Value Ref Range Status   MRSA by PCR NEGATIVE NEGATIVE Final    Comment:        The GeneXpert MRSA Assay (FDA approved for NASAL specimens only), is one component of a comprehensive MRSA colonization surveillance program. It is not intended to diagnose MRSA infection nor to guide  or monitor treatment for MRSA infections. Performed at [facility], [place] [address] [place], [place] [address]      Liver Function Tests: Recent Labs  Lab 08/21/18 1456 08/22/18 1026  AST 30 25  ALT 26 24  ALKPHOS 136* 128*  BILITOT 1.3* 1.4*  PROT 7.4 7.2  ALBUMIN 3.0* 2.9*   No results for input(s): LIPASE, AMYLASE in the last 168 hours. No results for input(s): AMMONIA in the last 168 hours.  Cardiac Enzymes: No results for input(s): CKTOTAL, CKMB, CKMBINDEX, TROPONINI in the last 168 hours. BNP (last 3 results) No results for input(s): BNP in the last 8760 hours.  ProBNP (last 3 results) No results for input(s): PROBNP in the last 8760 hours.    Studies: No results found.  Scheduled Meds: . cholestyramine  4 g Oral q morning - 10a  . clonazePAM  0.5 mg Oral TID  . cloNIDine  0.2 mg Oral BID  . dexamethasone  4 mg Intravenous Q6H  . enoxaparin (LOVENOX) injection  40 mg Subcutaneous QHS  . feeding supplement (ENSURE ENLIVE)  237 mL Oral TID BM  . folic acid  1 mg Oral Daily  . lidocaine  1 patch Transdermal Q24H  . lisinopril  10 mg Oral Daily  . magnesium oxide  400 mg Oral BID  . metoprolol succinate  25 mg Oral Daily  . pantoprazole  40 mg Oral Daily  . PARoxetine  20 mg Oral Daily  . pentoxifylline  400 mg Oral TID WC  . polyethylene glycol  17 g Oral BID  . potassium chloride SA  20 mEq Oral Daily  . senna  1 tablet Oral QHS      Time spent: 25 min  [facility] Pager [phone]. If 7PM-7AM, please contact night-coverage at [email or web address], Office  [phone]  password [email or web address]  08/23/2018, 6:56 PM  LOS: 1 day

## 2018-08-24 ENCOUNTER — Ambulatory Visit: Payer: Medicaid Other | Admitting: Radiation Oncology

## 2018-08-24 ENCOUNTER — Ambulatory Visit
Admission: RE | Admit: 2018-08-24 | Discharge: 2018-08-24 | Disposition: A | Discharge status: Home / Self Care | Payer: Medicaid Other | Source: Ambulatory Visit | Attending: Radiation Oncology | Admitting: Radiation Oncology

## 2018-08-24 DIAGNOSIS — M545 Low back pain: Secondary | ICD-10-CM

## 2018-08-24 DIAGNOSIS — G4701 Insomnia due to medical condition: Secondary | ICD-10-CM

## 2018-08-24 MED ORDER — MORPHINE SULFATE ER 15 MG PO TBCR
15.0000 mg | EXTENDED_RELEASE_TABLET | Freq: Two times a day (BID) | ORAL | Status: DC
  Administered 2018-08-24 – 2018-08-25 (×3): 15 mg via ORAL
  Filled 2018-08-24 (×3): qty 1

## 2018-08-24 MED ORDER — MIRTAZAPINE 15 MG PO TABS
7.5000 mg | ORAL_TABLET | Freq: Every day | ORAL | Status: DC
  Administered 2018-08-24: 7.5 mg via ORAL
  Filled 2018-08-24: qty 1

## 2018-08-24 NOTE — Progress Notes (Signed)
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  Patient has been receiving palliative radiation therapy to spine for better stasis.    Subjective   Patient seen and examined, complains of back pain.  Getting palliative radiation treatment.   Assessment/Plan:     1. Intractable back pain-secondary to mets to spine, MRI spine showed multilevel degeneration changes of cervical spine, severe spinal canal stenosis C5-C6 and cord edema myelomalacia.  Continue morphine as needed, Decadron as needed.  Patient getting radiation treatment #3 today.  Will start long-acting MS Contin 15 mg p.o. every 12 hours, continue oxycodone 10 mg every 4 hours as needed  2. Hepatocellular cancer/history of hepatitis C-follows Dr. Malachy Mood.  Continue cholestyramine 4 g p.o. every evening.  Currently on palliative radiation treatment.  Palliative care consulted.  3. History of alcohol abuse-denies alcohol drinking past 1 year.  Patient resides at skilled facility.  4. Hypertension-continue home medications including clonidine, lisinopril, metoprolol.  PRN hydralazine.  5. Depression-continue paroxetine, clonazepam.     CBG: No results for input(s): GLUCAP in the last 168 hours.  CBC: Recent Labs  Lab 08/21/18 1456 08/22/18 1026  WBC 9.9 6.6  NEUTROABS 6.7 5.7  HGB 14.2 14.4  HCT 44.4 44.8  MCV 85.7 85.2  PLT 210 035    Basic Metabolic Panel: Recent Labs  Lab 08/21/18 1456 08/22/18 1026  NA 145 144  K 3.9 4.3  CL 104 106  CO2 28 27  GLUCOSE 116* 161*  BUN 23 22  CREATININE 0.57* 0.59*  CALCIUM 9.6 9.5  MG  --  2.0  PHOS  --  3.6     DVT prophylaxis: Lovenox  Code Status: DNR  Family Communication: No  family at bedside  Disposition Plan: likely home when medically ready for discharge   Consultants:  Radiation oncology  Procedures:  None   Antibiotics:   Anti-infectives (From admission, onward)   None       Objective   Vitals:   08/23/18 1746 08/23/18 1950 08/24/18 0545 08/24/18 1404  BP: (!) 141/86 131/88 (!) 142/88 132/77  Pulse: (!) 128 (!) 106 (!) 102 65  Resp:  17 18 18   Temp:  98.1 F (36.7 C) 98.4 F (36.9 C) 97.6 F (36.4 C)  TempSrc:  Oral Oral Oral  SpO2:  98% 99% 100%  Weight:      Height:        Intake/Output Summary (Last 24 hours) at 08/24/2018 1605 Last data filed at 08/24/2018 0910 Gross per 24 hour  Intake -  Output 200 ml  Net -200 ml   Filed Weights   08/21/18 1320  Weight: 55.3 kg     Physical Examination: ,  Neck: Supple, no deformities, masses, or tenderness Lungs: Normal respiratory effort, bilateral clear to auscultation, no crackles or wheezes.  Heart: Regular rate and rhythm, S1 and S2 normal, no murmurs, rubs auscultated Abdomen: BS normoactive,soft,nondistended,non-tender to palpation,no organomegaly Extremities: No pretibial edema, no erythema, no cyanosis, no clubbing Neuro : Alert and oriented to time, place and person, No focal deficits    Data Reviewed: I have personally reviewed following labs and imaging studies   Recent Results (from the past 240 hour(s))  Urine culture  Status: Abnormal   Collection Time: 08/21/18  2:56 PM  Result Value Ref Range Status   Specimen Description   Final    URINE, CLEAN CATCH Performed at [facility], [place] [address], [place], [place] [address]    Special Requests   Final    NONE Performed at [facility], [place] [address] [place], [place] [address]    Culture MULTIPLE SPECIES PRESENT, SUGGEST RECOLLECTION (A)  Final   Report Status 08/23/2018 FINAL  Final  MRSA PCR Screening     Status: None   Collection Time: 08/22/18  7:12 PM   Result Value Ref Range Status   MRSA by PCR NEGATIVE NEGATIVE Final    Comment:        The GeneXpert MRSA Assay (FDA approved for NASAL specimens only), is one component of a comprehensive MRSA colonization surveillance program. It is not intended to diagnose MRSA infection nor to guide or monitor treatment for MRSA infections. Performed at [facility], [place] [address] [place], [place] [address]      Liver Function Tests: Recent Labs  Lab 08/21/18 1456 08/22/18 1026  AST 30 25  ALT 26 24  ALKPHOS 136* 128*  BILITOT 1.3* 1.4*  PROT 7.4 7.2  ALBUMIN 3.0* 2.9*   No results for input(s): LIPASE, AMYLASE in the last 168 hours. No results for input(s): AMMONIA in the last 168 hours.  Cardiac Enzymes: No results for input(s): CKTOTAL, CKMB, CKMBINDEX, TROPONINI in the last 168 hours. BNP (last 3 results) No results for input(s): BNP in the last 8760 hours.  ProBNP (last 3 results) No results for input(s): PROBNP in the last 8760 hours.    Studies: No results found.  Scheduled Meds: . cholestyramine  4 g Oral q morning - 10a  . clonazePAM  0.5 mg Oral TID  . cloNIDine  0.2 mg Oral BID  . dexamethasone  4 mg Intravenous Q6H  . enoxaparin (LOVENOX) injection  40 mg Subcutaneous QHS  . feeding supplement (ENSURE ENLIVE)  237 mL Oral TID BM  . folic acid  1 mg Oral Daily  . lidocaine  1 patch Transdermal Q24H  . lisinopril  10 mg Oral Daily  . magnesium oxide  400 mg Oral BID  . metoprolol succinate  25 mg Oral Daily  . mirtazapine  7.5 mg Oral QHS  . morphine  15 mg Oral Q12H  . pantoprazole  40 mg Oral Daily  . PARoxetine  20 mg Oral Daily  . pentoxifylline  400 mg Oral TID WC  . polyethylene glycol  17 g Oral BID  . potassium chloride SA  20 mEq Oral Daily  . senna  1 tablet Oral QHS      Time spent: 25 min  [facility] Pager [phone]. If 7PM-7AM, please contact night-coverage at [email or web address], Office   [phone]  password [email or web address]  08/24/2018, 4:05 PM  LOS: 2 days

## 2018-08-24 NOTE — Progress Notes (Signed)
Pt resident of [facility]. Has palliative care following outpatient per facility resident care director Santiago Glad, will continue at DC. Provided Santiago Glad with pt's radiation schedule in order to facilitate their transporting him to remaining appointments at [facility]. Will provide updated FL2 and assist with coordinating at DC.  Sharren Bridge, MSW, LCSW Clinical Social Work 08/24/2018 [phone]

## 2018-08-24 NOTE — Progress Notes (Signed)
[facility] Therapy Treatment Record Phone [phone]   [id number] Therapy was administered to Kyle Mejia on: 08/24/2018  2:54 PM and was treatment # 3 out of a planned course of 10 treatments.  Radiation Treatment  1). Beam photons with 6-10 energy  2). Brachytherapy None  3). Stereotactic Radiosurgery None  4). Other Radiation None     Trudee Kuster, RT (T)
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
## 2018-08-24 NOTE — Progress Notes (Signed)
Initial Nutrition Assessment  INTERVENTION:   Continue Ensure Enlive po TID, each supplement provides 350 kcal and 20 grams of protein  NUTRITION DIAGNOSIS:   Increased nutrient needs related to cancer and cancer related treatments as evidenced by estimated needs.  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  Patient has been receiving palliative radiation therapy to spine for better stasis.  Patient not in room at time of visit, pt at radiation therapy. Spoke with nurse tech, reports patient is in a lot of pain and doesn't eat much. Tries to drink Ensure supplements. Will continue supplements if pt is willing to drink them.  Palliative care following for goals of care. Will monitor for [place] decisions.   Per weight records, pt has lost 11 lb since 7/5 (8% wt loss x 3 months, significant for time frame).   Medications: Folic acid tablet daily, MAG-OX tablet BID, K-DUR tablet, Senokot tablet Labs reviewed:  Mg/Phos WNL  NUTRITION - FOCUSED PHYSICAL EXAM:  Patient was out of room for radiation therapy.  Diet Order:   Diet Order            Diet Heart Room service appropriate? Yes; Fluid consistency: Thin  Diet effective now              EDUCATION NEEDS:   Not appropriate for education at this time  Skin:  Skin Assessment: Reviewed RN Assessment  Last BM:  9/29  Height:   Ht Readings from Last 1 Encounters:  08/21/18 5\' 8"  (1.727 m)    Weight:   Wt Readings from Last 1 Encounters:  08/21/18 55.3 kg    Ideal Body Weight:  70 kg  BMI:  Body mass index is 18.55 kg/m.  Estimated  Nutritional Needs:   Kcal:  8309-4076  Protein:  85-95g  Fluid:  2L/day  Clayton Bibles, MS, RD, LDN [facility] Pager: [phone] After Hours Pager: [phone]

## 2018-08-25 ENCOUNTER — Telehealth: Payer: Self-pay | Admitting: Nurse Practitioner

## 2018-08-25 ENCOUNTER — Ambulatory Visit: Payer: Medicaid Other

## 2018-08-25 ENCOUNTER — Ambulatory Visit
Admission: RE | Admit: 2018-08-25 | Discharge: 2018-08-25 | Disposition: A | Discharge status: Home / Self Care | Payer: Medicaid Other | Source: Ambulatory Visit | Attending: Radiation Oncology | Admitting: Radiation Oncology

## 2018-08-25 LAB — CBC
HEMATOCRIT: 46 % (ref 39.0–52.0)
Hemoglobin: 14.2 g/dL (ref 13.0–17.0)
MCH: 27 pg (ref 26.0–34.0)
MCHC: 30.9 g/dL (ref 30.0–36.0)
MCV: 87.5 fL (ref 78.0–100.0)
Platelets: 237 10*3/uL (ref 150–400)
RBC: 5.26 MIL/uL (ref 4.22–5.81)
RDW: 17.8 % — ABNORMAL HIGH (ref 11.5–15.5)
WBC: 11.6 10*3/uL — ABNORMAL HIGH (ref 4.0–10.5)

## 2018-08-25 LAB — BASIC METABOLIC PANEL
ANION GAP: 7 (ref 5–15)
BUN: 48 mg/dL — ABNORMAL HIGH (ref 8–23)
CO2: 34 mmol/L — ABNORMAL HIGH (ref 22–32)
Calcium: 9.6 mg/dL (ref 8.9–10.3)
Chloride: 101 mmol/L (ref 98–111)
Creatinine, Ser: 0.66 mg/dL (ref 0.61–1.24)
GFR calc Af Amer: >60 mL/min (ref >60)
GLUCOSE: 109 mg/dL — AB (ref 70–99)
Potassium: 5.1 mmol/L (ref 3.5–5.1)
SODIUM: 142 mmol/L (ref 135–145)

## 2018-08-25 MED ORDER — DEXAMETHASONE 4 MG PO TABS: ORAL_TABLET | ORAL | 0 refills | Status: AC

## 2018-08-25 MED ORDER — MORPHINE SULFATE ER 15 MG PO TBCR: 15.0000 mg | EXTENDED_RELEASE_TABLET | Freq: Two times a day (BID) | ORAL | 0 refills | Status: AC

## 2018-08-25 MED ORDER — POLYETHYLENE GLYCOL 3350 17 G PO PACK: 17.0000 g | PACK | Freq: Two times a day (BID) | ORAL | 0 refills | Status: AC

## 2018-08-25 MED ORDER — MIRTAZAPINE 7.5 MG PO TABS: 7.5000 mg | ORAL_TABLET | Freq: Every day | ORAL | 2 refills | Status: AC

## 2018-08-25 MED ORDER — OXYCODONE HCL 5 MG PO TABS: 5.0000 mg | ORAL_TABLET | ORAL | 0 refills | Status: AC | PRN

## 2018-08-25 MED ORDER — SENNA 8.6 MG PO TABS: 1.0000 | ORAL_TABLET | Freq: Every day | ORAL | 0 refills | Status: AC

## 2018-08-25 MED ORDER — CLONAZEPAM 0.5 MG PO TABS: 0.5000 mg | ORAL_TABLET | Freq: Three times a day (TID) | ORAL | 0 refills | Status: AC

## 2018-08-25 NOTE — Discharge Summary (Signed)
Physician Discharge Summary  Kyle Mejia UDJ:[id number] DOB: 1956-01-25 DOA: 08/21/2018  PCP: Azzie Glatter, FNP  Admit date: 08/21/2018 Discharge date: 08/25/2018  Time spent: 45* minutes  Recommendations for Outpatient Follow-up:  1. Take Decadron 4 mg po bid x 5 days, then Decadron 4 mg po daily x 5 days then stop 2. 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  Patient has been receiving palliative radiation therapy to spine for better stasis.  Hospital Course:   1. Intractable back pain-secondary to mets to spine, MRI spine showed multilevel degeneration changes of cervical spine, severe spinal canal stenosis C5-C6 and cord edema myelomalacia. , Decadron as needed.  Patient getting radiation treatment #3 today. Patient started on MS contin 15 mg po bid, and pain is much better controlled. Will discharge on MS contin 15 mg po bid and Oxycodone 5 mg po q 4 hr prn.   2. Hepatocellular cancer/history of hepatitis C-follows Dr. Benay Spice.  Continue Cholestyraminet 4 g p.o. every evening.  Currently on palliative radiation treatment.    3. History of alcohol abuse-denies alcohol drinking past 1 year.  Patient resides at ALF.  4. Hypertension-continue home medications including clonidine, lisinopril, metoprolol.   5. Depression-continue paroxetine, clonazepam. Also added Remeron  6. Goals of care- Discussed with patients sisters on phone  and patient regarding hospice. He agrees to  consider Hospice if pain not adequately controlled with above measures.   Procedures:  Palliative radiation treatments  Consultations:  Palliative care  Radiation oncology  Discharge Exam: Vitals:   08/24/18 2036 08/25/18 0558  BP: 134/76 131/74  Pulse: 70 64  Resp: 18 17  Temp: 97.7 F (36.5 C) 98 F (36.7 C)  SpO2: 98% 97%    General: Appears in no acute distress Cardiovascular: S1S2 RRR Respiratory: Clear bilaterally  Discharge Instructions   Discharge Instructions    Diet - low sodium heart healthy   Complete by:  As directed    Increase activity slowly   Complete by:  As directed      Allergies as of 08/25/2018   No Known Allergies     Medication List    STOP taking these medications   escitalopram 10 MG tablet Commonly known as:  LEXAPRO   HYDROcodone-acetaminophen 5-325 MG tablet Commonly known as:  NORCO/VICODIN   hydrOXYzine 50 MG tablet Commonly known as:  ATARAX/VISTARIL   LORazepam 0.5 MG tablet Commonly known as:  ATIVAN   mupirocin ointment 2 % Commonly known as:  BACTROBAN   potassium chloride SA 20 MEQ tablet Commonly known as:  K-DUR,KLOR-CON   SORAfenib 200 MG tablet Commonly known as:  NEXAVAR     TAKE these medications   albuterol 108 (90 Base) MCG/ACT inhaler Commonly known as:  PROVENTIL HFA;VENTOLIN HFA Inhale 2 puffs into the lungs every 6 (six) hours as needed for wheezing or shortness of breath.   cholestyramine 4 g packet Commonly known as:  QUESTRAN Take 4 g by mouth every morning. Mix in 2-6  oz. Of water or juice and drink once daily   clonazePAM 0.5 MG tablet Commonly known as:  KLONOPIN Take 1 tablet (0.5 mg total) by mouth 3 (three) times daily.   cloNIDine 0.2 MG tablet Commonly known as:  CATAPRES Take 0.2 mg by mouth 2 (two) times daily.   dexamethasone 4 MG tablet Commonly known as:  DECADRON Take 1 tablet (4 mg total) by mouth 2 (two) times daily for 5 days, THEN 1 tablet (4 mg total) daily for  5 days. Start taking on:  08/25/2018   dextromethorphan-guaiFENesin 30-600 MG 12hr tablet Commonly known as:  MUCINEX DM Take 1 tablet by mouth 2 (two) times daily.   feeding supplement (ENSURE COMPLETE) Liqd Take 237 mLs by mouth 3 (three) times daily between meals.   folic acid 1 MG tablet Commonly known as:  FOLVITE Take 1 tablet (1 mg total) by mouth daily.   lisinopril 10 MG tablet Commonly known as:  PRINIVIL,ZESTRIL ONE TABLET BY MOUTH EACH DAY What changed:    how much to take  how to take this  when to take this  additional instructions   loperamide 2 MG capsule Commonly known as:  IMODIUM Take 2 tablets after first loose stool, then 1 tablet after each subsequent loose stool, maximum 8 tablets per day What changed:    how much to take  how to take this  when to take this  additional instructions   Magnesium 400 MG Tabs Take 400 mg by mouth 2 (two) times daily. For treatment of hypomagnesia   metoprolol succinate 25 MG 24 hr tablet Commonly known as:  TOPROL-XL Take 1 tablet (25 mg total) by mouth daily.   mirtazapine 7.5 MG tablet Commonly known as:  REMERON Take 1 tablet (7.5 mg total) by mouth at bedtime.   morphine 15 MG 12 hr tablet Commonly known as:  MS CONTIN Take 1 tablet (15 mg total) by mouth every 12 (twelve) hours for 7 days.   nitroGLYCERIN 0.2 mg/hr patch Commonly known as:  NITRODUR - Dosed in mg/24 hr Place 0.2 mg onto the skin daily. Apply one patch topically every day after removing old patch   omeprazole 20 MG capsule Commonly known as:  PRILOSEC Take 20 mg by mouth daily.   oxyCODONE 5 MG immediate release tablet Commonly known as:  Oxy IR/ROXICODONE Take 1 tablet (5 mg total) by mouth every 4 (four) hours as needed for severe pain. What changed:  how much to take   PARoxetine 20 MG tablet Commonly known as:  PAXIL Take 1 tablet (20 mg total) by mouth daily.   pentoxifylline 400 MG CR tablet Commonly known as:   TRENTAL Take 1 tablet (400 mg total) by mouth 3 (three) times daily with meals.   polyethylene glycol packet Commonly known as:  MIRALAX / GLYCOLAX Take 17 g by mouth 2 (two) times daily.   senna 8.6 MG Tabs tablet Commonly known as:  SENOKOT Take 1 tablet (8.6 mg total) by mouth at bedtime.      No Known Allergies    The results of significant diagnostics from this hospitalization (including imaging, microbiology, ancillary and laboratory) are listed below for reference.    Significant Diagnostic Studies: Mr Cervical Spine W Wo Contrast  Result Date: 07/31/2018 CLINICAL DATA:  C2 metastasis. Radiation treatment planning. History of hepatocellular carcinoma. EXAM: MRI CERVICAL SPINE WITHOUT AND WITH CONTRAST TECHNIQUE: Multiplanar and multiecho pulse sequences of the cervical spine, to include the craniocervical junction and cervicothoracic  junction, were obtained without and with intravenous contrast. CONTRAST:  25mL MULTIHANCE GADOBENATE DIMEGLUMINE 529 MG/ML IV SOLN COMPARISON:  CT cervical spine dated July 18, 2018. FINDINGS: Alignment: Physiologic. Vertebrae: Grossly unchanged 18 mm STIR hyperintense, T1 hypointense lesion in C2 right eccentric body extending into the dens and right posterior elements. There is thinning of the posterior cortex with cortical destruction better seen on recent CT. No extension into the epidural space. 9 mm STIR hyperintense, T1 hypointense lesion in the posterior aspect of the T2 vertebral body. No extension into the epidural space. No fracture or evidence of discitis. Cord: Focal cord edema and flattening at C5-C6. No intradural enhancement. Posterior Fossa, vertebral arteries, paraspinal tissues: Negative. Disc levels: C2-C3:  Negative. C3-C4: Posterior disc bulging and mild bilateral facet uncovertebral hypertrophy resulting in moderate central spinal canal stenosis with cord flattening. Severe right and moderate left neuroforaminal stenosis. C4-C5: Only  seen on the sagittal images. Minimal disc bulging. Mild bilateral uncovertebral hypertrophy. Severe right and mild left facet arthropathy. Mild spinal canal stenosis. Moderate right and mild left neuroforaminal stenosis. C5-C6: Only seen on the sagittal images. Posterior disc osteophyte complex and mild bilateral facet uncovertebral hypertrophy resulting in severe central spinal canal stenosis in severe bilateral neuroforaminal stenosis. C6-C7: Only seen on the sagittal images. Small posterior disc osteophyte complex and mild bilateral facet uncovertebral hypertrophy. No stenosis. C7-T1: Only seen on the sagittal images. Mild bilateral facet arthropathy. No stenosis. IMPRESSION: 1. Grossly unchanged 18 mm C2 metastasis without significant epidural extension. 2. Similar appearing 9 mm metastasis in the posterior aspect of the T2 vertebral body. 3. Multilevel degenerative changes of the cervical spine as described above with severe spinal canal stenosis at C5-C6 and cord edema/myelomalacia. Additional moderate central spinal canal stenosis at C3-C4. Electronically Signed   By: Titus Dubin M.D.   On: 07/31/2018 11:17   Mr Thoracic Spine W Wo Contrast  Addendum Date: 08/14/2018   ADDENDUM REPORT: 08/14/2018 14:39 ADDENDUM: Correction: MRI thoracic spine: FINDINGS: 12 mm lesion T2 (incorrectly reported as L2 within the thoracic section, however there is a lesion at L2 as well reported in MRI lumbar spine section). Electronically Signed   By: Elon Alas M.D.   On: 08/14/2018 14:39   Result Date: 08/14/2018 CLINICAL DATA:  Follow-up C2, T2 and L2 metastasis. History of multifocal hepatocellular carcinoma. EXAM: MRI THORACIC AND LUMBAR SPINE WITHOUT AND WITH CONTRAST TECHNIQUE: Multiplanar and multiecho pulse sequences of the thoracic and lumbar spine were obtained without and with intravenous contrast. CONTRAST:  5.5 cc Gadavist COMPARISON:  MRI cervical spine July 31, 2018, CT abdomen and pelvis  July 18, 2018 FINDINGS: MRI THORACIC SPINE FINDINGS ALIGNMENT: Maintenance of the thoracic kyphosis. No malalignment. VERTEBRAE/DISCS: Low T1, bright STIR signal and enhancement consistent with metastatic disease as follows: 12 mm lesion L2, 14 mm lesion T8-9, amorphous lesion LEFT T12 pedicle, 8 mm lesion RIGHT 12 pedicle. Subcentimeter T6 and T7 endplate set abnormal signal could reflect metastasis or Schmorl's node. No pathologic fracture. T11 butterfly vertebra. T8-9 interbody arthrodesis. Severe T4-5, T7-8 disc height loss with mild disc desiccation and multilevel mild chronic discogenic endplate changes, moderate T11-12. CORD: Thoracic spinal cord is normal morphology and signal characteristics. No abnormal cord or leptomeningeal enhancement. Trace abnormal LEFT ventral epidural enhancement T7-8 through T9-10 concerning for tumor. PREVERTEBRAL AND PARASPINAL SOFT TISSUES:  Nonacute. DISC LEVELS: Moderate T8-9 broad-based disc osteophyte complex and suspected epidural tumor resulting in mild canal stenosis. Facet arthropathy at T11-12 resulting in minimal canal stenosis.  Moderate LEFT T7-8 and T8-9 osseous neural foraminal narrowing. MRI LUMBAR SPINE FINDINGS SEGMENTATION: For the purposes of this report, the last well-formed intervertebral disc is reported as L5-S1. ALIGNMENT: Maintained lumbar lordosis. No malalignment. VERTEBRAE:Expansile low T1, bright STIR signal and enhancement L2 vertebral body, LEFT greater than RIGHT pedicles and spinous process with LEFT greater than RIGHT paraspinal muscle invasion. Enhancing metastasis superior aspect L3 spinous process. Additional 11 mm lesion RIGHT L1 vertebral body, subcentimeter metastasis L4. L1 inferior endplate Schmorl's node versus metastasis. Intact vertebral bodies. 11 mm enhancing lesion RIGHT sacrum consistent with metastasis. CONUS MEDULLARIS AND CAUDA EQUINA: Conus medullaris terminates at L1-2 and demonstrates normal morphology and signal  characteristics. Cauda equina is normal. PARASPINAL AND OTHER SOFT TISSUES: LEFT greater than RIGHT paraspinal muscle edema and enhancement.a L2 tumoral invasion into LEFT psoas muscle. DISC LEVELS: L1-2: Small broad-based disc bulge, mild facet arthropathy and ligamentum flavum redundancy. Mild canal stenosis. Mild-to-moderate RIGHT, mild LEFT neural foraminal narrowing. L2-3: Annular bulging. Mild facet arthropathy and ligamentum flavum redundancy. No canal stenosis. Tumoral invasion effaces LEFT neural foramen. L3-4: Moderate broad-based disc bulge, mild facet arthropathy and ligamentum flavum redundancy. Moderate canal stenosis. Mild bilateral neural foraminal narrowing. L4-5: Moderate broad-based disc bulge eccentric laterally. Mild facet arthropathy and ligamentum flavum redundancy. Mild-to-moderate canal stenosis. Moderate bilateral neural foraminal narrowing. Encroachment upon the exited L4 nerve. L5-S1: Moderate broad-based disc bulge asymmetric to LEFT, moderate facet arthropathy. No canal stenosis. Moderate to severe RIGHT and severe LEFT neural foraminal narrowing. IMPRESSION: MRI thoracic spine: 1. Multiple osseous metastasis. No pathologic fracture. Trace epidural tumor T7-8 through T9-10. 2. Mild canal stenosis T8-9. Moderate LEFT T7-8 and T8-9 neural foraminal narrowing. MRI lumbar spine: 1. Multiple osseous metastasis including large L2 lesion with cortical destruction and paraspinal muscle invasion. LEFT greater than RIGHT paraspinal muscle enhancement most compatible with denervation and tumor. 2. Tumoral invasion LEFT L2-3 neural foramen with complete effacement. 3. Moderate canal stenosis L3-4, mild to moderate canal stenosis L4-5. 4. Neural foraminal narrowing all lumbar levels: Moderate to severe on the RIGHT at L5-S1. Electronically Signed: By: Elon Alas M.D. On: 08/13/2018 16:27   Mr Lumbar Spine W Wo Contrast  Addendum Date: 08/14/2018   ADDENDUM REPORT: 08/14/2018 14:39  ADDENDUM: Correction: MRI thoracic spine: FINDINGS: 12 mm lesion T2 (incorrectly reported as L2 within the thoracic section, however there is a lesion at L2 as well reported in MRI lumbar spine section). Electronically Signed   By: Elon Alas M.D.   On: 08/14/2018 14:39   Result Date: 08/14/2018 CLINICAL DATA:  Follow-up C2, T2 and L2 metastasis. History of multifocal hepatocellular carcinoma. EXAM: MRI THORACIC AND LUMBAR SPINE WITHOUT AND WITH CONTRAST TECHNIQUE: Multiplanar and multiecho pulse sequences of the thoracic and lumbar spine were obtained without and with intravenous contrast. CONTRAST:  5.5 cc Gadavist COMPARISON:  MRI cervical spine July 31, 2018, CT abdomen and pelvis July 18, 2018 FINDINGS: MRI THORACIC SPINE FINDINGS ALIGNMENT: Maintenance of the thoracic kyphosis. No malalignment. VERTEBRAE/DISCS: Low T1, bright STIR signal and enhancement consistent with metastatic disease as follows: 12 mm lesion L2, 14 mm lesion T8-9, amorphous lesion LEFT T12 pedicle, 8 mm lesion RIGHT 12 pedicle. Subcentimeter T6 and T7 endplate set abnormal signal could reflect metastasis or Schmorl's node. No pathologic fracture. T11 butterfly vertebra. T8-9 interbody arthrodesis. Severe T4-5, T7-8 disc height loss with mild disc desiccation and multilevel mild chronic discogenic endplate changes, moderate T11-12. CORD: Thoracic spinal cord is normal morphology and signal characteristics. No abnormal cord or  leptomeningeal enhancement. Trace abnormal LEFT ventral epidural enhancement T7-8 through T9-10 concerning for tumor. PREVERTEBRAL AND PARASPINAL SOFT TISSUES:  Nonacute. DISC LEVELS: Moderate T8-9 broad-based disc osteophyte complex and suspected epidural tumor resulting in mild canal stenosis. Facet arthropathy at T11-12 resulting in minimal canal stenosis. Moderate LEFT T7-8 and T8-9 osseous neural foraminal narrowing. MRI LUMBAR SPINE FINDINGS SEGMENTATION: For the purposes of this report, the last  well-formed intervertebral disc is reported as L5-S1. ALIGNMENT: Maintained lumbar lordosis. No malalignment. VERTEBRAE:Expansile low T1, bright STIR signal and enhancement L2 vertebral body, LEFT greater than RIGHT pedicles and spinous process with LEFT greater than RIGHT paraspinal muscle invasion. Enhancing metastasis superior aspect L3 spinous process. Additional 11 mm lesion RIGHT L1 vertebral body, subcentimeter metastasis L4. L1 inferior endplate Schmorl's node versus metastasis. Intact vertebral bodies. 11 mm enhancing lesion RIGHT sacrum consistent with metastasis. CONUS MEDULLARIS AND CAUDA EQUINA: Conus medullaris terminates at L1-2 and demonstrates normal morphology and signal characteristics. Cauda equina is normal. PARASPINAL AND OTHER SOFT TISSUES: LEFT greater than RIGHT paraspinal muscle edema and enhancement.a L2 tumoral invasion into LEFT psoas muscle. DISC LEVELS: L1-2: Small broad-based disc bulge, mild facet arthropathy and ligamentum flavum redundancy. Mild canal stenosis. Mild-to-moderate RIGHT, mild LEFT neural foraminal narrowing. L2-3: Annular bulging. Mild facet arthropathy and ligamentum flavum redundancy. No canal stenosis. Tumoral invasion effaces LEFT neural foramen. L3-4: Moderate broad-based disc bulge, mild facet arthropathy and ligamentum flavum redundancy. Moderate canal stenosis. Mild bilateral neural foraminal narrowing. L4-5: Moderate broad-based disc bulge eccentric laterally. Mild facet arthropathy and ligamentum flavum redundancy. Mild-to-moderate canal stenosis. Moderate bilateral neural foraminal narrowing. Encroachment upon the exited L4 nerve. L5-S1: Moderate broad-based disc bulge asymmetric to LEFT, moderate facet arthropathy. No canal stenosis. Moderate to severe RIGHT and severe LEFT neural foraminal narrowing. IMPRESSION: MRI thoracic spine: 1. Multiple osseous metastasis. No pathologic fracture. Trace epidural tumor T7-8 through T9-10. 2. Mild canal stenosis T8-9.  Moderate LEFT T7-8 and T8-9 neural foraminal narrowing. MRI lumbar spine: 1. Multiple osseous metastasis including large L2 lesion with cortical destruction and paraspinal muscle invasion. LEFT greater than RIGHT paraspinal muscle enhancement most compatible with denervation and tumor. 2. Tumoral invasion LEFT L2-3 neural foramen with complete effacement. 3. Moderate canal stenosis L3-4, mild to moderate canal stenosis L4-5. 4. Neural foraminal narrowing all lumbar levels: Moderate to severe on the RIGHT at L5-S1. Electronically Signed: By: Elon Alas M.D. On: 08/13/2018 16:27   Nm Bone Scan Whole Body  Result Date: 08/10/2018 CLINICAL DATA:  62 year old male with hepatocellular carcinoma and bone metastases. C2 and L2 bone metastases demonstrated on prior CT, MRI. Staging. EXAM: NUCLEAR MEDICINE WHOLE BODY BONE SCAN TECHNIQUE: Whole body anterior and posterior images were obtained approximately 3 hours after intravenous injection of radiopharmaceutical. RADIOPHARMACEUTICALS:  19.4 mCi Technetium-67m MDP IV COMPARISON:  Cervical spine MRI 07/31/2018, CT Abdomen and Pelvis 2719, and earlier. FINDINGS: Expected radiotracer activity in both kidneys in the urinary bladder. Mild perineum contamination. Small nodular focus of uptake corresponding to the posterior left 9th rib fracture which has appeared benign on prior CTs. Similar small low to intermediate foci of activity along the anterior right ribs, probably also posttraumatic. No abnormal radiotracer activity in the cervical or thoracic vertebrae. There is increased activity in the upper lumbar spine which probably corresponds to the known spinous process metastasis. Homogeneous lower lumbar and pelvic radiotracer activity. Skull radiotracer activity is homogeneous. Relatively poor radiotracer uptake in the appendicular skeleton. Left total knee arthroplasty related uptake is evident. Degenerative appearing uptake in the  left hand. There is indeterminate  moderate radiotracer activity at the left proximal femur lesser trochanter. No lesion evident there by CT last month. IMPRESSION: 1. Nuclear medicine bone scan might not accurately reflect the burden of skeletal metastatic disease; the known C2 metastasis is not evident. 2. Radiotracer activity in the upper lumbar spine compatible with the known L2 metastasis. 3. Indeterminate activity in the proximal left femur, lesser trochanter. 4. Posttraumatic rib fracture related activity in the left 9th rib. Less pronounced similar activity in multiple anterior right ribs is probably also posttraumatic. Electronically Signed   By: Genevie Ann M.D.   On: 08/10/2018 16:15    Microbiology: Recent Results (from the past 240 hour(s))  Urine culture     Status: Abnormal   Collection Time: 08/21/18  2:56 PM  Result Value Ref Range Status   Specimen Description   Final    URINE, CLEAN CATCH Performed at [facility] [address], [place], [place] [address]    Special Requests   Final    NONE Performed at [facility], [place] [address], [place], [place] [address]    Culture MULTIPLE SPECIES PRESENT, SUGGEST RECOLLECTION (A)  Final   Report Status 08/23/2018 FINAL  Final  MRSA PCR Screening     Status: None   Collection Time: 08/22/18  7:12 PM  Result Value Ref Range Status   MRSA by PCR NEGATIVE NEGATIVE Final    Comment:        The GeneXpert MRSA Assay (FDA approved for NASAL specimens only), is one component of a comprehensive MRSA colonization surveillance program. It is not intended to diagnose MRSA infection nor to guide or monitor treatment for MRSA infections. Performed at [facility], [place] [address], [place], [place] [address]      Labs: Basic Metabolic Panel: Recent Labs  Lab 08/21/18 1456 08/22/18 1026 08/25/18 0347  NA 145 144 142  K 3.9 4.3 5.1  CL 104 106 101  CO2 28 27 34*  GLUCOSE 116* 161* 109*  BUN 23 22 48*   CREATININE 0.57* 0.59* 0.66  CALCIUM 9.6 9.5 9.6  MG  --  2.0  --   PHOS  --  3.6  --    Liver Function Tests: Recent Labs  Lab 08/21/18 1456 08/22/18 1026  AST 30 25  ALT 26 24  ALKPHOS 136* 128*  BILITOT 1.3* 1.4*  PROT 7.4 7.2  ALBUMIN 3.0* 2.9*   No results for input(s): LIPASE, AMYLASE in the last 168 hours. No results for input(s): AMMONIA in the last 168 hours. CBC: Recent Labs  Lab 08/21/18 1456 08/22/18 1026 08/25/18 0347  WBC 9.9 6.6 11.6*  NEUTROABS 6.7 5.7  --   HGB 14.2 14.4 14.2  HCT 44.4 44.8 46.0  MCV 85.7 85.2 87.5  PLT 210 218 237       Signed:  Oswald Hillock MD.  [facility] 08/25/2018, 11:56 AM
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
## 2018-08-25 NOTE — Telephone Encounter (Signed)
Scheduled apt per 10/4 sch message - pt is aware of apt date and time

## 2018-08-25 NOTE — Progress Notes (Signed)
Report called to Santiago Glad, Therapist, sports at [facility] facility. Pt transported via  [employer]

## 2018-08-25 NOTE — Telephone Encounter (Signed)
Per Patrica Duel to [place] at [facility] to inform them that patient should continue to take anagrelide and stop taking Hydrea. This information was given to Pt.'s Nurse at facility [facility].

## 2018-08-25 NOTE — Progress Notes (Signed)
Pt returning to [facility] at [facility] today. Sent DC summary and FL2 via the HUB- left voicemail for resident care director Santiago Glad to confirm receipt and coordinate pt returning. Once ready for DC will arrange transportation- report # [id number].  Sharren Bridge, MSW, LCSW Clinical Social Work 08/25/2018 [phone]

## 2018-08-28 ENCOUNTER — Ambulatory Visit
Admission: RE | Admit: 2018-08-28 | Discharge: 2018-08-28 | Disposition: A | Discharge status: Home / Self Care | Payer: Medicaid Other | Source: Ambulatory Visit | Attending: Radiation Oncology | Admitting: Radiation Oncology

## 2018-08-28 DIAGNOSIS — C7951 Secondary malignant neoplasm of bone: Secondary | ICD-10-CM | POA: Diagnosis not present

## 2018-08-28 DIAGNOSIS — Z510 Encounter for antineoplastic radiation therapy: Secondary | ICD-10-CM | POA: Diagnosis not present

## 2018-08-28 DIAGNOSIS — C220 Liver cell carcinoma: Secondary | ICD-10-CM | POA: Diagnosis not present

## 2018-08-29 ENCOUNTER — Ambulatory Visit: Payer: Self-pay | Admitting: Nurse Practitioner

## 2018-08-29 ENCOUNTER — Ambulatory Visit: Payer: Medicaid Other

## 2018-08-30 ENCOUNTER — Ambulatory Visit: Payer: Medicaid Other

## 2018-08-31 ENCOUNTER — Ambulatory Visit: Payer: Medicaid Other

## 2018-09-01 ENCOUNTER — Ambulatory Visit: Payer: Medicaid Other

## 2018-09-01 ENCOUNTER — Encounter: Payer: Self-pay | Admitting: Radiation Oncology

## 2018-09-04 ENCOUNTER — Ambulatory Visit: Payer: Medicaid Other

## 2018-09-05 ENCOUNTER — Ambulatory Visit: Payer: Medicaid Other

## 2018-09-06 ENCOUNTER — Ambulatory Visit: Payer: Medicaid Other

## 2018-09-06 ENCOUNTER — Ambulatory Visit: Admission: RE | Admit: 2018-09-06 | Payer: Medicaid Other | Source: Ambulatory Visit

## 2018-09-07 NOTE — Progress Notes (Signed)
  Radiation Oncology         (336) [phone] ________________________________  Name: Kyle Mejia MRN: [id number]  Date: 09/01/2018  DOB: 1956-02-08  End of Treatment Note  Diagnosis:   62 y.o. male with Progressive Metastatic Hepatocellular Carcinoma with disease in the cervical spine     Indication for treatment:  Palliative       Radiation treatment dates:   08/22/2018 - 08/28/2018  Site/dose:    1. Cervical Spine (C2) / 15 Gy in 5 fractions 2. Thoracic Spine (T9) / 15 Gy in 5 fractions 3. Lumbar Spine (L2) / 12 Gy in 4 fractions  Beams/energy:    1. 3D / 10X, 6X Photon 2. 3D / 15X, 10X Photon 3. 3D / 15X Photon  Narrative: The patient tolerated radiation treatment relatively well without significant issues or acute toxicity.  The patient was planned to receive 30 Gy in 10 fractions to each site but discontinued treatment upon transfer to hospice.  Plan: The patient has completed radiation treatment. I advised them to call or return if they have any questions or concerns related to their recovery or treatment.  ------------------------------------------------  Jodelle Gross, MD, PhD  This document serves as a record of services personally performed by Kyung Rudd, MD. It was created on his behalf by Rae Lips, a trained medical scribe. The creation of this record is based on the scribe's personal observations and the provider's statements to them. This document has been checked and approved by the attending provider.

## 2018-09-08 ENCOUNTER — Telehealth: Payer: Self-pay | Admitting: Oncology

## 2018-09-08 ENCOUNTER — Telehealth: Payer: Self-pay | Admitting: *Deleted

## 2018-09-08 NOTE — Telephone Encounter (Signed)
Called patient to try to reschedule missed appointments. Patient wife advised patient passed away on 2023-09-16. Message sent to MD and future appts cancelled.

## 2018-09-08 NOTE — Telephone Encounter (Signed)
Appt scheduled LMVM with date/time per 10/18 sch msg

## 2018-09-18 ENCOUNTER — Ambulatory Visit: Payer: Self-pay | Admitting: Nurse Practitioner

## 2018-09-22 DEATH — deceased

## 2018-09-27 ENCOUNTER — Ambulatory Visit: Payer: Self-pay | Admitting: Family Medicine

## 2018-10-09 ENCOUNTER — Ambulatory Visit: Payer: Self-pay | Admitting: Radiation Oncology

## 2018-10-14 NOTE — Progress Notes (Signed)
  Radiation Oncology         (336) [phone] ________________________________  Name: Kyle Mejia MRN: [id number]  Date: 08/17/2018  DOB: 04-09-1956  SIMULATION AND TREATMENT PLANNING NOTE  DIAGNOSIS:     ICD-10-CM   1. Bone metastases ([place]) C79.51      Site:   1.  C-spine 2.  T-spine 3.  L-spine  NARRATIVE:  The patient was brought to the [place].  Identity was confirmed.  All relevant records and images related to the planned course of therapy were reviewed.   Written consent to proceed with treatment was confirmed which was freely given after reviewing the details related to the planned course of therapy had been reviewed with the patient.  Then, the patient was set-up in a stable reproducible  supine position for radiation therapy.  CT images were obtained.  Surface markings were placed.    Medically necessary complex treatment device(s) for immobilization:   1.  Vac-Lok bag 2.  Accu form device.   The CT images were loaded into the planning software.  Then the target and avoidance structures were contoured.  Treatment planning then occurred.  The radiation prescription was entered and confirmed.  A total of 7 complex treatment devices were fabricated which relate to the designed radiation treatment fields. Each of these customized fields/ complex treatment devices will be used on a daily basis during the radiation course. I have requested : 3D Simulation  I have requested a DVH of the following structures: Target volume, left kidney, right kidney, spinal cord.   PLAN:  The patient will receive 30 Gy in 10 fractions.  ________________________________   Jodelle Gross, MD, PhD

## 2018-11-10 IMAGING — RF DG SWALLOWING FUNCTION - NRPT [facility]
11 series · 24 of 24 positions shown · non-contrast
Comparison: none

[Series 1: cp_standard · 0.34mm/px · 2 of 134 frames shown (1 of 11)]
[frame 21/134]
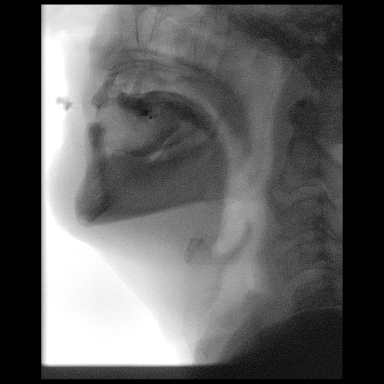
[frame 114/134]
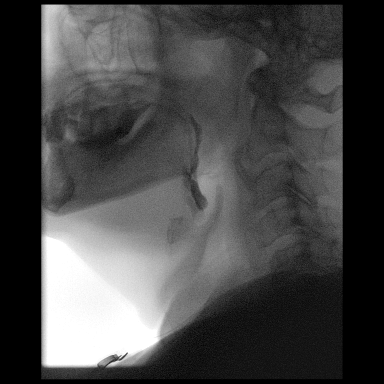

[Series 2: cp_standard · 0.51mm/px · 3 of 291 frames shown (2 of 11)]
[frame 32/291]
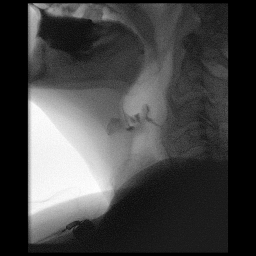
[frame 146/291]
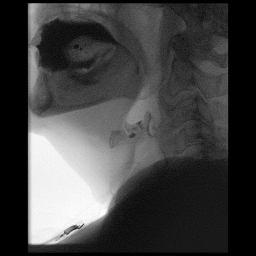
[frame 248/291]
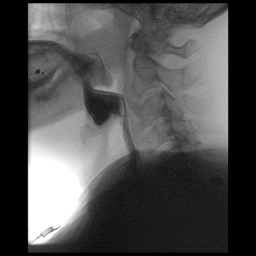

[Series 3: cp_standard · 0.51mm/px · 2 of 75 frames shown (3 of 11)]
[frame 12/75]
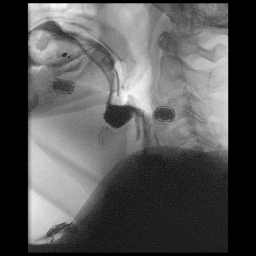
[frame 64/75]
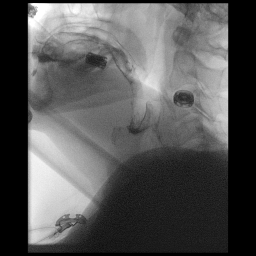

[Series 4: cp_standard · 0.51mm/px · 2 of 110 frames shown (4 of 11)]
[frame 29/110]
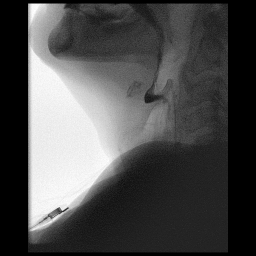
[frame 94/110]
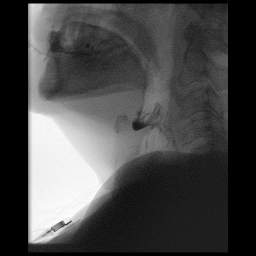

[Series 5: cp_standard · 0.51mm/px · 2 of 110 frames shown (5 of 11)]
[frame 17/110]
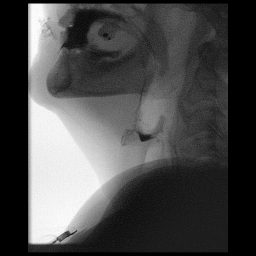
[frame 94/110]
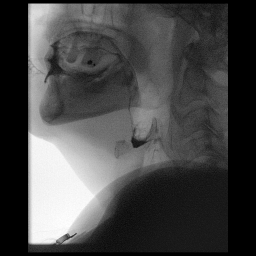

[Series 6: cp_standard · 0.51mm/px · 2 of 103 frames shown (6 of 11)]
[frame 16/103]
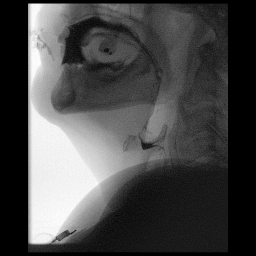
[frame 52/103]
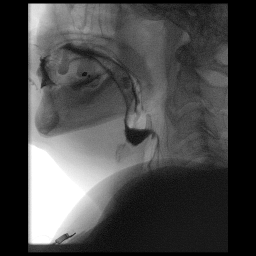

[Series 7: cp_standard · 0.51mm/px · 2 of 218 frames shown (7 of 11)]
[frame 25/218]
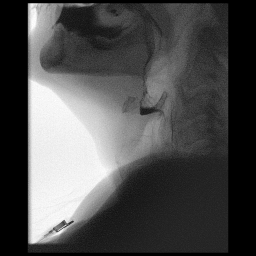
[frame 110/218]
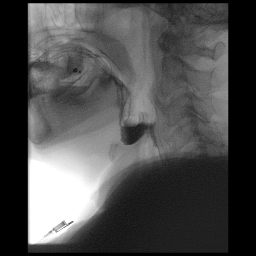

[Series 8: cp_standard · 0.51mm/px · 2 of 19 frames shown (8 of 11)]
[frame 2/19]
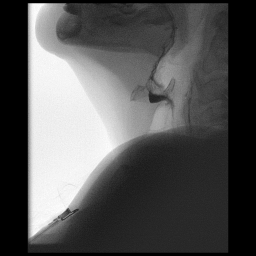
[frame 10/19]
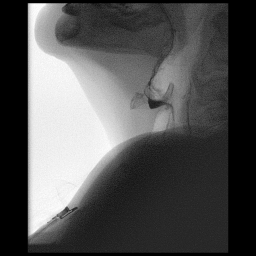

[Series 9: cp_standard · 0.51mm/px · 2 of 71 frames shown (9 of 11)]
[frame 1/71]
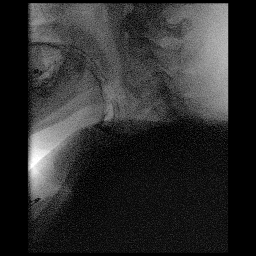
[frame 36/71]
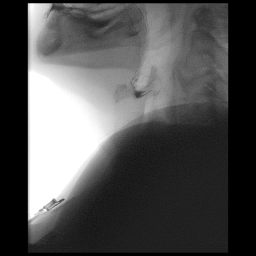

[Series 10: cp_standard · 0.51mm/px · 3 of 84 frames shown (10 of 11)]
[frame 2/84]
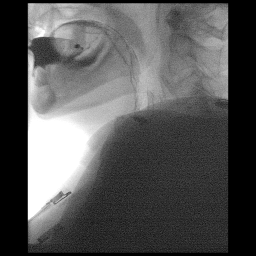
[frame 13/84]
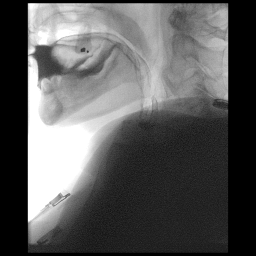
[frame 72/84]
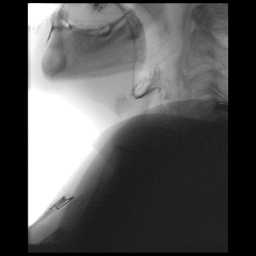

[Series 11: cp_standard · 0.51mm/px · 2 of 124 frames shown (11 of 11)]
[frame 19/124]
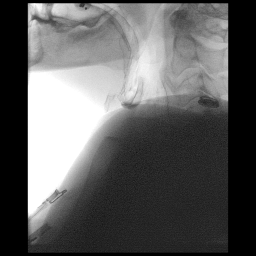
[frame 106/124]
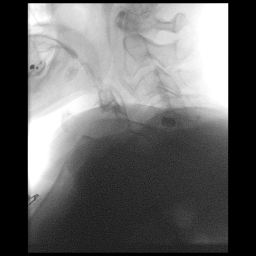

[24 of 24 positions shown; findings below may reference images not displayed]

FLUOROSCOPY FOR SWALLOWING FUNCTION STUDY:
Fluoroscopy was provided for swallowing function study, which was administered by a speech pathologist.  Final results and recommendations from this study are contained within the speech pathology report.
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
## 2019-03-29 NOTE — Telephone Encounter (Signed)
Message sent to provider 

## 2023-04-11 ENCOUNTER — Other Ambulatory Visit: Payer: Self-pay
